# Patient Record
Sex: Female | Born: 1980 | Race: White | Hispanic: No | Marital: Single | Smoking: Never smoker
Health system: Southern US, Community
[De-identification: ages and names within clinical notes are randomized; demographics above are authoritative.]

## PROBLEM LIST (undated history)

## (undated) DIAGNOSIS — E039 Hypothyroidism, unspecified: Secondary | ICD-10-CM

## (undated) DIAGNOSIS — Z973 Presence of spectacles and contact lenses: Secondary | ICD-10-CM

## (undated) DIAGNOSIS — Z87410 Personal history of cervical dysplasia: Secondary | ICD-10-CM

## (undated) DIAGNOSIS — N939 Abnormal uterine and vaginal bleeding, unspecified: Secondary | ICD-10-CM

## (undated) DIAGNOSIS — T7840XA Allergy, unspecified, initial encounter: Secondary | ICD-10-CM

## (undated) DIAGNOSIS — J309 Allergic rhinitis, unspecified: Secondary | ICD-10-CM

## (undated) DIAGNOSIS — Z8619 Personal history of other infectious and parasitic diseases: Secondary | ICD-10-CM

## (undated) DIAGNOSIS — R87619 Unspecified abnormal cytological findings in specimens from cervix uteri: Secondary | ICD-10-CM

## (undated) HISTORY — PX: LYMPH NODE DISSECTION: SHX5087

## (undated) HISTORY — DX: Unspecified abnormal cytological findings in specimens from cervix uteri: R87.619

## (undated) HISTORY — PX: TONSILLECTOMY: SUR1361

## (undated) HISTORY — PX: COLPOSCOPY: SHX161

## (undated) HISTORY — PX: BREAST REDUCTION SURGERY: SHX8

## (undated) HISTORY — DX: Hypothyroidism, unspecified: E03.9

## (undated) HISTORY — PX: ADENOIDECTOMY: SUR15

## (undated) HISTORY — DX: Personal history of other infectious and parasitic diseases: Z86.19

## (undated) HISTORY — DX: Allergy, unspecified, initial encounter: T78.40XA

---
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
## 1999-12-06 HISTORY — PX: LYMPH NODE DISSECTION: SHX5087

## 2002-12-05 HISTORY — PX: CERVICAL BIOPSY  W/ LOOP ELECTRODE EXCISION: SUR135

## 2011-05-07 ENCOUNTER — Ambulatory Visit
Admission: RE | Admit: 2011-05-07 | Discharge: 2011-05-07 | Disposition: A | Discharge status: Home / Self Care | Payer: Commercial Managed Care - PPO | Source: Ambulatory Visit | Attending: Emergency Medicine | Admitting: Emergency Medicine

## 2011-05-07 ENCOUNTER — Other Ambulatory Visit: Payer: Self-pay | Admitting: Emergency Medicine

## 2011-05-07 ENCOUNTER — Inpatient Hospital Stay (INDEPENDENT_AMBULATORY_CARE_PROVIDER_SITE_OTHER)
Admission: RE | Admit: 2011-05-07 | Discharge: 2011-05-07 | Disposition: A | Discharge status: Home / Self Care | Payer: Commercial Managed Care - PPO | Source: Ambulatory Visit | Attending: Emergency Medicine | Admitting: Emergency Medicine

## 2011-05-07 ENCOUNTER — Encounter: Payer: Self-pay | Admitting: Emergency Medicine

## 2011-05-07 DIAGNOSIS — M79609 Pain in unspecified limb: Secondary | ICD-10-CM

## 2011-05-07 DIAGNOSIS — J45909 Unspecified asthma, uncomplicated: Secondary | ICD-10-CM | POA: Insufficient documentation

## 2011-11-07 NOTE — Progress Notes (Signed)
Summary: FOOT PAIN(rm2)   Vital Signs:  Patient Profile:   30 Years Old Female CC:      foot pain O2 treatment:    Room Air (left arm) Cuff size:   regular  Vitals Entered By: Linton Flemings RN (May 07, 2011 3:36 PM)                  Updated Prior Medication List: VENLAFAXINE HCL 37.5 MG TABS (VENLAFAXINE HCL) daily SPRINTEC 28 0.25-35 MG-MCG TABS (NORGESTIMATE-ETH ESTRADIOL) daily ALBUTEROL SULFATE (2.5 MG/3ML) 0.083% NEBU (ALBUTEROL SULFATE) as needed  Current Allergies: ! PCNsHistory of Present Illness History from: patient Chief Complaint: foot pain History of Present Illness: Was walking about 4-5 days on an uneven surface and twisted her foot.  Still has pain and was wanting an Xray.  Pain located on top and outside of her foot.  Perhaps small amount of swelling.  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 Using ibuprofen which is helping.  REVIEW OF SYSTEMS Constitutional Symptoms      Denies fever, chills, night sweats, weight loss, weight gain, and fatigue.  Eyes       Denies change in vision, eye pain, eye discharge, glasses, contact lenses, and eye surgery. Ear/Nose/Throat/Mouth       Denies hearing loss/aids, change in hearing, ear pain, ear discharge, dizziness, frequent runny nose, frequent nose bleeds, sinus problems, sore throat, hoarseness, and tooth pain or bleeding.  Respiratory       Denies dry cough, productive cough, wheezing, shortness of breath, asthma, bronchitis, and emphysema/COPD.  Cardiovascular       Denies murmurs, chest pain, and tires easily with exhertion.    Gastrointestinal       Denies stomach pain, nausea/vomiting, diarrhea, constipation, blood in bowel movements, and indigestion. Genitourniary       Denies painful urination, kidney stones, and loss of urinary control. Neurological       Denies paralysis, seizures, and fainting/blackouts. Musculoskeletal       Complains of muscle pain and joint pain.      Denies  joint stiffness, decreased range of motion, redness, swelling, muscle weakness, and gout.      Comments: foot pain Skin       Denies bruising, unusual mles/lumps or sores, and hair/skin or nail changes.  Psych       Denies mood changes, temper/anger issues, anxiety/stress, speech problems, depression, and sleep problems. Other Comments: twisted foot on Tuesday   Past History:  Past Medical History: Asthma  Past Surgical History: T&A wisdom teeth breast reduction lymphnoid removed  Family History: Family History Hypertension  Social History: smoke-no alcohol-yes rec.drugs-no Physical Exam General appearance: well developed, well nourished, no acute distress MSE: oriented to time, place, and person L foot/ ankle: FROM, full strength, resisted motions not painful.  No TTP medial/lateral malleolus, navicular, calcaneus, Achilles, or proximal fibula.  +TTP base of 5th and prox 3rd and 4th MT.  No swelling.  No ecchymoses.   Distal NV status intact. Assessment New Problems: ASTHMA (ICD-493.90) FOOT PAIN (ICD-729.5)   Plan New Orders: New Patient Level III [99203] T-DG Foot Complete*L* [73630] Planning Comments:   Xray obtained and read by radiology as normal.  Encourage rest, elevation, Ibuprofen as needed, ice.  Should use firm-soled shoe or boot (she already has one).  Should gradually be getting better over the next 1-2 weeks.  If not, then should schedule appt with sports medicine or ortho.   The patient and/or  caregiver has been counseled thoroughly with regard to medications prescribed including dosage, schedule, interactions, rationale for use, and possible side effects and they verbalize understanding.  Diagnoses and expected course of recovery discussed and will return if not improved as expected or if the condition worsens. Patient and/or caregiver verbalized understanding.   Orders Added: 1)  New Patient Level III [99203] 2)  T-DG Foot Complete*L* [40981]

## 2011-11-25 ENCOUNTER — Encounter: Payer: Self-pay | Admitting: *Deleted

## 2011-11-25 ENCOUNTER — Emergency Department
Admission: EM | Admit: 2011-11-25 | Discharge: 2011-11-25 | Disposition: A | Discharge status: Home / Self Care | Payer: Managed Care, Other (non HMO) | Source: Home / Self Care | Attending: Emergency Medicine | Admitting: Emergency Medicine

## 2011-11-25 DIAGNOSIS — J069 Acute upper respiratory infection, unspecified: Secondary | ICD-10-CM

## 2011-11-25 DIAGNOSIS — J029 Acute pharyngitis, unspecified: Secondary | ICD-10-CM

## 2011-11-25 LAB — POCT RAPID STREP A (OFFICE): Rapid Strep A Screen: NEGATIVE

## 2011-11-25 MED ORDER — AZITHROMYCIN 250 MG PO TABS: ORAL_TABLET | ORAL | Status: AC

## 2011-11-25 NOTE — ED Notes (Signed)
Patient c/o body aches, dry cough, HA, chills, and ear pain x 3 days. She had a flu shot 3 weeks ago.

## 2011-11-25 NOTE — ED Provider Notes (Signed)
History     CSN: [id number]  Arrival date & time 11/25/11  9147   First MD Initiated Contact with Patient 11/25/11 1838      No chief complaint on file.   (Consider location/radiation/quality/duration/timing/severity/associated sxs/prior treatment) HPI Tara Rosales is a 30 y.o. female who complains of onset of cold symptoms for 3 days. She has had the flu shot.  +sore throat +cough No pleuritic pain No wheezing + nasal congestion + post-nasal drainage + sinus pain/pressure No chest congestion No itchy/red eyes No earache No hemoptysis No SOB No chills/sweats No fever No nausea No vomiting No abdominal pain No diarrhea No skin rashes + fatigue No myalgias + headache    No past medical history on file.  No past surgical history on file.  No family history on file.  History  Substance Use Topics  . Smoking status: Not on file  . Smokeless tobacco: Not on file  . Alcohol Use: Not on file    OB History    No data available      Review of Systems  Allergies  Penicillins  Home Medications  No current outpatient prescriptions on file.  There were no vitals taken for this visit.  Physical Exam  Nursing note and vitals reviewed. Constitutional: She is oriented to person, place, and time. She appears well-developed and well-nourished.  HENT:  Head: Normocephalic and atraumatic.  Right Ear: Tympanic membrane, external ear and ear canal normal.  Left Ear: Tympanic membrane, external ear and ear canal normal.  Nose: Mucosal edema and rhinorrhea present.  Mouth/Throat: Posterior oropharyngeal erythema present. No oropharyngeal exudate or posterior oropharyngeal edema.  Eyes: No scleral icterus.  Neck: Neck supple.  Cardiovascular: Regular rhythm and normal heart sounds.   Pulmonary/Chest: Effort normal and breath sounds normal. No respiratory distress.  Neurological: She is alert and oriented to person, place, and time.  Skin: Skin is warm and dry.    Psychiatric: She has a normal mood and affect. Her speech is normal.    ED Course  Procedures (including critical care time)  Labs Reviewed - No data to display No results found.   No diagnosis found.    MDM  1)  I do not feel this is flu like. However this is most likely viral. The rapid strep test is negative. I did give her a prescription for Z-Pak but I told her to hold her for a couple days since is she will likely get better anyway just symptomatic treatment. 2)  Use nasal saline solution (over the counter) at least 3 times a day. 3)  Use over the counter decongestants like Zyrtec-D every 12 hours as needed to help with congestion.  If you have hypertension, do not take medicines with sudafed.  4)  Can take tylenol every 6 hours or motrin every 8 hours for pain or fever. 5)  Follow up with your primary doctor if no improvement in 5-7 days, sooner if increasing pain, fever, or new symptoms.        Lily Kocher, MD 11/25/11 [phone]

## 2012-06-15 ENCOUNTER — Emergency Department
Admission: EM | Admit: 2012-06-15 | Discharge: 2012-06-15 | Disposition: A | Discharge status: Home / Self Care | Payer: Managed Care, Other (non HMO) | Source: Home / Self Care

## 2012-06-15 ENCOUNTER — Emergency Department (INDEPENDENT_AMBULATORY_CARE_PROVIDER_SITE_OTHER): Payer: Managed Care, Other (non HMO)

## 2012-06-15 ENCOUNTER — Encounter: Payer: Self-pay | Admitting: Emergency Medicine

## 2012-06-15 ENCOUNTER — Emergency Department
Admission: EM | Admit: 2012-06-15 | Discharge: 2012-06-15 | Discharge status: Absent without leave | Payer: Self-pay | Source: Home / Self Care

## 2012-06-15 DIAGNOSIS — M25579 Pain in unspecified ankle and joints of unspecified foot: Secondary | ICD-10-CM

## 2012-06-15 DIAGNOSIS — S93409A Sprain of unspecified ligament of unspecified ankle, initial encounter: Secondary | ICD-10-CM

## 2012-06-15 DIAGNOSIS — M79609 Pain in unspecified limb: Secondary | ICD-10-CM

## 2012-06-15 DIAGNOSIS — M79673 Pain in unspecified foot: Secondary | ICD-10-CM

## 2012-06-15 NOTE — ED Provider Notes (Signed)
History     CSN: [id number]  Arrival date & time 06/15/12  1552   First MD Initiated Contact with Patient 06/15/12 1614      Chief Complaint  Patient presents with  . Foot Pain   Patient is a 31 y.o. female presenting with lower extremity pain. The history is provided by the patient.  Foot Pain This is a new problem. Episode onset: 2 days ago: Pt was jogging along uneven path.. Pt had repetitve episodes of rolling ankle along uneven pavement.  Had significant ankle and foot pai afterwards. Has had lateral ankle/foot pain since this point with swelling. Has been able to bear weight.  The problem has not changed since onset.The symptoms are aggravated by standing. The symptoms are relieved by ice and NSAIDs. The treatment provided mild relief.    History reviewed. No pertinent past medical history.  Past Surgical History  Procedure Date  . Tonsillectomy   . Breast reduction surgery     No family history on file.  History  Substance Use Topics  . Smoking status: Never Smoker   . Smokeless tobacco: Not on file  . Alcohol Use: Yes    OB History    Grav Para Term Preterm Abortions TAB SAB Ect Mult Living                  Review of Systems  All other systems reviewed and are negative.    Allergies  Penicillins and Sulfur  Home Medications   Current Outpatient Rx  Name Route Sig Dispense Refill  . NORETHINDRON-ETHINYL ESTRAD-FE 1-20/1-30/1-35 MG-MCG PO TABS Oral Take 1 tablet by mouth daily.        BP 110/74  Pulse 78  Temp 98.1 F (36.7 C) (Oral)  Resp 16  Ht 5\' 8"  (1.727 m)  Wt 180 lb (81.647 kg)  BMI 27.37 kg/m2  SpO2 100%  LMP 05/28/2012  Physical Exam  Constitutional: She appears well-developed and well-nourished.  HENT:  Head: Normocephalic and atraumatic.  Eyes: Conjunctivae are normal. Pupils are equal, round, and reactive to light.  Neck: Normal range of motion. Neck supple.  Cardiovascular: Normal rate.   Musculoskeletal:       Ankle: No  visible erythema or swelling. Decreased ROM with resisted ankle eversion Strength is 5/5 in all directions. Stable lateral and medial ligaments; squeeze test and kleiger test unremarkable;  Talar dome mildly tender.  No pain at base of 5th MT; No tenderness over cuboid; No tenderness over N spot or navicular prominence + tenderness of lateral malleolus + mild tenderness over aspect of 4th-5th metatarsal No sign of peroneal tendon subluxations; Negative tarsal tunnel tinel's Able to walk 4 steps.     ED Course  Procedures (including critical care time)  Labs Reviewed - No data to display Dg Ankle Complete Left  06/15/2012  *RADIOLOGY REPORT*  Clinical Data: Pain in the left ankle for 2 days  LEFT ANKLE COMPLETE - 3+ VIEW  Comparison: None.  Findings: The ankle joint appears normal.  Alignment is normal.  No fracture is seen.  IMPRESSION: Negative.  Original Report Authenticated By: Juline Patch, M.D.   Dg Foot Complete Left  06/15/2012  *RADIOLOGY REPORT*  Clinical Data: Lateral left foot pain.  LEFT FOOT - COMPLETE 3+ VIEW  Comparison: 05/07/2011.  Findings: No acute osseous or joint abnormality.  IMPRESSION: No acute osseous or joint abnormality.  Original Report Authenticated By: Reyes Ivan, M.D.     No diagnosis found.  MDM  L mid ankle sprain. L foot pain.   Xrays negative for fracture.  RICE, NSAIDs.  Ankle brace and post op shoe.  Discussed general red flags and avoid prolonged standing (pt works as Producer, television/film/video).  Handout given.  Follow up as needed.     The patient and/or caregiver has been counseled thoroughly with regard to treatment plan and/or medications prescribed including dosage, schedule, interactions, rationale for use, and possible side effects and they verbalize understanding. Diagnoses and expected course of recovery discussed and will return if not improved as expected or if the condition worsens. Patient and/or caregiver verbalized  understanding.             Floydene Flock, MD 06/15/12 1713  Floydene Flock, MD 06/15/12 1714

## 2012-06-15 NOTE — ED Notes (Signed)
Twisted left foot while running 3 days ago and has experienced increase in pain upon weight bearing and some edema.

## 2012-06-15 NOTE — ED Provider Notes (Signed)
Agree with exam, assessment, and plan.   Lattie Haw, MD 06/15/12 2234

## 2012-07-18 ENCOUNTER — Encounter: Payer: Self-pay | Admitting: Emergency Medicine

## 2012-07-18 ENCOUNTER — Emergency Department
Admission: EM | Admit: 2012-07-18 | Discharge: 2012-07-18 | Disposition: A | Discharge status: Home / Self Care | Payer: Managed Care, Other (non HMO) | Source: Home / Self Care | Attending: Family Medicine | Admitting: Family Medicine

## 2012-07-18 ENCOUNTER — Emergency Department (INDEPENDENT_AMBULATORY_CARE_PROVIDER_SITE_OTHER): Payer: Managed Care, Other (non HMO)

## 2012-07-18 DIAGNOSIS — M79609 Pain in unspecified limb: Secondary | ICD-10-CM

## 2012-07-18 DIAGNOSIS — M25569 Pain in unspecified knee: Secondary | ICD-10-CM

## 2012-07-18 DIAGNOSIS — S8392XA Sprain of unspecified site of left knee, initial encounter: Secondary | ICD-10-CM

## 2012-07-18 DIAGNOSIS — IMO0002 Reserved for concepts with insufficient information to code with codable children: Secondary | ICD-10-CM

## 2012-07-18 DIAGNOSIS — S8990XA Unspecified injury of unspecified lower leg, initial encounter: Secondary | ICD-10-CM

## 2012-07-18 DIAGNOSIS — W19XXXA Unspecified fall, initial encounter: Secondary | ICD-10-CM

## 2012-07-18 DIAGNOSIS — M79652 Pain in left thigh: Secondary | ICD-10-CM

## 2012-07-18 MED ORDER — HYDROCODONE-ACETAMINOPHEN 5-300 MG PO TABS: ORAL_TABLET | ORAL | Status: DC

## 2012-07-18 NOTE — ED Notes (Signed)
Left leg injury x 4 days ago slipped on ladder getting into pool

## 2012-07-18 NOTE — ED Provider Notes (Signed)
History     CSN: [id number]  Arrival date & time 07/18/12  1116   First MD Initiated Contact with Patient 07/18/12 1133      Chief Complaint  Patient presents with  . Leg Injury     HPI Comments: Left leg injury x 4 days ago slipped on ladder getting into pool.  Patient reports persistent pain and swelling in her left lateral thigh and knee.  No lower leg pain below the knee.  She notes pain with standing and ambulation.  Patient is a 31 y.o. female presenting with leg pain. The history is provided by the patient.  Leg Pain  Incident onset: 4 days ago. The incident occurred at the pool. The injury mechanism was a fall. The pain is present in the left knee and left leg. The quality of the pain is described as throbbing. The pain is at a severity of 7/10. The pain is moderate. The pain has been constant since onset. Pertinent negatives include no numbness, no inability to bear weight, no loss of motion, no muscle weakness, no loss of sensation and no tingling. The symptoms are aggravated by bearing weight. She has tried NSAIDs for the symptoms. The treatment provided mild relief.    History reviewed. No pertinent past medical history.  Past Surgical History  Procedure Date  . Tonsillectomy   . Breast reduction surgery     History reviewed. No pertinent family history.  History  Substance Use Topics  . Smoking status: Never Smoker   . Smokeless tobacco: Not on file  . Alcohol Use: Yes    OB History    Grav Para Term Preterm Abortions TAB SAB Ect Mult Living                  Review of Systems  Neurological: Negative for tingling and numbness.  All other systems reviewed and are negative.    Allergies  Penicillins and Sulfur  Home Medications   Current Outpatient Rx  Name Route Sig Dispense Refill  . HYDROCODONE-ACETAMINOPHEN 5-300 MG PO TABS  Take one tab by mouth at bedtime PRN pain 10 each 0  . NORETHINDRON-ETHINYL ESTRAD-FE 1-20/1-30/1-35 MG-MCG PO TABS Oral  Take 1 tablet by mouth daily.        BP 125/84  Pulse 85  Temp 98.4 F (36.9 C) (Oral)  Resp 14  Ht 5\' 8"  (1.727 m)  Wt 198 lb (89.812 kg)  BMI 30.11 kg/m2  SpO2 98%  LMP 07/03/2012  Physical Exam  Nursing note and vitals reviewed. Constitutional: She is oriented to person, place, and time. She appears well-developed and well-nourished. No distress.  Eyes: Pupils are equal, round, and reactive to light.  Cardiovascular: Normal heart sounds.   Pulmonary/Chest: Breath sounds normal.  Musculoskeletal: She exhibits tenderness. She exhibits no edema.       Left knee: She exhibits decreased range of motion, ecchymosis and bony tenderness. She exhibits no swelling, no effusion, no deformity, no laceration, no erythema, normal alignment, no LCL laxity, normal patellar mobility, normal meniscus and no MCL laxity. tenderness found. Lateral joint line and LCL tenderness noted. No medial joint line, no MCL and no patellar tendon tenderness noted.       Legs:      As noted on diagram, there is superficial tenderness and superficial ecchymosis over the left lateral distal quadriceps area.  Tenderness over left patella and left lateral joint line.  No calf tenderness or swelling.  Negative McMurray test and drawer test left  knee.  Distal Neurovascular function is intact.   Neurological: She is alert and oriented to person, place, and time.  Skin: Skin is warm and dry. No rash noted. No erythema.    ED Course  Procedures  none  Labs Reviewed - No data to display Dg Knee Complete 4 Views Left  07/18/2012  *RADIOLOGY REPORT*  Clinical Data: Injury 3 days ago.  Pain.  LEFT KNEE - COMPLETE 4+ VIEW  Comparison: None.  Findings: No fracture or dislocation.  IMPRESSION: No fracture.  Original Report Authenticated By: Fuller Canada, M.D.     1. Left thigh pain/contusion  2. Left knee sprain, non-specific       MDM   Ace wrap applied.  Vicodin for pain at night. Use crutches for about 5 days.   Wear ace wrap until swelling decreases.  Apply ice pack several times daily.  Elevate leg.  Take Ibuprofen 200mg , 4 tabs every 8 hours with food.  Begin exercises as per instruction sheet ([facility] information and instruction handout given) Red flags discussed (i.e. Increasing pain, swelling, heat:  Possible DVT)  Followup with [facility] Medicine Clinic if not improving about two weeks.         Lattie Haw, MD 07/18/12 [phone]

## 2012-07-21 ENCOUNTER — Telehealth: Payer: Self-pay

## 2012-07-21 NOTE — ED Notes (Signed)
Left a message on voice mail asking how patient is feeling and advising to call back with any questions or concerns.  

## 2012-12-03 ENCOUNTER — Emergency Department
Admission: EM | Admit: 2012-12-03 | Discharge: 2012-12-03 | Disposition: A | Discharge status: Home / Self Care | Payer: Managed Care, Other (non HMO) | Source: Home / Self Care | Attending: Family Medicine | Admitting: Family Medicine

## 2012-12-03 ENCOUNTER — Encounter: Payer: Self-pay | Admitting: Emergency Medicine

## 2012-12-03 DIAGNOSIS — J029 Acute pharyngitis, unspecified: Secondary | ICD-10-CM

## 2012-12-03 DIAGNOSIS — J069 Acute upper respiratory infection, unspecified: Secondary | ICD-10-CM

## 2012-12-03 LAB — POCT RAPID STREP A (OFFICE): Rapid Strep A Screen: NEGATIVE

## 2012-12-03 MED ORDER — AZITHROMYCIN 250 MG PO TABS: ORAL_TABLET | ORAL | Status: DC

## 2012-12-03 MED ORDER — PREDNISONE 20 MG PO TABS: 20.0000 mg | ORAL_TABLET | Freq: Two times a day (BID) | ORAL | Status: DC

## 2012-12-03 MED ORDER — BENZONATATE 200 MG PO CAPS: 200.0000 mg | ORAL_CAPSULE | Freq: Every day | ORAL | Status: DC

## 2012-12-03 NOTE — ED Notes (Signed)
Sore throat, ears hurt x 1 day

## 2012-12-03 NOTE — ED Provider Notes (Signed)
History     CSN: [id number]  Arrival date & time 12/03/12  1029   First MD Initiated Contact with Patient 12/03/12 1119      Chief Complaint  Patient presents with  . Sore Throat     HPI Comments: Patient states that she has been fatigued for three days, and last night developed a sore throat, worse this morning.  She has had mild myalgias.  She normally has sinus congestion year round, now somewhat increased with post-nasal drainage.  She has developed a mild left earache and noted small amount of drainage from her left ear.  No cough. She states that she has had multiple ear infections in the past.  The history is provided by the patient.    History reviewed. No pertinent past medical history.  Past Surgical History  Procedure Date  . Tonsillectomy   . Breast reduction surgery     No family history on file.  History  Substance Use Topics  . Smoking status: Never Smoker   . Smokeless tobacco: Not on file  . Alcohol Use: Yes    OB History    Grav Para Term Preterm Abortions TAB SAB Ect Mult Living                  Review of Systems + sore throat + cough No pleuritic pain No wheezing + nasal congestion + post-nasal drainage No sinus pain/pressure No itchy/red eyes + left earache with drainage from left ear No hemoptysis No SOB No fever, + chills No nausea No vomiting No abdominal pain No diarrhea No urinary symptoms No skin rashes + fatigue No myalgias + headache Used OTC meds without relief  Allergies  Penicillins and Sulfur  Home Medications   Current Outpatient Rx  Name  Route  Sig  Dispense  Refill  . AZITHROMYCIN 250 MG PO TABS      Take 2 tabs today; then begin one tab once daily for 4 more days. (Rx void after 12/11/12)   6 each   0   . BENZONATATE 200 MG PO CAPS   Oral   Take 1 capsule (200 mg total) by mouth at bedtime. Take as needed for cough   12 capsule   0   . HYDROCODONE-ACETAMINOPHEN 5-300 MG PO TABS      Take one tab  by mouth at bedtime PRN pain   10 each   0   . NORETHINDRON-ETHINYL ESTRAD-FE 1-20/1-30/1-35 MG-MCG PO TABS   Oral   Take 1 tablet by mouth daily.           Marland Kitchen PREDNISONE 20 MG PO TABS   Oral   Take 1 tablet (20 mg total) by mouth 2 (two) times daily.   10 tablet   0     BP 130/81  Pulse 78  Temp 98.4 F (36.9 C) (Oral)  Resp 14  Ht 5\' 8"  (1.727 m)  Wt 215 lb (97.523 kg)  BMI 32.69 kg/m2  SpO2 97%  LMP 11/30/2012  Physical Exam Nursing notes and Vital Signs reviewed. Appearance:  Patient appears stated age, and in no acute distress.  Patient is obese (BMI 32.7) Eyes:  Pupils are equal, round, and reactive to light and accomodation.  Extraocular movement is intact.  Conjunctivae are not inflamed  Ears:  Canals normal.  Tympanic membranes normal.  Nose:  Mildly congested turbinates.  No sinus tenderness.   Pharynx:   Minimal erythema Neck:  Supple.  Slightly tender shotty posterior nodes are  palpated bilaterally  Lungs:  Clear to auscultation.  Breath sounds are equal. Chest:  Distinct tenderness to palpation over the mid-sternum.   Heart:  Regular rate and rhythm without murmurs, rubs, or gallops.  Abdomen:  Nontender without masses or hepatosplenomegaly.  Bowel sounds are present.  No CVA or flank tenderness.  Extremities:  No edema.  No calf tenderness Skin:  No rash present.   ED Course  Procedures none   Labs Reviewed  POCT RAPID STREP A (OFFICE) negative      1. Acute pharyngitis   2. Acute upper respiratory infections of unspecified site; suspect viral URI       MDM  There is no evidence of bacterial infection today.  Treat symptomatically for now  Begin prednisone burst.  Prescription written for Benzonatate (Tessalon) to take at bedtime for night-time cough.  Take Mucinex D (guaifenesin with decongestant) twice daily for congestion.  Increase fluid intake, rest. May use Afrin nasal spray (or generic oxymetazoline) twice daily for about 5 days.   Also recommend using saline nasal spray several times daily and saline nasal irrigation (AYR is a common brand) Stop all antihistamines for now, and other non-prescription cough/cold preparations. May take Ibuprofen 200mg , 4 tabs every 8 hours with food for chest/sternum discomfort. Begin Azithromycin if not improving about 5 days or if persistent fever develops (Given a prescription to hold, with an expiration date)  Follow-up with family doctor if not improving 7 to 10 days.        Lattie Haw, MD 12/04/12 [phone]

## 2013-01-25 LAB — BASIC METABOLIC PANEL
CREATININE: 0.8 mg/dL (ref 0.5–1.1)
POTASSIUM: 3.8 mmol/L (ref 3.4–5.3)

## 2013-02-10 ENCOUNTER — Emergency Department
Admission: EM | Admit: 2013-02-10 | Discharge: 2013-02-10 | Disposition: A | Discharge status: Home / Self Care | Payer: Managed Care, Other (non HMO) | Source: Home / Self Care | Attending: Family Medicine | Admitting: Family Medicine

## 2013-02-10 DIAGNOSIS — M25562 Pain in left knee: Secondary | ICD-10-CM

## 2013-02-10 MED ORDER — MELOXICAM 15 MG PO TABS: 15.0000 mg | ORAL_TABLET | Freq: Every day | ORAL | Status: DC

## 2013-02-10 NOTE — ED Notes (Signed)
Pain to left knee on and off x one week, running today and heard a loud pop now having shooting pain

## 2013-02-10 NOTE — ED Provider Notes (Signed)
History     CSN: [id number]  Arrival date & time 02/10/13  1526   First MD Initiated Contact with Patient 02/10/13 1544      Chief Complaint  Patient presents with  . Knee Pain   Patient is a 32 y.o. female presenting with knee pain. The history is provided by the patient.  Knee Pain Location:  Knee Pain details:    Quality:  Aching   Radiates to:  Does not radiate   Severity:  Moderate   Onset quality: has had dull anterior knee pain over last week. Was running today in preparation for 5k and felt pop in anterior knee. Has had anterior knee pain since this point. No swelling.    Timing:  Constant Chronicity:  Recurrent (Hash had recurrent knee issues in the past including knee drainage and injections in high school. ) Dislocation: no   Foreign body present:  No foreign bodies Relieved by:  Rest    No past medical history on file.  Past Surgical History  Procedure Laterality Date  . Tonsillectomy    . Breast reduction surgery      No family history on file.  History  Substance Use Topics  . Smoking status: Never Smoker   . Smokeless tobacco: Not on file  . Alcohol Use: Yes    OB History   Grav Para Term Preterm Abortions TAB SAB Ect Mult Living                  Review of Systems  All other systems reviewed and are negative.    Allergies  Penicillins and Sulfur  Home Medications   Current Outpatient Rx  Name  Route  Sig  Dispense  Refill  . azithromycin (ZITHROMAX Z-PAK) 250 MG tablet      Take 2 tabs today; then begin one tab once daily for 4 more days. (Rx void after 12/11/12)   6 each   0   . benzonatate (TESSALON) 200 MG capsule   Oral   Take 1 capsule (200 mg total) by mouth at bedtime. Take as needed for cough   12 capsule   0   . Hydrocodone-Acetaminophen 5-300 MG TABS      Take one tab by mouth at bedtime PRN pain   10 each   0   . norethindrone-ethinyl estradiol-iron (ESTROSTEP FE,TILIA FE,TRI-LEGEST FE) 1-20/1-30/1-35 MG-MCG  tablet   Oral   Take 1 tablet by mouth daily.           . predniSONE (DELTASONE) 20 MG tablet   Oral   Take 1 tablet (20 mg total) by mouth 2 (two) times daily.   10 tablet   0     BP 99/69  Pulse 102  Temp(Src) 97.8 F (36.6 C) (Oral)  Ht 5\' 8"  (1.727 m)  Wt 210 lb (95.255 kg)  BMI 31.94 kg/m2  SpO2 100%  LMP 02/03/2013  Physical Exam  Constitutional: She appears well-developed and well-nourished.  HENT:  Head: Normocephalic and atraumatic.  Eyes: Conjunctivae are normal. Pupils are equal, round, and reactive to light.  Neck: Normal range of motion.  Cardiovascular: Normal rate and intact distal pulses.   Pulmonary/Chest: Effort normal.  Abdominal: Soft.  Musculoskeletal:       Legs: Neurological: She is alert.  Skin: Skin is warm.    ED Course  Procedures (including critical care time)  Labs Reviewed - No data to display No results found.   1. Knee pain, left  MDM  Exam most consistent with patellar tendinitis versus pes anserine bursitis. Will place patient in knee brace. Rice and NSAIDs. Overall no clinical indications for imaging. Negative anterior drawer. No knee swelling. Plan followup with sports medicine the next 1-2 weeks for general reevaluation symptoms. Discussed musculoskeletal and general red flags. Followup as needed.     The patient and/or caregiver has been counseled thoroughly with regard to treatment plan and/or medications prescribed including dosage, schedule, interactions, rationale for use, and possible side effects and they verbalize understanding. Diagnoses and expected course of recovery discussed and will return if not improved as expected or if the condition worsens. Patient and/or caregiver verbalized understanding.             Doree Albee, MD 02/10/13 [phone]

## 2013-11-29 ENCOUNTER — Encounter: Payer: Self-pay | Admitting: Emergency Medicine

## 2013-11-29 ENCOUNTER — Emergency Department
Admission: EM | Admit: 2013-11-29 | Discharge: 2013-11-29 | Disposition: A | Discharge status: Home / Self Care | Payer: Managed Care, Other (non HMO) | Source: Home / Self Care | Attending: Family Medicine | Admitting: Family Medicine

## 2013-11-29 DIAGNOSIS — R51 Headache: Secondary | ICD-10-CM

## 2013-11-29 DIAGNOSIS — S0003XA Contusion of scalp, initial encounter: Secondary | ICD-10-CM

## 2013-11-29 DIAGNOSIS — S0093XA Contusion of unspecified part of head, initial encounter: Secondary | ICD-10-CM
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
## 2014-07-24 ENCOUNTER — Ambulatory Visit (INDEPENDENT_AMBULATORY_CARE_PROVIDER_SITE_OTHER): Payer: Managed Care, Other (non HMO) | Admitting: Certified Nurse Midwife

## 2014-07-24 ENCOUNTER — Encounter: Payer: Self-pay | Admitting: Certified Nurse Midwife

## 2014-07-24 VITALS — BP 104/64 | HR 60 | Resp 16 | Ht 67.75 in | Wt 159.0 lb

## 2014-07-24 DIAGNOSIS — Z0000 Encounter for general adult medical examination without abnormal findings: Secondary | ICD-10-CM

## 2014-07-24 DIAGNOSIS — Z01419 Encounter for gynecological examination (general) (routine) without abnormal findings: Secondary | ICD-10-CM

## 2014-07-24 DIAGNOSIS — Z124 Encounter for screening for malignant neoplasm of cervix: Secondary | ICD-10-CM

## 2014-07-24 DIAGNOSIS — R87612 Low grade squamous intraepithelial lesion on cytologic smear of cervix (LGSIL): Secondary | ICD-10-CM

## 2014-07-24 DIAGNOSIS — R87619 Unspecified abnormal cytological findings in specimens from cervix uteri: Secondary | ICD-10-CM | POA: Insufficient documentation

## 2014-07-24 LAB — POCT URINALYSIS DIPSTICK
BILIRUBIN UA: NEGATIVE
Glucose, UA: NEGATIVE
KETONES UA: NEGATIVE
Leukocytes, UA: NEGATIVE
Nitrite, UA: NEGATIVE
PH UA: 8
PROTEIN UA: NEGATIVE
RBC UA: NEGATIVE
Urobilinogen, UA: NEGATIVE

## 2014-07-24 LAB — HEMOGLOBIN, FINGERSTICK: Hemoglobin, fingerstick: 14.1 g/dL (ref 12.0–16.0)

## 2014-07-24 NOTE — Progress Notes (Addendum)
33 y.o. G0P0000 Single Caucasian Fe here to establish gyn care and  for annual exam. Periods normal except missed one in past year. Duration 3-4 days, occasional cramping, light to moderate.Condoms consistent when sexually active. Had STD screening in fall 2014. Dr. Lendon Colonel for PCP, prn..  No health issues today.  Patient's last menstrual period was 07/05/2014.          Sexually active: No.  The current method of family planning is abstinence.    Exercising: Yes.    run,weights,jumping rope,yoga Smoker:  no  Health Maintenance: Pap: 01-23-13 neg   History of abnormal pap with LEEP in 2004, Pap per records 2009 negative, 2010 LSIL with colpo ECC benign,2011 LSIL, 2/12 LSIL colpo no negative per patient,8/12 neg.,2/13 ASCUS HPVHR negative,  MMG:  none Colonoscopy:  none BMD:   none TDaP:  2007 Labs: Poct urine-ph 8.0, Hgb-14.1 Self breast exam: done monthly   reports that she has never smoked. She does not have any smokeless tobacco history on file. She reports that she drinks about 1 - 1.5 ounces of alcohol per week. She reports that she does not use illicit drugs.  Past Medical History  Diagnosis Date  . Abnormal Pap smear of cervix     Past Surgical History  Procedure Laterality Date  . Tonsillectomy    . Breast reduction surgery    . Lymph node dissection    . Colposcopy    . Cervical biopsy  w/ loop electrode excision  2004  . Wisdom tooth extraction  1995  . Adenoidectomy      Current Outpatient Prescriptions  Medication Sig Dispense Refill  . BIOTIN PO Take by mouth daily.      . Calcium Carbonate-Vitamin D (CALCIUM + D PO) Take by mouth daily.      . Cyanocobalamin (B-12 PO) Take by mouth daily. liquid      . Multiple Vitamins-Minerals (MULTIVITAMIN PO) Take by mouth daily.       No current facility-administered medications for this visit.    Family History  Problem Relation Age of Onset  . Hypertension Mother   . Hypertension Father   . Cancer Father      prostate  . Cancer Maternal Grandmother     rectal  . Diabetes Maternal Grandfather   . Breast cancer Paternal Grandmother   . Heart attack Paternal Grandfather     ROS:  Pertinent items are noted in HPI.  Otherwise, a comprehensive ROS was negative.  Exam:   BP 104/64  Pulse 60  Resp 16  Ht 5' 7.75" (1.721 m)  Wt 159 lb (72.122 kg)  BMI 24.35 kg/m2  LMP 07/05/2014 Height: 5' 7.75" (172.1 cm)  Ht Readings from Last 3 Encounters:  07/24/14 5' 7.75" (1.721 m)  11/29/13 5\' 8"  (1.727 m)  02/10/13 5\' 8"  (1.727 m)    General appearance: alert, cooperative and appears stated age Head: Normocephalic, without obvious abnormality, atraumatic Neck: no adenopathy, supple, symmetrical, trachea midline and thyroid normal to inspection and palpation Lungs: clear to auscultation bilaterally Breasts: normal appearance, no masses or tenderness, No nipple retraction or dimpling, No nipple discharge or bleeding, No axillary or supraclavicular adenopathy Heart: regular rate and rhythm Abdomen: soft, non-tender; no masses,  no organomegaly Extremities: extremities normal, atraumatic, no cyanosis or edema Skin: Skin color, texture, turgor normal. No rashes or lesions Lymph nodes: Cervical, supraclavicular, and axillary nodes normal. No abnormal inguinal nodes palpated Neurologic: Grossly normal   Pelvic: External genitalia:  no lesions  Urethra:  normal appearing urethra with no masses, tenderness or lesions              Bartholin's and Skene's: normal                 Vagina: normal appearing vagina with normal color and discharge, no lesions              Cervix: LEEP appearance, no lesions or tenderness              Pap taken: Yes.   Bimanual Exam:  Uterus:  normal size, contour, position, consistency, mobility, non-tender and anteverted              Adnexa: normal adnexa and no mass, fullness, tenderness               Rectovaginal: Confirms               Anus:  normal sphincter  tone, no lesions  A:  Well Woman with normal exam  Contraception condoms  History of abnormal pap with LEEP 2004, with last pap smear per record 2013 ASCUS -HPVHR, addendum with 2014 lab received Pap smear negative but HPVHR positive.  Screening labs today  P:   Reviewed health and wellness pertinent to exam  Stressed importance of yearly pap smears for 20 years past LEEP date. Patient feels pap was done with last aex, will request results.  Pap smear taken today with HPVHR  Labs: Lipid panel, TSH,CBC  counseled on breast self exam, STD prevention, HIV risk factors and prevention, adequate intake of calcium and vitamin D, diet and exercise  return annually or prn  An After Visit Summary was printed and given to the patient.

## 2014-07-24 NOTE — Progress Notes (Signed)
Reviewed personally.  M. Suzanne Esker Dever, MD.  

## 2014-07-24 NOTE — Patient Instructions (Signed)
General topics  Next pap or exam is  due in 1 year Take a Women's multivitamin Take 1200 mg. of calcium daily - prefer dietary If any concerns in interim to call back  Breast Self-Awareness Practicing breast self-awareness may pick up problems early, prevent significant medical complications, and possibly save your life. By practicing breast self-awareness, you can become familiar with how your breasts look and feel and if your breasts are changing. This allows you to notice changes early. It can also offer you some reassurance that your breast health is good. One way to learn what is normal for your breasts and whether your breasts are changing is to do a breast self-exam. If you find a lump or something that was not present in the past, it is best to contact your caregiver right away. Other findings that should be evaluated by your caregiver include nipple discharge, especially if it is bloody; skin changes or reddening; areas where the skin seems to be pulled in (retracted); or new lumps and bumps. Breast pain is seldom associated with cancer (malignancy), but should also be evaluated by a caregiver. BREAST SELF-EXAM The best time to examine your breasts is 5 7 days after your menstrual period is over.  ExitCare Patient Information 2013 [employer].   Exercise to Stay Healthy Exercise helps you become and stay healthy. EXERCISE IDEAS AND TIPS Choose exercises that:  You enjoy.  Fit into your day. You do not need to exercise really hard to be healthy. You can do exercises at a slow or medium level and stay healthy. You can:  Stretch before and after working out.  Try yoga, Pilates, or tai chi.  Lift weights.  Walk fast, swim, jog, run, climb stairs, bicycle, dance, or rollerskate.  Take aerobic classes. Exercises that burn about 150 calories:  Running 1  miles in 15 minutes.  Playing volleyball for 45 to 60 minutes.  Washing and waxing a car for 45 to 60  minutes.  Playing touch football for 45 minutes.  Walking 1  miles in 35 minutes.  Pushing a stroller 1  miles in 30 minutes.  Playing basketball for 30 minutes.  Raking leaves for 30 minutes.  Bicycling 5 miles in 30 minutes.  Walking 2 miles in 30 minutes.  Dancing for 30 minutes.  Shoveling snow for 15 minutes.  Swimming laps for 20 minutes.  Walking up stairs for 15 minutes.  Bicycling 4 miles in 15 minutes.  Gardening for 30 to 45 minutes.  Jumping rope for 15 minutes.  Washing windows or floors for 45 to 60 minutes. Document Released: 12/24/2010 Document Revised: 02/13/2012 Document Reviewed: 12/24/2010 ExitCare Patient Information 2013 [employer].   Other topics ( that may be useful information):    Sexually Transmitted Disease Sexually transmitted disease (STD) refers to any infection that is passed from person to person during sexual activity. This may happen by way of saliva, semen, blood, vaginal mucus, or urine. Common STDs include:  Gonorrhea.  Chlamydia.  Syphilis.  HIV/AIDS.  Genital herpes.  Hepatitis B and C.  Trichomonas.  Human papillomavirus (HPV).  Pubic lice. CAUSES  An STD may be spread by bacteria, virus, or parasite. A person can get an STD by:  Sexual intercourse with an infected person.  Sharing sex toys with an infected person.  Sharing needles with an infected person.  Having intimate contact with the genitals, mouth, or rectal areas of an infected person. SYMPTOMS  Some people may not have any symptoms, but   they can still pass the infection to others. Different STDs have different symptoms. Symptoms include:  Painful or bloody urination.  Pain in the pelvis, abdomen, vagina, anus, throat, or eyes.  Skin rash, itching, irritation, growths, or sores (lesions). These usually occur in the genital or anal area.  Abnormal vaginal discharge.  Penile discharge in men.  Soft, flesh-colored skin growths in the  genital or anal area.  Fever.  Pain or bleeding during sexual intercourse.  Swollen glands in the groin area.  Yellow skin and eyes (jaundice). This is seen with hepatitis. DIAGNOSIS  To make a diagnosis, your caregiver may:  Take a medical history.  Perform a physical exam.  Take a specimen (culture) to be examined.  Examine a sample of discharge under a microscope.  Perform blood test TREATMENT   Chlamydia, gonorrhea, trichomonas, and syphilis can be cured with antibiotic medicine.  Genital herpes, hepatitis, and HIV can be treated, but not cured, with prescribed medicines. The medicines will lessen the symptoms.  Genital warts from HPV can be treated with medicine or by freezing, burning (electrocautery), or surgery. Warts may come back.  HPV is a virus and cannot be cured with medicine or surgery.However, abnormal areas may be followed very closely by your caregiver and may be removed from the cervix, vagina, or vulva through office procedures or surgery. If your diagnosis is confirmed, your recent sexual partners need treatment. This is true even if they are symptom-free or have a negative culture or evaluation. They should not have sex until their caregiver says it is okay. HOME CARE INSTRUCTIONS  All sexual partners should be informed, tested, and treated for all STDs.  Take your antibiotics as directed. Finish them even if you start to feel better.  Only take over-the-counter or prescription medicines for pain, discomfort, or fever as directed by your caregiver.  Rest.  Eat a balanced diet and drink enough fluids to keep your urine clear or pale yellow.  Do not have sex until treatment is completed and you have followed up with your caregiver. STDs should be checked after treatment.  Keep all follow-up appointments, Pap tests, and blood tests as directed by your caregiver.  Only use latex condoms and water-soluble lubricants during sexual activity. Do not use  petroleum jelly or oils.  Avoid alcohol and illegal drugs.  Get vaccinated for HPV and hepatitis. If you have not received these vaccines in the past, talk to your caregiver about whether one or both might be right for you.  Avoid risky sex practices that can break the skin. The only way to avoid getting an STD is to avoid all sexual activity.Latex condoms and dental dams (for oral sex) will help lessen the risk of getting an STD, but will not completely eliminate the risk. SEEK MEDICAL CARE IF:   You have a fever.  You have any new or worsening symptoms. Document Released: 02/11/2003 Document Revised: 02/13/2012 Document Reviewed: 02/18/2011 [facility] Patient Information 2013 [employer].    Domestic Abuse You are being battered or abused if someone close to you hits, pushes, or physically hurts you in any way. You also are being abused if you are forced into activities. You are being sexually abused if you are forced to have sexual contact of any kind. You are being emotionally abused if you are made to feel worthless or if you are constantly threatened. It is important to remember that help is available. No one has the right to abuse you. PREVENTION OF FURTHER  ABUSE  Learn the warning signs of danger. This varies with situations but may include: the use of alcohol, threats, isolation from friends and family, or forced sexual contact. Leave if you feel that violence is going to occur.  If you are attacked or beaten, report it to the police so the abuse is documented. You do not have to press charges. The police can protect you while you or the attackers are leaving. Get the officer's name and badge number and a copy of the report.  Find someone you can trust and tell them what is happening to you: your caregiver, a nurse, clergy member, close friend or family member. Feeling ashamed is natural, but remember that you have done nothing wrong. No one deserves abuse. Document Released:  11/18/2000 Document Revised: 02/13/2012 Document Reviewed: 01/27/2011 Exit[employer] Patient Information 2013 [employer].    How Much is Too Much Alcohol? Drinking too much alcohol can cause injury, accidents, and health problems. These types of problems can include:   Car crashes.  Falls.  Family fighting (domestic violence).  Drowning.  Fights.  Injuries.  Burns.  Damage to certain organs.  Having a baby with birth defects. ONE DRINK CAN BE TOO MUCH WHEN YOU ARE:  Working.  Pregnant or breastfeeding.  Taking medicines. Ask your doctor.  Driving or planning to drive. If you or someone you know has a drinking problem, get help from a doctor.  Document Released: 09/17/2009 Document Revised: 02/13/2012 Document Reviewed: 09/17/2009 ExitCare Patient Information 2013 [employer].   Smoking Hazards Smoking cigarettes is extremely bad for your health. Tobacco smoke has over 200 known poisons in it. There are over 60 chemicals in tobacco smoke that cause cancer. Some of the chemicals found in cigarette smoke include:   Cyanide.  Benzene.  Formaldehyde.  Methanol (wood alcohol).  Acetylene (fuel used in welding torches).  Ammonia. Cigarette smoke also contains the poisonous gases nitrogen oxide and carbon monoxide.  Cigarette smokers have an increased risk of many serious medical problems and Smoking causes approximately:  90% of all lung cancer deaths in men.  80% of all lung cancer deaths in women.  90% of deaths from chronic obstructive lung disease. Compared with nonsmokers, smoking increases the risk of:  Coronary heart disease by 2 to 4 times.  Stroke by 2 to 4 times.  Men developing lung cancer by 23 times.  Women developing lung cancer by 13 times.  Dying from chronic obstructive lung diseases by 12 times.  . Smoking is the most preventable cause of death and disease in our society.  WHY IS SMOKING ADDICTIVE?  Nicotine is the chemical  agent in tobacco that is capable of causing addiction or dependence.  When you smoke and inhale, nicotine is absorbed rapidly into the bloodstream through your lungs. Nicotine absorbed through the lungs is capable of creating a powerful addiction. Both inhaled and non-inhaled nicotine may be addictive.  Addiction studies of cigarettes and spit tobacco show that addiction to nicotine occurs mainly during the teen years, when young people begin using tobacco products. WHAT ARE THE BENEFITS OF QUITTING?  There are many health benefits to quitting smoking.   Likelihood of developing cancer and heart disease decreases. Health improvements are seen almost immediately.  Blood pressure, pulse rate, and breathing patterns start returning to normal soon after quitting. QUITTING SMOKING   American Lung Association - 1-800-LUNGUSA  American Cancer Society - 1-800-ACS-2345 Document Released: 12/29/2004 Document Revised: 02/13/2012 Document Reviewed: 09/02/2009 ExitCare Patient Information 2013 [employer].   Stress Management Stress is a state of physical or mental tension that often results from changes in your life or normal routine. Some common causes of stress are:  Death of a loved one.  Injuries or severe illnesses.  Getting fired or changing jobs.  Moving into a new home. Other causes may be:  Sexual problems.  Business or financial losses.  Taking on a large debt.  Regular conflict with someone at home or at work.  Constant tiredness from lack of sleep. It is not just bad things that are stressful. It may be stressful to:  Win the lottery.  Get married.  Buy a new car. The amount of stress that can be easily tolerated varies from person to person. Changes generally cause stress, regardless of the types of change. Too much stress can affect your health. It may lead to physical or emotional problems. Too little stress (boredom) may also become stressful. SUGGESTIONS TO  REDUCE STRESS:  Talk things over with your family and friends. It often is helpful to share your concerns and worries. If you feel your problem is serious, you may want to get help from a professional counselor.  Consider your problems one at a time instead of lumping them all together. Trying to take care of everything at once may seem impossible. List all the things you need to do and then start with the most important one. Set a goal to accomplish 2 or 3 things each day. If you expect to do too many in a single day you will naturally fail, causing you to feel even more stressed.  Do not use alcohol or drugs to relieve stress. Although you may feel better for a short time, they do not remove the problems that caused the stress. They can also be habit forming.  Exercise regularly - at least 3 times per week. Physical exercise can help to relieve that "uptight" feeling and will relax you.  The shortest distance between despair and hope is often a good night's sleep.  Go to bed and get up on time allowing yourself time for appointments without being rushed.  Take a short "time-out" period from any stressful situation that occurs during the day. Close your eyes and take some deep breaths. Starting with the muscles in your face, tense them, hold it for a few seconds, then relax. Repeat this with the muscles in your neck, shoulders, hand, stomach, back and legs.  Take good care of yourself. Eat a balanced diet and get plenty of rest.  Schedule time for having fun. Take a break from your daily routine to relax. HOME CARE INSTRUCTIONS   Call if you feel overwhelmed by your problems and feel you can no longer manage them on your own.  Return immediately if you feel like hurting yourself or someone else. Document Released: 05/17/2001 Document Revised: 02/13/2012 Document Reviewed: 01/07/2008 ExitCare Patient Information 2013 [employer].   

## 2014-07-25 LAB — CBC
HCT: 43 % (ref 36.0–46.0)
Hemoglobin: 14.4 g/dL (ref 12.0–15.0)
MCH: 32.5 pg (ref 26.0–34.0)
MCHC: 33.5 g/dL (ref 30.0–36.0)
MCV: 97.1 fL (ref 78.0–100.0)
PLATELETS: 182 10*3/uL (ref 150–400)
RBC: 4.43 MIL/uL (ref 3.87–5.11)
RDW: 13.4 % (ref 11.5–15.5)
WBC: 6.8 10*3/uL (ref 4.0–10.5)

## 2014-07-25 LAB — LIPID PANEL
CHOL/HDL RATIO: 2.5 ratio
CHOLESTEROL: 156 mg/dL (ref 0–200)
HDL: 62 mg/dL (ref >39)
LDL Cholesterol: 77 mg/dL (ref 0–99)
TRIGLYCERIDES: 85 mg/dL (ref <150)
VLDL: 17 mg/dL (ref 0–40)

## 2014-07-25 LAB — TSH: TSH: 3.043 u[IU]/mL (ref 0.350–4.500)

## 2014-07-28 LAB — IPS PAP TEST WITH HPV

## 2015-03-06 ENCOUNTER — Encounter: Payer: Self-pay | Admitting: Emergency Medicine

## 2015-03-06 ENCOUNTER — Emergency Department (INDEPENDENT_AMBULATORY_CARE_PROVIDER_SITE_OTHER)
Admission: EM | Admit: 2015-03-06 | Discharge: 2015-03-06 | Disposition: A | Discharge status: Home / Self Care | Payer: 59 | Source: Home / Self Care | Attending: Family Medicine | Admitting: Family Medicine

## 2015-03-06 DIAGNOSIS — R69 Illness, unspecified: Principal | ICD-10-CM

## 2015-03-06 DIAGNOSIS — J111 Influenza due to unidentified influenza virus with other respiratory manifestations: Secondary | ICD-10-CM

## 2015-03-06 MED ORDER — AZITHROMYCIN 250 MG PO TABS: ORAL_TABLET | ORAL | Status: DC

## 2015-03-06 MED ORDER — GUAIFENESIN-CODEINE 100-10 MG/5ML PO SOLN: ORAL | Status: DC

## 2015-03-06 NOTE — ED Provider Notes (Signed)
CSN: [id number]     Arrival date & time 03/06/15  1157 History   First MD Initiated Contact with Patient 03/06/15 1311     Chief Complaint  Patient presents with  . Influenza      HPI Comments: Complains of one week history flu-like illness including myalgias, headache, fever/chills, fatigue, and cough.  Also has nasal congestion and sore throat.  Cough is non-productive and somewhat worse at night.  No pleuritic pain but complains of shortness of breath with activity.  She has had mild nausea without vomiting, and loose stools.  The history is provided by the patient and a relative.    Past Medical History  Diagnosis Date  . Abnormal Pap smear of cervix    Past Surgical History  Procedure Laterality Date  . Tonsillectomy    . Breast reduction surgery    . Lymph node dissection    . Colposcopy    . Cervical biopsy  w/ loop electrode excision  2004  . Wisdom tooth extraction  1995  . Adenoidectomy     Family History  Problem Relation Age of Onset  . Hypertension Mother   . Hypertension Father   . Cancer Father     prostate  . Cancer Maternal Grandmother     rectal  . Diabetes Maternal Grandfather   . Breast cancer Paternal Grandmother 78  . Heart attack Paternal Grandfather    History  Substance Use Topics  . Smoking status: Never Smoker   . Smokeless tobacco: Never Used  . Alcohol Use: 1.0 - 1.5 oz/week    2-3 drink(s) per week   OB History    Gravida Para Term Preterm AB TAB SAB Ectopic Multiple Living       Review of Systems + sore throat + hoarse + cough No pleuritic pain No wheezing + nasal congestion + post-nasal drainage No sinus pain/pressure No itchy/red eyes No earache No hemoptysis + SOB with activity + fever, + chills/sweats + nausea No vomiting No abdominal pain + diarrhea No urinary symptoms No skin rash + fatigue + myalgias + headache Used OTC meds without relief   Allergies  Penicillins and Sulfur  Home  Medications   Prior to Admission medications   Medication Sig Start Date End Date Taking? Authorizing Provider  azithromycin (ZITHROMAX Z-PAK) 250 MG tablet Take 2 tabs today; then begin one tab once daily for 4 more days. (Rx void after 03/13/15) 03/06/15   Lattie Haw, MD  BIOTIN PO Take by mouth daily.    Historical Provider, MD  Calcium Carbonate-Vitamin D (CALCIUM + D PO) Take by mouth daily.    Historical Provider, MD  Cyanocobalamin (B-12 PO) Take by mouth daily. liquid    Historical Provider, MD  guaiFENesin-codeine 100-10 MG/5ML syrup Take 10mL by mouth at bedtime as needed for cough 03/06/15   Lattie Haw, MD  Multiple Vitamins-Minerals (MULTIVITAMIN PO) Take by mouth daily.    Historical Provider, MD   BP 113/76 mmHg  Pulse 72  Temp(Src) 98.8 F (37.1 C) (Oral)  Ht  (1.727 m)  Wt 169 lb (76.658 kg)  BMI 25.70 kg/m2  SpO2 100%  LMP 02/12/2015 Physical Exam Nursing notes and Vital Signs reviewed. Appearance:  Patient appears stated age, and in no acute distress Eyes:  Pupils are equal, round, and reactive to light and accomodation.  Extraocular movement is intact.  Conjunctivae are not inflamed  Ears:  Canals normal.  Tympanic membranes normal.  Nose:  Mildly congested turbinates.  No sinus tenderness.   Pharynx:  Normal Neck:  Supple.  Tender enlarged posterior nodes are palpated bilaterally  Lungs:  Clear to auscultation.  Breath sounds are equal.  Heart:  Regular rate and rhythm without murmurs, rubs, or gallops.  Abdomen:  Nontender without masses or hepatosplenomegaly.  Bowel sounds are present.  No CVA or flank tenderness.  Extremities:  No edema.  No calf tenderness Skin:  No rash present.    ED Course  Procedures  none  MDM   1. Influenza-like illness    There is no evidence of bacterial infection today.  Rx for Robitussin AC for night time cough.  Take plain guaifenesin (1200mg  extended release tabs such as Mucinex) twice daily, with plenty of water,  for cough and congestion.  May add Pseudoephedrine (30mg , one or two every 4 to 6 hours) for sinus congestion.  Get adequate rest.   May use Afrin nasal spray (or generic oxymetazoline) twice daily for about 5 days.  Also recommend using saline nasal spray several times daily and saline nasal irrigation (AYR is a common brand).   Try warm salt water gargles for sore throat.  Stop all antihistamines for now, and other non-prescription cough/cold preparations. May take Ibuprofen 200mg , 4 tabs every 8 hours with food for headache, fever, etc. Begin Azithromycin if not improving about five days or if persistent fever develops (Given a prescription to hold, with an expiration date)  Follow-up with family doctor if not improving about one week.    Lattie HawStephen A Kohen Reither, MD 03/06/15 [id number]

## 2015-03-06 NOTE — Discharge Instructions (Signed)
Take plain guaifenesin (1200mg  extended release tabs such as Mucinex) twice daily, with plenty of water, for cough and congestion.  May add Pseudoephedrine (30mg , one or two every 4 to 6 hours) for sinus congestion.  Get adequate rest.   May use Afrin nasal spray (or generic oxymetazoline) twice daily for about 5 days.  Also recommend using saline nasal spray several times daily and saline nasal irrigation (AYR is a common brand).   Try warm salt water gargles for sore throat.  Stop all antihistamines for now, and other non-prescription cough/cold preparations. May take Ibuprofen 200mg , 4 tabs every 8 hours with food for headache, fever, etc. Begin Azithromycin if not improving about five days or if persistent fever develops   Follow-up with family doctor if not improving about one week.

## 2015-03-06 NOTE — ED Notes (Signed)
Fever, cough, body aches, headache, fatigue x 6 days. Diarrhea earlier in the week but now resolved.

## 2015-03-23 ENCOUNTER — Ambulatory Visit (INDEPENDENT_AMBULATORY_CARE_PROVIDER_SITE_OTHER): Payer: 59 | Admitting: Family Medicine

## 2015-03-23 ENCOUNTER — Encounter: Payer: Self-pay | Admitting: Family Medicine

## 2015-03-23 VITALS — BP 121/84 | HR 77 | Ht 68.0 in | Wt 167.0 lb

## 2015-03-23 DIAGNOSIS — E039 Hypothyroidism, unspecified: Secondary | ICD-10-CM

## 2015-03-23 DIAGNOSIS — Z0000 Encounter for general adult medical examination without abnormal findings: Secondary | ICD-10-CM | POA: Diagnosis not present

## 2015-03-23 NOTE — Progress Notes (Signed)
CC: Tara Rosales is a 34 y.o. female is here for Establish Care   Subjective: HPI:  Colonoscopy: No current indication Papsmear: Had a normal last year but gets annual Pap smears for history of LEEP, she preferred to this with her GYN Mammogram: (No current indication   Influenza Vaccine: Out of season Pneumovax: No current indication Td/Tdap: UTD 2007 Zoster: (Start 36 yo)  Pleasant 34 year old here to establish care requesting complete physical exam. Her only complaint is fatigue and lack of energy since she had what she thinks was the flu back around Easter. Symptoms have been mild to moderate in severity and although cough, with adenopathy in the back of the neck, sore throat, fever has all resolved she is left with a degree of fatigue. Nothing seems to make the 15th at worst. She describes herself as a very active individual when she is not feeling like she does now.  Review of Systems - General ROS: negative for - chills, fever, night sweats, weight gain or weight loss Ophthalmic ROS: negative for - decreased vision Psychological ROS: negative for - anxiety or depression ENT ROS: negative for - hearing change, nasal congestion, tinnitus or allergies Hematological and Lymphatic ROS: negative for - bleeding problems, bruising or swollen lymph nodes Breast ROS: negative Respiratory ROS: no cough, shortness of breath, or wheezing Cardiovascular ROS: no chest pain or dyspnea on exertion Gastrointestinal ROS: no abdominal pain, change in bowel habits, or black or bloody stools Genito-Urinary ROS: negative for - genital discharge, genital ulcers, incontinence or abnormal bleeding from genitals Musculoskeletal ROS: negative for - joint pain or muscle pain Neurological ROS: negative for - headaches or memory loss Dermatological ROS: negative for lumps, mole changes, rash and skin lesion changes  Past Medical History  Diagnosis Date  . Abnormal Pap smear of cervix   .  Hypothyroidism     Past Surgical History  Procedure Laterality Date  . Tonsillectomy    . Breast reduction surgery    . Lymph node dissection    . Colposcopy    . Cervical biopsy  w/ loop electrode excision  2004  . Wisdom tooth extraction  1995  . Adenoidectomy     Family History  Problem Relation Age of Onset  . Hypertension Mother   . Hypertension Father   . Cancer Father     prostate  . Cancer Maternal Grandmother     rectal  . Diabetes Maternal Grandfather   . Breast cancer Paternal Grandmother [phone]  . Heart attack Paternal Grandfather     History   Social History  . Marital Status: Single    Spouse Name: N/A  . Number of Children: N/A  . Years of Education: N/A   Occupational History  . Not on file.   Social History Main Topics  . Smoking status: Never Smoker   . Smokeless tobacco: Never Used  . Alcohol Use: 1.2 - 1.8 oz/week    2-3 Standard drinks or equivalent per week  . Drug Use: No  . Sexual Activity:    Partners: Male    Birth Control/ Protection: Abstinence   Other Topics Concern  . Not on file   Social History Narrative     Objective: BP 121/84 mmHg  Pulse 77  Ht 5\' 8"  (1.727 m)  Wt 167 lb (75.751 kg)  BMI 25.40 kg/m2  LMP 03/11/2015  General: No Acute Distress HEENT: Atraumatic, normocephalic, conjunctivae normal without scleral icterus.  No nasal discharge, hearing grossly intact, TMs with good landmarks  bilaterally with no middle ear abnormalities, posterior pharynx clear without oral lesions. Neck: Supple, trachea midline, no cervical nor supraclavicular adenopathy. Pulmonary: Clear to auscultation bilaterally without wheezing, rhonchi, nor rales. Cardiac: Regular rate and rhythm.  No murmurs, rubs, nor gallops. No peripheral edema.  2+ peripheral pulses bilaterally. Abdomen: Bowel sounds normal.  No masses.  Non-tender without rebound.  Negative Murphy's sign. GU: Declined MSK: Grossly intact, no signs of weakness.  Full strength  throughout upper and lower extremities.  Full ROM in upper and lower extremities.  No midline spinal tenderness. Neuro: Gait unremarkable, CN II-XII grossly intact.  C5-C6 Reflex 2/4 Bilaterally, L4 Reflex 2/4 Bilaterally.  Cerebellar function intact. Skin: No rashes. Psych: Alert and oriented to person/place/time.  Thought process normal. No anxiety/depression.   Assessment & Plan: Tara Rosales was seen today for establish care.  Diagnoses and all orders for this visit:  Annual physical exam Orders: -     TSH -     T3, free -     T4, free -     CBC -     COMPLETE METABOLIC PANEL WITH GFR  Hypothyroidism, unspecified hypothyroidism type Orders: -     TSH -     T3, free -     T4, free -     CBC -     COMPLETE METABOLIC PANEL WITH GFR   Healthy lifestyle interventions including but not limited to regular exercise, a healthy low fat diet, moderation of salt intake, the dangers of tobacco/alcohol/recreational drug use, nutrition supplementation, and accident avoidance were discussed with the patient and a handout was provided for future reference.  I wonder if she had mono instead of the flu which would make sense what she still having a little bit of fatigue. She tells that she also had left upper quadrant pain when she was experiencing symptoms she originally attributed to the flu.  History of hypothyroidism not currently taking any medication for this. Rechecking thyroid panel.  Ultimate follow-up will be based on the above results     Return if symptoms worsen or fail to improve.

## 2015-03-24 ENCOUNTER — Telehealth: Payer: Self-pay | Admitting: Family Medicine

## 2015-03-24 ENCOUNTER — Other Ambulatory Visit: Payer: Self-pay | Admitting: *Deleted

## 2015-03-24 DIAGNOSIS — E039 Hypothyroidism, unspecified: Secondary | ICD-10-CM

## 2015-03-24 DIAGNOSIS — E038 Other specified hypothyroidism: Secondary | ICD-10-CM | POA: Insufficient documentation

## 2015-03-24 LAB — CBC
HCT: 43.8 % (ref 36.0–46.0)
HEMOGLOBIN: 14.7 g/dL (ref 12.0–15.0)
MCH: 32.4 pg (ref 26.0–34.0)
MCHC: 33.6 g/dL (ref 30.0–36.0)
MCV: 96.5 fL (ref 78.0–100.0)
MPV: 11.1 fL (ref 8.6–12.4)
Platelets: 219 10*3/uL (ref 150–400)
RBC: 4.54 MIL/uL (ref 3.87–5.11)
RDW: 12.7 % (ref 11.5–15.5)
WBC: 8.8 10*3/uL (ref 4.0–10.5)

## 2015-03-24 LAB — COMPLETE METABOLIC PANEL WITH GFR
ALBUMIN: 4.3 g/dL (ref 3.5–5.2)
ALK PHOS: 50 U/L (ref 39–117)
ALT: 9 U/L (ref 0–35)
AST: 16 U/L (ref 0–37)
BUN: 15 mg/dL (ref 6–23)
CALCIUM: 9.8 mg/dL (ref 8.4–10.5)
CHLORIDE: 101 meq/L (ref 96–112)
CO2: 30 meq/L (ref 19–32)
Creat: 0.72 mg/dL (ref 0.50–1.10)
GFR, Est African American: >89 mL/min
GLUCOSE: 82 mg/dL (ref 70–99)
POTASSIUM: 4.5 meq/L (ref 3.5–5.3)
SODIUM: 137 meq/L (ref 135–145)
TOTAL PROTEIN: 6.9 g/dL (ref 6.0–8.3)
Total Bilirubin: 0.5 mg/dL (ref 0.2–1.2)

## 2015-03-24 LAB — T3, FREE: T3 FREE: 2.2 pg/mL — AB (ref 2.3–4.2)

## 2015-03-24 LAB — TSH: TSH: 2.828 u[IU]/mL (ref 0.350–4.500)

## 2015-03-24 LAB — T4, FREE: FREE T4: 1.03 ng/dL (ref 0.80–1.80)

## 2015-03-24 MED ORDER — LEVOTHYROXINE SODIUM 25 MCG PO TABS: 25.0000 ug | ORAL_TABLET | Freq: Every day | ORAL | Status: DC

## 2015-03-24 NOTE — Telephone Encounter (Signed)
Patient advised of results and recommendations.  

## 2015-03-24 NOTE — Telephone Encounter (Signed)
Sue Lushndrea, Will you please let patient know that her thyroid test confirms a mild case of hypothyroidism and I'd recommend she start taking a low dose of levothyroxine that I've sent to her [facility] in [place].  I'd recommend she return in 3 months to recheck her levels.  Blood cell counts, kidney function, liver function, and blood sugar were all normal.

## 2015-03-26 ENCOUNTER — Encounter: Payer: Self-pay | Admitting: Family Medicine

## 2015-03-26 DIAGNOSIS — E559 Vitamin D deficiency, unspecified: Secondary | ICD-10-CM | POA: Insufficient documentation

## 2015-04-07 ENCOUNTER — Encounter: Payer: Self-pay | Admitting: Family Medicine

## 2015-06-03 ENCOUNTER — Encounter: Payer: Self-pay | Admitting: Emergency Medicine

## 2015-06-03 ENCOUNTER — Emergency Department (INDEPENDENT_AMBULATORY_CARE_PROVIDER_SITE_OTHER)
Admission: EM | Admit: 2015-06-03 | Discharge: 2015-06-03 | Disposition: A | Discharge status: Home / Self Care | Payer: 59 | Source: Home / Self Care | Attending: Family Medicine | Admitting: Family Medicine

## 2015-06-03 DIAGNOSIS — J069 Acute upper respiratory infection, unspecified: Secondary | ICD-10-CM | POA: Diagnosis not present

## 2015-06-03 DIAGNOSIS — J029 Acute pharyngitis, unspecified: Secondary | ICD-10-CM

## 2015-06-03 DIAGNOSIS — B9789 Other viral agents as the cause of diseases classified elsewhere: Secondary | ICD-10-CM

## 2015-06-03 DIAGNOSIS — J302 Other seasonal allergic rhinitis: Secondary | ICD-10-CM | POA: Diagnosis not present

## 2015-06-03 LAB — POCT RAPID STREP A (OFFICE): RAPID STREP A SCREEN: NEGATIVE

## 2015-06-03 MED ORDER — AZITHROMYCIN 250 MG PO TABS: ORAL_TABLET | ORAL | Status: DC

## 2015-06-03 MED ORDER — PREDNISONE 20 MG PO TABS: 20.0000 mg | ORAL_TABLET | Freq: Two times a day (BID) | ORAL | Status: DC

## 2015-06-03 MED ORDER — BENZONATATE 200 MG PO CAPS: 200.0000 mg | ORAL_CAPSULE | Freq: Every day | ORAL | Status: DC

## 2015-06-03 NOTE — ED Provider Notes (Signed)
CSN: [id number]     Arrival date & time 06/03/15  [id number] History   First MD Initiated Contact with Patient 06/03/15 1050     Chief Complaint  Patient presents with  . Nasal Congestion      HPI Comments: Patient complains of four day history of typical cold-like symptoms including mild sore throat, sinus congestion, headache, fatigue, and cough.  Yesterday she had a right earache, decreased hearing, and low grade fever 99.1. She has a history of chronic seasonal rhinitis.  The history is provided by the patient.    Past Medical History  Diagnosis Date  . Abnormal Pap smear of cervix   . Hypothyroidism    Past Surgical History  Procedure Laterality Date  . Tonsillectomy    . Breast reduction surgery    . Lymph node dissection    . Colposcopy    . Cervical biopsy  w/ loop electrode excision  2004  . Wisdom tooth extraction  1995  . Adenoidectomy     Family History  Problem Relation Age of Onset  . Hypertension Mother   . Hypertension Father   . Cancer Father     prostate  . Cancer Maternal Grandmother     rectal  . Diabetes Maternal Grandfather   . Breast cancer Paternal Grandmother 465  . Heart attack Paternal Grandfather    History  Substance Use Topics  . Smoking status: Never Smoker   . Smokeless tobacco: Never Used  . Alcohol Use: 1.2 - 1.8 oz/week    2-3 Standard drinks or equivalent per week   OB History    Gravida Para Term Preterm AB TAB SAB Ectopic Multiple Living   0 0 0 0 0 0 0 0 0 0      Review of Systems + sore throat + hoarse + cough + sneezing No pleuritic pain No wheezing + nasal congestion + post-nasal drainage No sinus pain/pressure No itchy/red eyes ? earache No hemoptysis No SOB + fever, + chills + nausea No vomiting No abdominal pain + diarrhea, resolved No urinary symptoms No skin rash + fatigue No myalgias + headache Used OTC meds without relief  Allergies  Penicillins and Sulfur  Home Medications   Prior to Admission  medications   Medication Sig Start Date End Date Taking? Authorizing Provider  azithromycin (ZITHROMAX Z-PAK) 250 MG tablet Take 2 tabs today; then begin one tab once daily for 4 more days. 06/03/15   Lattie HawStephen A Rochele Lueck, MD  benzonatate (TESSALON) 200 MG capsule Take 1 capsule (200 mg total) by mouth at bedtime. Take as needed for cough 06/03/15   Lattie HawStephen A Tonio Seider, MD  BIOTIN PO Take by mouth daily.    Historical Provider, MD  Calcium Carbonate-Vitamin D (CALCIUM + D PO) Take by mouth daily.    Historical Provider, MD  Cyanocobalamin (B-12 PO) Take by mouth daily. liquid    Historical Provider, MD  levothyroxine (LEVOTHROID) 25 MCG tablet Take 1 tablet (25 mcg total) by mouth daily before breakfast. 03/24/15   Laren BoomSean Hommel, DO  Multiple Vitamins-Minerals (MULTIVITAMIN PO) Take by mouth daily.    Historical Provider, MD  predniSONE (DELTASONE) 20 MG tablet Take 1 tablet (20 mg total) by mouth 2 (two) times daily. Take with food. 06/03/15   Lattie HawStephen A Aricela Bertagnolli, MD   BP 113/76 mmHg  Pulse 61  Temp(Src) 98.5 F (36.9 C) (Oral)  Resp 16  Ht 5\' 8"  (1.727 m)  Wt 170 lb (77.111 kg)  BMI 25.85 kg/m2  SpO2 100%  Physical Exam Nursing notes and Vital Signs reviewed. Appearance:  Patient appears stated age, and in no acute distress Eyes:  Pupils are equal, round, and reactive to light and accomodation.  Extraocular movement is intact.  Conjunctivae are not inflamed  Ears:  Canals normal.  Tympanic membranes normal.  Nose: Congested turbinates.  No sinus tenderness.   Pharynx:  Minimal erythema Neck:  Supple.  Slightly tender shotty anterior nodes.  Enlarged tender posterior nodes are palpated bilaterally  Lungs:  Clear to auscultation.  Breath sounds are equal.  Chest:  Distinct tenderness to palpation over the mid-sternum.  Heart:  Regular rate and rhythm without murmurs, rubs, or gallops.  Abdomen:  Nontender without masses or hepatosplenomegaly.  Bowel sounds are present.  No CVA or flank tenderness.   Extremities:  No edema.  No calf tenderness Skin:  No rash present.   ED Course  Procedures  None    Labs Reviewed  STREP A DNA PROBE  POCT RAPID STREP A (OFFICE) negative      MDM   1. Acute pharyngitis, unspecified pharyngitis type   2. Viral URI with cough   3. Seasonal allergic rhinitis    Begin empiric Z-pack and prednisone burst.  Prescription written for Ben [id number], with plenty of water, for cough and congestion.  May add Pseudoephedrine ( , one or two every 4 to 6 hours) for sinus congestion.  Get adequate rest.   May use Afrin nasal spray (or generic oxymetazoline) twice daily for about 5 days.  Also recommend using saline nasal spray several times daily and saline nasal irrigation (AYR is a common brand).   Try warm salt water gargles for sore throat.  Stop all antihistamines for now, and other non-prescription cough/cold preparations.  Follow-up with family doctor if not improving about10 days.    Lattie Haw, MD 06/05/15 [phone]

## 2015-06-03 NOTE — Discharge Instructions (Signed)
Take plain guaifenesin (1200mg  extended release tabs such as Mucinex) twice daily, with plenty of water, for cough and congestion.  May add Pseudoephedrine (30mg , one or two every 4 to 6 hours) for sinus congestion.  Get adequate rest.   May use Afrin nasal spray (or generic oxymetazoline) twice daily for about 5 days.  Also recommend using saline nasal spray several times daily and saline nasal irrigation (AYR is a common brand).   Try warm salt water gargles for sore throat.  Stop all antihistamines for now, and other non-prescription cough/cold preparations.  Follow-up with family doctor if not improving about10 days.    Salt Water Gargle This solution will help make your mouth and throat feel better. HOME CARE INSTRUCTIONS   Mix 1 teaspoon of salt in 8 ounces of warm water.  Gargle with this solution as much or often as you need or as directed. Swish and gargle gently if you have any sores or wounds in your mouth.  Do not swallow this mixture. Document Released: 08/25/2004 Document Revised: 02/13/2012 Document Reviewed: 01/16/2009 [facility] Patient Information 2015 [place], [place]. This information is not intended to replace advice given to you by your health care provider. Make sure you discuss any questions you have with your health care provider.

## 2015-06-03 NOTE — ED Notes (Signed)
Reports 4 days of increasing congestion, cough, hoarseness and aches; some dizziness and nausea.

## 2015-06-04 LAB — STREP A DNA PROBE: GASP: NEGATIVE

## 2015-06-15 ENCOUNTER — Encounter: Payer: Self-pay | Admitting: Family Medicine

## 2015-06-15 ENCOUNTER — Ambulatory Visit (INDEPENDENT_AMBULATORY_CARE_PROVIDER_SITE_OTHER): Payer: 59 | Admitting: Family Medicine

## 2015-06-15 VITALS — BP 115/76 | HR 71 | Wt 179.0 lb

## 2015-06-15 DIAGNOSIS — E559 Vitamin D deficiency, unspecified: Secondary | ICD-10-CM | POA: Diagnosis not present

## 2015-06-15 DIAGNOSIS — R5383 Other fatigue: Secondary | ICD-10-CM

## 2015-06-15 DIAGNOSIS — E038 Other specified hypothyroidism: Secondary | ICD-10-CM

## 2015-06-15 DIAGNOSIS — G4719 Other hypersomnia: Secondary | ICD-10-CM | POA: Diagnosis not present

## 2015-06-15 DIAGNOSIS — E039 Hypothyroidism, unspecified: Secondary | ICD-10-CM

## 2015-06-15 NOTE — Progress Notes (Signed)
CC: Tara HoesJessica Rosales is a 34 y.o. female is here for f/u thyroid   Subjective: HPI:  Follow-up of hypothyroidism: Since starting on levothyroxine she has not noticed any benefit from fatigue standpoint. She also denies any new side effects.  She's had unintentional weight gain that's been going on for at least half a year now. She denies any weight loss, abdominal pain, constipation, nail changes or skin changes.  Follow vitamin D deficiency: On review of her outside records back in the spring she's had a remote history of vitamin D deficiency. She is currently not taking any supplement for vitamin D. She endorses fatigue to a moderate degree most days of the week.  She tells me her daytime sleepiness and nonrestorative sleep are persistent and have not improved or worsened since I saw her last. No interventions other than starting on levothyroxine but no real benefit. Symptoms are present on a daily basis to a moderate degree. Nothing seems to make them better or worse. She also endorses a headache most days upon waking.  Review Of Systems Outlined In HPI  Past Medical History  Diagnosis Date  . Abnormal Pap smear of cervix   . Hypothyroidism     Past Surgical History  Procedure Laterality Date  . Tonsillectomy    . Breast reduction surgery    . Lymph node dissection    . Colposcopy    . Cervical biopsy  w/ loop electrode excision  2004  . Wisdom tooth extraction  1995  . Adenoidectomy     Family History  Problem Relation Age of Onset  . Hypertension Mother   . Hypertension Father   . Cancer Father     prostate  . Cancer Maternal Grandmother     rectal  . Diabetes Maternal Grandfather   . Breast cancer Paternal Grandmother [phone]  . Heart attack Paternal Grandfather     History   Social History  . Marital Status: Single    Spouse Name: N/A  . Number of Children: N/A  . Years of Education: N/A   Occupational History  . Not on file.   Social History Main Topics  .  Smoking status: Never Smoker   . Smokeless tobacco: Never Used  . Alcohol Use: 1.2 - 1.8 oz/week    2-3 Standard drinks or equivalent per week  . Drug Use: No  . Sexual Activity:    Partners: Male    Birth Control/ Protection: Abstinence   Other Topics Concern  . Not on file   Social History Narrative     Objective: BP 115/76 mmHg  Pulse 71  Wt 179 lb (81.194 kg)  General: Alert and Oriented, No Acute Distress HEENT: Pupils equal, round, reactive to light. Conjunctivae clear.  External ears unremarkable, canals clear with intact TMs moist. His membranes pharynx unremarkable Lungs: Clear to auscultation bilaterally, no wheezing/ronchi/rales.  Comfortable work of breathing. Good air movement. Cardiac: Regular rate and rhythm. Normal S1/S2.  No murmurs, rubs, nor gallops.   Extremities: No peripheral edema.  Strong peripheral pulses.  Mental Status: No depression, anxiety, nor agitation. Skin: Warm and dry.  Assessment & Plan: Tara Rosales was seen today for f/u thyroid.  Diagnoses and all orders for this visit:  Subclinical hypothyroidism Orders: -     TSH -     T3, free  Vitamin D deficiency Orders: -     Vit D  25 hydroxy (rtn osteoporosis monitoring)  Excessive daytime sleepiness Orders: -     TSH -  T3, free -     Home sleep test  Other fatigue Orders: -     Home sleep test   Hypothyroidism: Rechecking TSH and T3, continue levothyroxine pending results Vitamin D deficiency: Rule out continued deficiency as a source of her fatigue Excessive daytime sleepiness: Given persistence of symptoms, family history of OSA, and headaches in the morning home sleep test has been ordered to rule out OSA  25 minutes spent face-to-face during visit today of which at least 50% was counseling or coordinating care regarding: 1. Subclinical hypothyroidism   2. Vitamin D deficiency   3. Excessive daytime sleepiness   4. Other fatigue      Return if symptoms worsen or fail  to improve.

## 2015-06-16 ENCOUNTER — Telehealth: Payer: Self-pay | Admitting: Family Medicine

## 2015-06-16 ENCOUNTER — Other Ambulatory Visit: Payer: Self-pay | Admitting: *Deleted

## 2015-06-16 LAB — T3, FREE: T3, Free: 2.5 pg/mL (ref 2.3–4.2)

## 2015-06-16 LAB — VITAMIN D 25 HYDROXY (VIT D DEFICIENCY, FRACTURES): Vit D, 25-Hydroxy: 25 ng/mL — ABNORMAL LOW (ref 30–100)

## 2015-06-16 LAB — TSH: TSH: 2.364 u[IU]/mL (ref 0.350–4.500)

## 2015-06-16 MED ORDER — LEVOTHYROXINE SODIUM 25 MCG PO TABS: 25.0000 ug | ORAL_TABLET | Freq: Every day | ORAL | Status: DC

## 2015-06-16 MED ORDER — VITAMIN D (ERGOCALCIFEROL) 1.25 MG (50000 UNIT) PO CAPS: 50000.0000 [IU] | ORAL_CAPSULE | ORAL | Status: DC

## 2015-06-16 NOTE — Telephone Encounter (Signed)
Pt notified of results & rx.  I also refilled her Levothyroxine.

## 2015-06-16 NOTE — Telephone Encounter (Signed)
Andrea/Coverage, Will you please let patient know that her Vitamin D level was in the deficient range and I'd recommend starting a weekly vitamin D supplement for the next three months that I've sent to her [facility].  I'd still recommend going through with the home sleep test so please let me know if not contacted by the end of this week.

## 2015-06-22 ENCOUNTER — Ambulatory Visit: Payer: 59 | Admitting: Family Medicine

## 2015-07-07 ENCOUNTER — Ambulatory Visit (HOSPITAL_BASED_OUTPATIENT_CLINIC_OR_DEPARTMENT_OTHER): Payer: 59 | Attending: Family Medicine | Admitting: Radiology

## 2015-07-07 VITALS — Ht 68.0 in | Wt 175.0 lb

## 2015-07-07 DIAGNOSIS — G4710 Hypersomnia, unspecified: Secondary | ICD-10-CM | POA: Diagnosis not present

## 2015-07-07 DIAGNOSIS — R5383 Other fatigue: Secondary | ICD-10-CM | POA: Insufficient documentation

## 2015-07-07 DIAGNOSIS — G4733 Obstructive sleep apnea (adult) (pediatric): Secondary | ICD-10-CM | POA: Insufficient documentation

## 2015-07-07 DIAGNOSIS — G4719 Other hypersomnia: Secondary | ICD-10-CM

## 2015-07-07 DIAGNOSIS — R0683 Snoring: Secondary | ICD-10-CM | POA: Diagnosis not present

## 2015-07-24 ENCOUNTER — Telehealth: Payer: Self-pay | Admitting: *Deleted

## 2015-07-27 ENCOUNTER — Ambulatory Visit: Payer: Managed Care, Other (non HMO) | Admitting: Certified Nurse Midwife

## 2015-07-28 NOTE — Telephone Encounter (Signed)
closed

## 2015-07-31 ENCOUNTER — Encounter: Payer: Self-pay | Admitting: Certified Nurse Midwife

## 2015-07-31 ENCOUNTER — Ambulatory Visit (INDEPENDENT_AMBULATORY_CARE_PROVIDER_SITE_OTHER): Payer: Managed Care, Other (non HMO) | Admitting: Certified Nurse Midwife

## 2015-07-31 VITALS — BP 102/60 | HR 80 | Resp 16 | Ht 67.0 in | Wt 180.0 lb

## 2015-07-31 DIAGNOSIS — Z01419 Encounter for gynecological examination (general) (routine) without abnormal findings: Secondary | ICD-10-CM | POA: Diagnosis not present

## 2015-07-31 DIAGNOSIS — Z124 Encounter for screening for malignant neoplasm of cervix: Secondary | ICD-10-CM | POA: Diagnosis not present

## 2015-07-31 NOTE — Patient Instructions (Signed)

## 2015-07-31 NOTE — Progress Notes (Signed)
34 y.o. G0P0000 Single  Caucasian Fe here for annual exam. Periods normal, no issues. Thyroid issues continue to be unstable, but working with PCP management. Has aex and labs with PCP also. Feeling better now. Recent sleep study to rule out apnea, no results yet. No other health concerns today.   Patient's last menstrual period was 07/20/2015.          Sexually active: No.  The current method of family planning is condoms Always.    Exercising: Yes.    Yoga, run, light weights  Smoker:  no  Health Maintenance: Pap:  07/24/14 Neg. HR HPV: Neg Hx of Leep 2004 LSIL  MMG:  Never TDaP:  2007 Labs: PCP   reports that she has never smoked. She has never used smokeless tobacco. She reports that she drinks about 1.2 - 1.8 oz of alcohol per week. She reports that she does not use illicit drugs.  Past Medical History  Diagnosis Date  . Abnormal Pap smear of cervix   . Hypothyroidism     Past Surgical History  Procedure Laterality Date  . Tonsillectomy    . Breast reduction surgery    . Lymph node dissection    . Colposcopy    . Cervical biopsy  w/ loop electrode excision  2004  . Wisdom tooth extraction  1995  . Adenoidectomy      Current Outpatient Prescriptions  Medication Sig Dispense Refill  . Calcium Carbonate-Vit D-Min (CALCIUM 1200 PO) Take by mouth daily.    . Cyanocobalamin (VITAMIN B 12 PO) Take by mouth daily.    Marland Kitchen levothyroxine (LEVOTHROID) 25 MCG tablet Take 1 tablet (25 mcg total) by mouth daily before breakfast. 90 tablet 1  . Multiple Vitamin (MULTIVITAMIN) tablet Take 1 tablet by mouth daily.    . Vitamin D, Ergocalciferol, (DRISDOL) 50000 UNITS CAPS capsule Take 1 capsule (50,000 Units total) by mouth every 7 (seven) days. Recheck Vitamin D in 3 Months 12 capsule 0   No current facility-administered medications for this visit.    Family History  Problem Relation Age of Onset  . Hypertension Mother   . Hypertension Father   . Cancer Father     prostate  .  Cancer Maternal Grandmother     rectal  . Diabetes Maternal Grandfather   . Breast cancer Paternal Grandmother 23  . Heart attack Paternal Grandfather     ROS:  Pertinent items are noted in HPI.  Otherwise, a comprehensive ROS was negative.  Exam:   BP 102/60 mmHg  Pulse 80  Resp 16  Ht 5\' 7"  (1.702 m)  Wt 180 lb (81.647 kg)  BMI 28.19 kg/m2  LMP 07/20/2015 Height: 5\' 7"  (170.2 cm) Ht Readings from Last 3 Encounters:  07/31/15 5\' 7"  (1.702 m)  07/07/15 5\' 8"  (1.727 m)  06/03/15 5\' 8"  (1.727 m)    General appearance: alert, cooperative and appears stated age Head: Normocephalic, without obvious abnormality, atraumatic Neck: no adenopathy, supple, symmetrical, trachea midline and thyroid normal to inspection and palpation Lungs: clear to auscultation bilaterally Breasts: normal appearance, no masses or tenderness, No nipple retraction or dimpling, No nipple discharge or bleeding, No axillary or supraclavicular adenopathy Heart: regular rate and rhythm Abdomen: soft, non-tender; no masses,  no organomegaly Extremities: extremities normal, atraumatic, no cyanosis or edema Skin: Skin color, texture, turgor normal. No rashes or lesions Lymph nodes: Cervical, supraclavicular, and axillary nodes normal. No abnormal inguinal nodes palpated Neurologic: Grossly normal   Pelvic: External genitalia:  no lesions  Urethra:  normal appearing urethra with no masses, tenderness or lesions              Bartholin's and Skene's: normal                 Vagina: normal appearing vagina with normal color and discharge, no lesions              Cervix: normal              Pap taken: Yes.   Bimanual Exam:  Uterus:  normal size, contour, position, consistency, mobility, non-tender              Adnexa: normal adnexa and no mass, fullness, tenderness               Rectovaginal: Confirms               Anus:  normal sphincter tone, no lesions  Chaperone present: yes   A:  Well Woman  with normal exam  Contraception none needed  Hypothyroid unstable at present with PCP management  P:   Reviewed health and wellness pertinent to exam  Will advise if changes  Continue with MD for follow up as indicated  Pap smear as above with HPVHR   counseled on breast self exam, mammography screening, adequate intake of calcium and vitamin D, diet and exercise  return annually or prn  An After Visit Summary was printed and given to the patient.

## 2015-07-31 NOTE — Progress Notes (Signed)
Reviewed personally.  M. Suzanne Sacoya Mcgourty, MD.  

## 2015-08-01 DIAGNOSIS — G4719 Other hypersomnia: Secondary | ICD-10-CM | POA: Diagnosis not present

## 2015-08-01 NOTE — Progress Notes (Signed)
   Patient Name: Tara Rosales, Tara Rosales Date: 07/07/2015 Gender: Female D.O.B: 1981-01-02 Age (years): 34 Referring Provider: Laren Boom Height (inches): 68 Interpreting Physician: Jetty Duhamel MD, ABSM Weight (lbs): 167 RPSGT: [place] BMI: 25 MRN: [id number] Neck Size: 14.00 CLINICAL INFORMATION Sleep Study Type: Unattended Home Sleep Test     Indication for sleep study: 780.54 Hypersomnia, Excessive Daytime Sleepiness, Fatigue, Hypersomnia, OSA, Snoring (786.09)     Epworth Sleepiness Score: 6  SLEEP STUDY TECHNIQUE A multi-channel overnight portable sleep study was performed. The channels recorded were: nasal airflow, thoracic respiratory movement, and oxygen saturation with a pulse oximetry. Snoring was also monitored.  MEDICATIONS Patient self administered medications include: N/A.  SLEEP ARCHITECTURE Patient was studied for 418.8 minutes. The sleep efficiency was 100.0 % and the patient was supine for 99.4%. The arousal index was 0.0 per hour.  RESPIRATORY PARAMETERS The overall AHI was 0.9 per hour, with a central apnea index of 0.0 per hour.  The oxygen nadir was 93% during sleep.  CARDIAC DATA Mean heart rate during sleep was 68.7 bpm.  IMPRESSIONS No significant obstructive sleep apnea occurred during this study (AHI = 0.9/h). No significant central sleep apnea occurred during this study (CAI = 0.0/h). The patient had minimal or no oxygen desaturation during the study (Min O2 = 93%) Patient snored 17.0% during the sleep.  DIAGNOSIS Normal study  RECOMMENDATIONS Avoid alcohol, sedatives and other CNS depressants that may worsen sleep apnea and disrupt normal sleep architecture. Sleep hygiene should be reviewed to assess factors that may improve sleep quality. Weight management and regular exercise should be initiated or continued.    Waymon Budge Diplomate, American Board of Sleep Medicine  ELECTRONICALLY SIGNED ON:  08/01/2015,  9:01 AM [facility] PH: [phone]   FX: [id number] ACCREDITED BY THE AMERICAN ACADEMY OF SLEEP MEDICINE

## 2015-08-03 ENCOUNTER — Telehealth: Payer: Self-pay | Admitting: Family Medicine

## 2015-08-03 NOTE — Telephone Encounter (Signed)
Sue Lush, Will you please let patient know that I just got the results of her sleep test and it does not show any signs of sleep apnea. This is reassuring.

## 2015-08-03 NOTE — Telephone Encounter (Signed)
Pt.notified

## 2015-08-11 LAB — IPS PAP TEST WITH HPV

## 2015-08-17 ENCOUNTER — Encounter: Payer: Self-pay | Admitting: Family Medicine

## 2015-08-17 ENCOUNTER — Ambulatory Visit (INDEPENDENT_AMBULATORY_CARE_PROVIDER_SITE_OTHER): Payer: 59 | Admitting: Family Medicine

## 2015-08-17 VITALS — BP 114/84 | HR 70 | Temp 98.7°F | Wt 182.0 lb

## 2015-08-17 DIAGNOSIS — A499 Bacterial infection, unspecified: Secondary | ICD-10-CM | POA: Diagnosis not present

## 2015-08-17 DIAGNOSIS — B9689 Other specified bacterial agents as the cause of diseases classified elsewhere: Secondary | ICD-10-CM

## 2015-08-17 DIAGNOSIS — J329 Chronic sinusitis, unspecified: Secondary | ICD-10-CM | POA: Diagnosis not present

## 2015-08-17 MED ORDER — DOXYCYCLINE HYCLATE 100 MG PO TABS: ORAL_TABLET | ORAL | Status: AC

## 2015-08-17 MED ORDER — PREDNISONE 20 MG PO TABS: ORAL_TABLET | ORAL | Status: AC

## 2015-08-17 NOTE — Progress Notes (Signed)
CC: Tara Rosales is a 34 y.o. female is here for URI? and left ear pain   Subjective: HPI:  Bilateral ear fullness left greater than right, nasal congestion, postnasal drip and subjective fevers. Symptoms began a little over week ago. Symptoms have been worsening ever since onset. No benefit from Afrin, Tylenol, nor Mucinex. Symptoms are present all hours of the day but worse first thing in the morning. Denies difficulty breathing or cough. Denies hearing loss, headache or motor or sensory disturbances.   Review Of Systems Outlined In HPI  Past Medical History  Diagnosis Date  . Abnormal Pap smear of cervix   . Hypothyroidism     Past Surgical History  Procedure Laterality Date  . Tonsillectomy    . Breast reduction surgery    . Lymph node dissection    . Colposcopy    . Cervical biopsy  w/ loop electrode excision  2004  . Wisdom tooth extraction  1995  . Adenoidectomy     Family History  Problem Relation Age of Onset  . Hypertension Mother   . Hypertension Father   . Cancer Father     prostate  . Cancer Maternal Grandmother     rectal  . Diabetes Maternal Grandfather   . Breast cancer Paternal Grandmother 45  . Heart attack Paternal Grandfather     Social History   Social History  . Marital Status: Single    Spouse Name: N/A  . Number of Children: N/A  . Years of Education: N/A   Occupational History  . Not on file.   Social History Main Topics  . Smoking status: Never Smoker   . Smokeless tobacco: Never Used  . Alcohol Use: 1.2 - 1.8 oz/week    2-3 Standard drinks or equivalent per week  . Drug Use: No  . Sexual Activity:    Partners: Male    Birth Control/ Protection: Abstinence, Condom   Other Topics Concern  . Not on file   Social History Narrative     Objective: BP 114/84 mmHg  Pulse 70  Temp(Src) 98.7 F (37.1 C) (Oral)  Wt 182 lb (82.555 kg)  LMP 07/20/2015  General: Alert and Oriented, No Acute Distress HEENT: Pupils equal,  round, reactive to light. Conjunctivae clear.  External ears unremarkable, canals clear with intact TMs with appropriate landmarks.  Middle ear appears open without effusion. Pink inferior turbinates.  Moist mucous membranes, pharynx without inflammation nor lesions other than moderate postnasal drip.  Neck supple without palpable lymphadenopathy nor abnormal masses. Lungs: Clear to auscultation bilaterally, no wheezing/ronchi/rales.  Comfortable work of breathing. Good air movement. Extremities: No peripheral edema.  Strong peripheral pulses.  Mental Status: No depression, anxiety, nor agitation. Skin: Warm and dry.  Assessment & Plan: Tara Rosales was seen today for uri? and left ear pain.  Diagnoses and all orders for this visit:  Bacterial sinusitis  Other orders -     doxycycline (VIBRA-TABS) 100 MG tablet; One by mouth twice a day for ten days. -     predniSONE (DELTASONE) 20 MG tablet; Three tabs at once daily for five days.   Start doxycycline for sinus infection, she tells me that it is not uncommon for antibodies to not be effective for her above symptoms therefore also given prescription for prednisone should she need it after 2 days of the antibiotic. Encouraged to start as needed nasal saline washes.  Return if symptoms worsen or fail to improve.

## 2015-09-07 ENCOUNTER — Ambulatory Visit (INDEPENDENT_AMBULATORY_CARE_PROVIDER_SITE_OTHER): Payer: 59 | Admitting: Family Medicine

## 2015-09-07 ENCOUNTER — Encounter: Payer: Self-pay | Admitting: Family Medicine

## 2015-09-07 VITALS — BP 112/72 | HR 72 | Wt 175.0 lb

## 2015-09-07 DIAGNOSIS — E038 Other specified hypothyroidism: Secondary | ICD-10-CM

## 2015-09-07 DIAGNOSIS — Z23 Encounter for immunization: Secondary | ICD-10-CM

## 2015-09-07 DIAGNOSIS — E039 Hypothyroidism, unspecified: Secondary | ICD-10-CM

## 2015-09-07 DIAGNOSIS — E559 Vitamin D deficiency, unspecified: Secondary | ICD-10-CM

## 2015-09-07 LAB — T4, FREE: FREE T4: 1.34 ng/dL (ref 0.80–1.80)

## 2015-09-07 LAB — TSH: TSH: 2.622 u[IU]/mL (ref 0.350–4.500)

## 2015-09-07 NOTE — Progress Notes (Signed)
CC: Tara Rosales is a 34 y.o. female is here for Follow-up   Subjective: HPI:  Vitamin D deficiency: Has been taking 50,000 units of vitamin D on a weekly basis for the past 3 months. She states that after 2-3 weeks of taking this she noticed a drastic improvement of her energy. She is now running most days of the week to stay active. She denies any known side effects from the medication.  Follow-up subclinical hypothyroidism: Taking 25 MCG of levothyroxine daily. She's noticed some dry skin around her face and she is wondering if maybe the dose needs to be adjusted. Denies any unintentional weight loss or gain but reports difficulty losing weight despite counting calories, cutting out alcohol, avoiding refined sugar and staying active on a daily basis. Denies gastrointestinal complaints  Review Of Systems Outlined In HPI  Past Medical History  Diagnosis Date  . Abnormal Pap smear of cervix   . Hypothyroidism     Past Surgical History  Procedure Laterality Date  . Tonsillectomy    . Breast reduction surgery    . Lymph node dissection    . Colposcopy    . Cervical biopsy  w/ loop electrode excision  2004  . Wisdom tooth extraction  1995  . Adenoidectomy     Family History  Problem Relation Age of Onset  . Hypertension Mother   . Hypertension Father   . Cancer Father     prostate  . Cancer Maternal Grandmother     rectal  . Diabetes Maternal Grandfather   . Breast cancer Paternal Grandmother 3  . Heart attack Paternal Grandfather     Social History   Social History  . Marital Status: Single    Spouse Name: N/A  . Number of Children: N/A  . Years of Education: N/A   Occupational History  . Not on file.   Social History Main Topics  . Smoking status: Never Smoker   . Smokeless tobacco: Never Used  . Alcohol Use: 1.2 - 1.8 oz/week    2-3 Standard drinks or equivalent per week  . Drug Use: No  . Sexual Activity:    Partners: Male    Birth Control/ Protection:  Abstinence, Condom   Other Topics Concern  . Not on file   Social History Narrative     Objective: BP 112/72 mmHg  Pulse 72  Wt 175 lb (79.379 kg)  Vital signs reviewed. General: Alert and Oriented, No Acute Distress HEENT: Pupils equal, round, reactive to light. Conjunctivae clear.  External ears unremarkable.  Moist mucous membranes. Lungs: Clear and comfortable work of breathing, speaking in full sentences without accessory muscle use. Cardiac: Regular rate and rhythm.  Neuro: CN II-XII grossly intact, gait normal. Extremities: No peripheral edema.  Strong peripheral pulses.  Mental Status: No depression, anxiety, nor agitation. Logical though process. Skin: Warm and dry.  Assessment & Plan: Tara Rosales was seen today for follow-up.  Diagnoses and all orders for this visit:  Vitamin D deficiency -     Vit D  25 hydroxy (rtn osteoporosis monitoring)  Subclinical hypothyroidism -     TSH -     T4, free  Vitamin D deficiency: Clinically improved checking levels again and if above 30 can stick to over-the-counter preparations. Subclinical hypothyroidism: Repeating TSH and T4, continue 25 MCG of levothyroxine pending results.   Return in about 3 months (around 12/08/2015).

## 2015-09-08 LAB — VITAMIN D 25 HYDROXY (VIT D DEFICIENCY, FRACTURES): Vit D, 25-Hydroxy: 43 ng/mL (ref 30–100)

## 2015-12-08 ENCOUNTER — Ambulatory Visit (INDEPENDENT_AMBULATORY_CARE_PROVIDER_SITE_OTHER): Payer: 59 | Admitting: Family Medicine

## 2015-12-08 ENCOUNTER — Encounter: Payer: Self-pay | Admitting: Family Medicine

## 2015-12-08 VITALS — BP 103/75 | HR 63 | Wt 172.0 lb

## 2015-12-08 DIAGNOSIS — E038 Other specified hypothyroidism: Secondary | ICD-10-CM

## 2015-12-08 DIAGNOSIS — J329 Chronic sinusitis, unspecified: Secondary | ICD-10-CM | POA: Diagnosis not present

## 2015-12-08 DIAGNOSIS — A499 Bacterial infection, unspecified: Secondary | ICD-10-CM

## 2015-12-08 DIAGNOSIS — B9689 Other specified bacterial agents as the cause of diseases classified elsewhere: Secondary | ICD-10-CM

## 2015-12-08 DIAGNOSIS — E039 Hypothyroidism, unspecified: Secondary | ICD-10-CM

## 2015-12-08 MED ORDER — LEVOFLOXACIN 500 MG PO TABS: 500.0000 mg | ORAL_TABLET | Freq: Every day | ORAL | Status: DC

## 2015-12-08 MED FILL — levoFLOXacin 500 MG TABS: 500 | 8 days supply | Qty: 8 | Fill #0

## 2015-12-08 NOTE — Progress Notes (Signed)
CC: Dewitt HoesJessica Rosales is a 35 y.o. female is here for Hypothyroidism and Sinusitis   Subjective: HPI:  Follow-up hypothyroidism: She is taking levothyroxine 100% compliance. She denies any hair changes, skin changes or bowel changes. She denies diarrhea or constipation. She's been feeling great up until last week when she had an episode of vomiting followed by persistent facial pressure, nasal congestion, sore throat and postnasal drip. Symptoms are not improved with Mucinex. No other interventions as of yet. She's also felt somewhat feverish but when she checks her temperature it's never above 100.3. Denies cough, wheezing or chest pain   Review Of Systems Outlined In HPI  Past Medical History  Diagnosis Date  . Abnormal Pap smear of cervix   . Hypothyroidism     Past Surgical History  Procedure Laterality Date  . Tonsillectomy    . Breast reduction surgery    . Lymph node dissection    . Colposcopy    . Cervical biopsy  w/ loop electrode excision  2004  . Wisdom tooth extraction  1995  . Adenoidectomy     Family History  Problem Relation Age of Onset  . Hypertension Mother   . Hypertension Father   . Cancer Father     prostate  . Cancer Maternal Grandmother     rectal  . Diabetes Maternal Grandfather   . Breast cancer Paternal Grandmother [phone]  . Heart attack Paternal Grandfather     Social History   Social History  . Marital Status: Single    Spouse Name: N/A  . Number of Children: N/A  . Years of Education: N/A   Occupational History  . Not on file.   Social History Main Topics  . Smoking status: Never Smoker   . Smokeless tobacco: Never Used  . Alcohol Use: 1.2 - 1.8 oz/week    2-3 Standard drinks or equivalent per week  . Drug Use: No  . Sexual Activity:    Partners: Male    Birth Control/ Protection: Abstinence, Condom   Other Topics Concern  . Not on file   Social History Narrative     Objective: BP 103/75 mmHg  Pulse 63  Wt 172 lb (78.019  kg)  General: Alert and Oriented, No Acute Distress HEENT: Pupils equal, round, reactive to light. Conjunctivae clear.  External ears unremarkable, canals clear with intact TMs with appropriate landmarks.  Middle ear appears open without mild serous effusion bilaterally. Pink inferior turbinates.  Moist mucous membranes, pharynx without inflammation nor lesions.  Single anterior chain left tender lymph node Lungs: Clear to auscultation bilaterally, no wheezing/ronchi/rales.  Comfortable work of breathing. Good air movement. Cardiac: Regular rate and rhythm. Normal S1/S2.  No murmurs, rubs, nor gallops.   Extremities: No peripheral edema.  Strong peripheral pulses.  Mental Status: No depression, anxiety, nor agitation. Skin: Warm and dry.  Assessment & Plan: [name] was seen today for hypothyroidism and sinusitis.  Diagnoses and all orders for this visit:  Subclinical hypothyroidism -     TSH  Bacterial sinusitis -     levofloxacin (LEVAQUIN) 500 MG tablet; Take 1 tablet (500 mg total) by mouth daily.   Hypothyroidism: Clinically controlled to for TSH and if normal will space out rechecking every /6 months Bacterial sinusitis:start Levaquin, call if no better by the end of the week next step would be prednisone  Return if symptoms worsen or fail to improve.

## 2015-12-09 ENCOUNTER — Other Ambulatory Visit: Payer: Self-pay

## 2015-12-09 LAB — TSH: TSH: 2.905 u[IU]/mL (ref 0.350–4.500)

## 2015-12-11 ENCOUNTER — Telehealth: Payer: Self-pay

## 2015-12-11 MED ORDER — LEVOTHYROXINE SODIUM 25 MCG PO TABS: 25.0000 ug | ORAL_TABLET | Freq: Every day | ORAL | Status: DC

## 2015-12-11 NOTE — Telephone Encounter (Signed)
Refill sent to pharmacy.   

## 2016-01-06 ENCOUNTER — Ambulatory Visit (INDEPENDENT_AMBULATORY_CARE_PROVIDER_SITE_OTHER): Payer: 59 | Admitting: Family Medicine

## 2016-01-06 ENCOUNTER — Encounter: Payer: Self-pay | Admitting: Family Medicine

## 2016-01-06 VITALS — BP 107/76 | HR 80 | Temp 98.7°F | Wt 175.0 lb

## 2016-01-06 DIAGNOSIS — J4520 Mild intermittent asthma, uncomplicated: Secondary | ICD-10-CM

## 2016-01-06 MED ORDER — DOXYCYCLINE HYCLATE 100 MG PO TABS: ORAL_TABLET | ORAL | Status: AC

## 2016-01-06 MED ORDER — PREDNISONE 20 MG PO TABS: ORAL_TABLET | ORAL | Status: AC

## 2016-01-06 MED FILL — predniSONE 20 MG TABS: 20 | 5 days supply | Qty: 15 | Fill #0

## 2016-01-06 MED FILL — DOXYCYCLINE 100 MG TABLET: 100 | 10 days supply | Qty: 20 | Fill #0

## 2016-01-06 NOTE — Progress Notes (Signed)
CC: Tara Rosales is a 35 y.o. female is here for Sinusitis   Subjective: HPI:  Mild shortness of breath, postnasal drip, nonproductive cough present ever since the weekend. Slowly worsening. She was feeling great after finishing levofloxacin only for this to start out of nowhere. She had a fever of 100.3 yesterday. She reports fatigue. Mild benefit from Tylenol. Nothing else and made better or worse. Present all hours the day. Has not tried albuterol yet. Denies chest pain, rash, headache or joint pain.   Review Of Systems Outlined In HPI  Past Medical History  Diagnosis Date  . Abnormal Pap smear of cervix   . Hypothyroidism     Past Surgical History  Procedure Laterality Date  . Tonsillectomy    . Breast reduction surgery    . Lymph node dissection    . Colposcopy    . Cervical biopsy  w/ loop electrode excision  2004  . Wisdom tooth extraction  1995  . Adenoidectomy     Family History  Problem Relation Age of Onset  . Hypertension Mother   . Hypertension Father   . Cancer Father     prostate  . Cancer Maternal Grandmother     rectal  . Diabetes Maternal Grandfather   . Breast cancer Paternal Grandmother 47  . Heart attack Paternal Grandfather     Social History   Social History  . Marital Status: Single    Spouse Name: N/A  . Number of Children: N/A  . Years of Education: N/A   Occupational History  . Not on file.   Social History Main Topics  . Smoking status: Never Smoker   . Smokeless tobacco: Never Used  . Alcohol Use: 1.2 - 1.8 oz/week    2-3 Standard drinks or equivalent per week  . Drug Use: No  . Sexual Activity:    Partners: Male    Birth Control/ Protection: Abstinence, Condom   Other Topics Concern  . Not on file   Social History Narrative     Objective: BP 107/76 mmHg  Pulse 80  Temp(Src) 98.7 F (37.1 C) (Oral)  Wt 175 lb (79.379 kg)  SpO2 100%  General: Alert and Oriented, No Acute Distress HEENT: Pupils equal, round,  reactive to light. Conjunctivae clear.  External ears unremarkable, canals clear with intact TMs with appropriate landmarks.  Middle ear appears open without effusion. Pink inferior turbinates.  Moist mucous membranes, pharynx without inflammation nor lesions.  Neck supple without palpable lymphadenopathy nor abnormal masses. Lungs: Clear to auscultation bilaterally, no wheezing/ronchi/rales.  Comfortable work of breathing. Good air movement. Frequent coughing Extremities: No peripheral edema.  Strong peripheral pulses.  Mental Status: No depression, anxiety, nor agitation. Skin: Warm and dry.  Assessment & Plan: Briseida was seen today for sinusitis.  Diagnoses and all orders for this visit:  Asthma, mild intermittent, uncomplicated -     doxycycline (VIBRA-TABS) 100 MG tablet; One by mouth twice a day for ten days. -     predniSONE (DELTASONE) 20 MG tablet; Three tabs at once daily for five days.   Mild asthma exacerbation, start doxycycline and prednisone. Encouraged to try albuterol that she has at home. Call if no better by Friday.  Return if symptoms worsen or fail to improve. adsf

## 2016-03-03 ENCOUNTER — Encounter: Payer: Self-pay | Admitting: Family Medicine

## 2016-03-03 ENCOUNTER — Ambulatory Visit (INDEPENDENT_AMBULATORY_CARE_PROVIDER_SITE_OTHER): Payer: 59 | Admitting: Family Medicine

## 2016-03-03 VITALS — BP 111/79 | HR 73 | Wt 174.0 lb

## 2016-03-03 DIAGNOSIS — E038 Other specified hypothyroidism: Secondary | ICD-10-CM

## 2016-03-03 DIAGNOSIS — G478 Other sleep disorders: Secondary | ICD-10-CM

## 2016-03-03 DIAGNOSIS — E039 Hypothyroidism, unspecified: Secondary | ICD-10-CM

## 2016-03-03 LAB — VITAMIN B12: Vitamin B-12: 1177 pg/mL — ABNORMAL HIGH (ref 200–1100)

## 2016-03-03 LAB — T4, FREE: FREE T4: 1.3 ng/dL (ref 0.8–1.8)

## 2016-03-03 LAB — T3, FREE: T3 FREE: 2.6 pg/mL (ref 2.3–4.2)

## 2016-03-03 LAB — TSH: TSH: 1.85 mIU/L

## 2016-03-03 NOTE — Progress Notes (Signed)
CC: Tara Rosales is a 35 y.o. female is here for Hypothyroidism   Subjective: HPI:  Follow-up hypothyroidism: She tells me that on a daily basis she feels like she didn't get restful sleep and has difficulty getting motivated to get up. Throughout the day she feels fatigued and also feels like she's in a mental fogginess to a mild degree. She also has some bowel irregularities with constipation and is concerned that her thyroid function is not adequately addressed. She's had some dry skin but no other skin or hair complaints. Symptoms are mild to moderate in severity and nothing seems to make them better or worse. She is taking a multivitamin with vitamin D and B., a sleep study for similar symptoms in the past year was normal. Denies focal weakness, unintentional weight loss or fever/chills.   Review Of Systems Outlined In HPI  Past Medical History  Diagnosis Date  . Abnormal Pap smear of cervix   . Hypothyroidism     Past Surgical History  Procedure Laterality Date  . Tonsillectomy    . Breast reduction surgery    . Lymph node dissection    . Colposcopy    . Cervical biopsy  w/ loop electrode excision  2004  . Wisdom tooth extraction  1995  . Adenoidectomy     Family History  Problem Relation Age of Onset  . Hypertension Mother   . Hypertension Father   . Cancer Father     prostate  . Cancer Maternal Grandmother     rectal  . Diabetes Maternal Grandfather   . Breast cancer Paternal Grandmother 57  . Heart attack Paternal Grandfather     Social History   Social History  . Marital Status: Single    Spouse Name: N/A  . Number of Children: N/A  . Years of Education: N/A   Occupational History  . Not on file.   Social History Main Topics  . Smoking status: Never Smoker   . Smokeless tobacco: Never Used  . Alcohol Use: 1.2 - 1.8 oz/week    2-3 Standard drinks or equivalent per week  . Drug Use: No  . Sexual Activity:    Partners: Male    Birth Control/  Protection: Abstinence, Condom   Other Topics Concern  . Not on file   Social History Narrative     Objective: BP 111/79 mmHg  Pulse 73  Wt 174 lb (78.926 kg)  General: Alert and Oriented, No Acute Distress HEENT: Pupils equal, round, reactive to light. Conjunctivae clear.  External ears unremarkable, canals clear with intact TMs with appropriate landmarks.  Middle ear appears open without effusion. Pink inferior turbinates.  Moist mucous membranes, pharynx without inflammation nor lesions.  Neck supple without palpable lymphadenopathy nor abnormal masses. Lungs: Clear to auscultation bilaterally, no wheezing/ronchi/rales.  Comfortable work of breathing. Good air movement. Cardiac: Regular rate and rhythm. Normal S1/S2.  No murmurs, rubs, nor gallops.   Extremities: No peripheral edema.  Strong peripheral pulses.  Mental Status: No depression, anxiety, nor agitation. Skin: Warm and dry.  Assessment & Plan: Tara Rosales was seen today for hypothyroidism.  Diagnoses and all orders for this visit:  Subclinical hypothyroidism -     TSH -     T4, free -     T3, free  Non-restorative sleep -     Vitamin B12   Hypothyroidism is questionably controlled therefore checking the above labs. Rule out vitamin B12 deficiency as well.  25 minutes spent face-to-face during visit today of  which at least 50% was counseling or coordinating care regarding: 1. Subclinical hypothyroidism   2. Non-restorative sleep       Return if symptoms worsen or fail to improve.

## 2016-03-04 ENCOUNTER — Telehealth: Payer: Self-pay | Admitting: Family Medicine

## 2016-03-04 ENCOUNTER — Other Ambulatory Visit: Payer: Self-pay

## 2016-03-04 MED ORDER — SYNTHROID 25 MCG PO TABS: 25.0000 ug | ORAL_TABLET | Freq: Every day | ORAL | Status: DC

## 2016-03-04 NOTE — Telephone Encounter (Signed)
Will you please let patient know that her thyroid and B12 labs were normal, as discussed I've asked the [facility] in [place] to now dispense brand name synthroid to see if this helps her fatigue.

## 2016-05-30 ENCOUNTER — Other Ambulatory Visit: Payer: Self-pay | Admitting: Family Medicine

## 2016-06-06 ENCOUNTER — Ambulatory Visit: Payer: 59 | Admitting: Family Medicine

## 2016-08-04 ENCOUNTER — Encounter: Payer: Self-pay | Admitting: Certified Nurse Midwife

## 2016-08-09 ENCOUNTER — Telehealth: Payer: Self-pay | Admitting: Behavioral Health

## 2016-08-09 ENCOUNTER — Encounter: Payer: Self-pay | Admitting: Behavioral Health

## 2016-08-09 NOTE — Telephone Encounter (Signed)
Pre-Visit Call completed with patient and chart updated.   Pre-Visit Info documented in Specialty Comments under [employer].    

## 2016-08-10 ENCOUNTER — Ambulatory Visit (INDEPENDENT_AMBULATORY_CARE_PROVIDER_SITE_OTHER): Payer: 59 | Admitting: Family Medicine

## 2016-08-10 ENCOUNTER — Encounter: Payer: Self-pay | Admitting: Family Medicine

## 2016-08-10 VITALS — BP 92/58 | HR 73 | Temp 98.5°F | Ht 67.5 in | Wt 170.6 lb

## 2016-08-10 DIAGNOSIS — E039 Hypothyroidism, unspecified: Secondary | ICD-10-CM | POA: Diagnosis not present

## 2016-08-10 DIAGNOSIS — Z23 Encounter for immunization: Secondary | ICD-10-CM

## 2016-08-10 LAB — TSH: TSH: 4.49 u[IU]/mL (ref 0.35–4.50)

## 2016-08-10 LAB — T4, FREE: FREE T4: 1.05 ng/dL (ref 0.60–1.60)

## 2016-08-10 MED ORDER — LEVOTHYROXINE SODIUM 25 MCG PO TABS: 37.5000 ug | ORAL_TABLET | Freq: Every day | ORAL | 1 refills | Status: DC

## 2016-08-10 NOTE — Patient Instructions (Addendum)

## 2016-08-10 NOTE — Addendum Note (Signed)
Addended by: Verdie ShireBAYNES, Aristeo Hankerson M on: 08/10/2016 08:35 AM   Modules accepted: Orders

## 2016-08-10 NOTE — Progress Notes (Signed)
Pre visit review using our clinic review tool, if applicable. No additional management support is needed unless otherwise documented below in the visit note. 

## 2016-08-10 NOTE — Progress Notes (Signed)
Chief Complaint  Patient presents with  . Establish Care    medication refill on thyroid med       New Patient Visit SUBJECTIVE: HPI: Tara Rosales is an 35 y.o.female who is being seen for establishing care.  [facility]- her PCP is moving away, Dr. Genelle BalS Hommel.  Health maintenance history: Pap/pelvic: Nml in 2016 HIV screening: 3 years ago, normal Tetanus: 12/2015 Flu shot: Last year  Concerns: Hypothyroidism She has had low thyroid for 3.5 years. Had hair loss, skin changes, weight changes, and She is on 25 mcg of Synthroid and is tolerating it well with no side effects. She believes her dose should remain increased. She is having some symptoms of continued fatigue. OSA, Vit D deficiency and depression were ruled out by previous PCP. She does work out routinely. +Family hx of hypothyroid with dad and a few uncles, but is unsure exactly what the original cause is.  Allergies  Allergen Reactions  . Penicillins Hives  . Sulfur Hives    Past Medical History:  Diagnosis Date  . Abnormal Pap smear of cervix   . Allergy   . History of chicken pox   . Hypothyroidism    Past Surgical History:  Procedure Laterality Date  . ADENOIDECTOMY    . BREAST REDUCTION SURGERY    . CERVICAL BIOPSY  W/ LOOP ELECTRODE EXCISION  2004  . COLPOSCOPY    . LYMPH NODE DISSECTION    . TONSILLECTOMY    . WISDOM TOOTH EXTRACTION  1995   Social History   Social History  . Marital status: Single   Social History Main Topics  . Smoking status: Never Smoker  . Smokeless tobacco: Never Used  . Alcohol use 1.2 - 1.8 oz/week    2 - 3 Standard drinks or equivalent per week  . Drug use: No  . Sexual activity: Not Currently    Partners: Male    Birth control/ protection: Abstinence, Condom   Family History  Problem Relation Age of Onset  . Hypertension Mother   . Hypertension Father   . Cancer Father     prostate  . Cancer Maternal Grandmother      rectal  . Breast cancer Paternal Grandmother [id number]  . Heart attack Paternal Grandfather   . Diabetes Maternal Grandfather     Current Outpatient Prescriptions:  .  B COMPLEX VITAMINS SL, Place 1 mL under the tongue daily., Disp: , Rfl:  .  Biotin 1000 MCG tablet, Take 1,000 mcg by mouth daily. , Disp: , Rfl:  .  Calcium Carb-Cholecalciferol (CALCIUM 1000 + D) 1000-800 MG-UNIT TABS, Take 4 capsules by mouth daily., Disp: , Rfl:  .  cetirizine (ZYRTEC) 10 MG tablet, Take 10 mg by mouth as needed for allergies., Disp: , Rfl:  .  Multiple Vitamin (MULTIVITAMIN) tablet, Take 1 tablet by mouth daily., Disp: , Rfl:  .  levothyroxine (SYNTHROID) 25 MCG tablet, Take 1.5 tablets (37.5 mcg total) by mouth daily before breakfast., Disp: 135 tablet, Rfl: 1  Patient's last menstrual period was 07/16/2016.  ROS Consitutional: Denies unexpected weight changes  Endo: Denies heat/cold intolerance or masses in neck   OBJECTIVE: BP (!) 92/58 (BP Location: Left Arm, Patient Position: Sitting, Cuff Size: Normal)   Pulse 73   Temp 98.5 F (36.9 C) (Oral)   Ht 5' 7.5" (1.715 m)   Wt 170 lb 9.6 oz (77.4 kg)   LMP 07/16/2016   SpO2 99%  BMI 26.33 kg/m   Constitutional: -  VS reviewed -  Well developed, well nourished, appears stated age -  No apparent distress  Psychiatric: -  Oriented to person, place, and time -  Memory intact -  Affect and mood normal -  Fluent conversation, good eye contact -  Judgment and insight age appropriate  Eye: -  Conjunctivae clear, no discharge -  Pupils symmetric, round, reactive to light  ENMT: -  Ears are patent b/l without erythema or discharge. TM's are shiny and clear b/l without evidence of effusion or infection. -  Oral mucosa without lesions, tongue and uvula midline    Tonsils not enlarged, no erythema, no exudate, trachea midline    Pharynx moist, no lesions, no erythema  Neck: -  No gross swelling, no palpable masses -  Thyroid midline, not enlarged,  mobile, no palpable masses  Cardiovascular: -  RRR, no murmurs -  No LE edema  Respiratory: -  Normal respiratory effort, no accessory muscle use, no retraction -  Breath sounds equal, no wheezes, no ronchi, no crackles  Gastrointestinal: -  Bowel sounds normal -  No tenderness, no distention, no guarding, no masses  Neurological:  -  CN II - XII grossly intact -  Sensation grossly intact to light touch, equal bilaterally  Musculoskeletal: -  No clubbing, no cyanosis -  Gait normal  Skin: -  No significant lesion on inspection -  Warm and dry to palpation   ASSESSMENT/PLAN: Hypothyroidism, unspecified hypothyroidism type - Plan: TSH, T4, free, levothyroxine (SYNTHROID) 25 MCG tablet  Orders as above. Will try increased dose. If she feel significantly better, may keep dose regardless of labs. Patient should return in 6 weeks. The patient voiced understanding and agreement to the plan.   Jilda Roche [place]

## 2016-08-11 ENCOUNTER — Ambulatory Visit: Payer: Managed Care, Other (non HMO) | Admitting: Certified Nurse Midwife

## 2016-08-30 ENCOUNTER — Encounter: Payer: Self-pay | Admitting: Certified Nurse Midwife

## 2016-08-30 ENCOUNTER — Ambulatory Visit (INDEPENDENT_AMBULATORY_CARE_PROVIDER_SITE_OTHER): Payer: 59 | Admitting: Certified Nurse Midwife

## 2016-08-30 VITALS — BP 110/64 | HR 70 | Resp 16 | Ht 67.25 in | Wt 174.0 lb

## 2016-08-30 DIAGNOSIS — Z0000 Encounter for general adult medical examination without abnormal findings: Secondary | ICD-10-CM | POA: Diagnosis not present

## 2016-08-30 DIAGNOSIS — Z124 Encounter for screening for malignant neoplasm of cervix: Secondary | ICD-10-CM

## 2016-08-30 DIAGNOSIS — Z01419 Encounter for gynecological examination (general) (routine) without abnormal findings: Secondary | ICD-10-CM | POA: Diagnosis not present

## 2016-08-30 LAB — POCT URINALYSIS DIPSTICK
Bilirubin, UA: NEGATIVE
Blood, UA: NEGATIVE
Glucose, UA: NEGATIVE
Ketones, UA: NEGATIVE
LEUKOCYTES UA: NEGATIVE
NITRITE UA: NEGATIVE
PH UA: 5
PROTEIN UA: NEGATIVE
UROBILINOGEN UA: NEGATIVE

## 2016-08-30 NOTE — Progress Notes (Signed)
Encounter reviewed [name], MD   

## 2016-08-30 NOTE — Patient Instructions (Signed)
EXERCISE AND DIET:  We recommended that you start or continue a regular exercise program for good health. Regular exercise means any activity that makes your heart beat faster and makes you sweat.  We recommend exercising at least 30 minutes per day at least 3 days a week, preferably 4 or 5.  We also recommend a diet low in fat and sugar.  Inactivity, poor dietary choices and obesity can cause diabetes, heart attack, stroke, and kidney damage, among others.    ALCOHOL AND SMOKING:  Women should limit their alcohol intake to no more than 7 drinks/beers/glasses of wine (combined, not each!) per week. Moderation of alcohol intake to this level decreases your risk of breast cancer and liver damage. And of course, no recreational drugs are part of a healthy lifestyle.  And absolutely no smoking or even second hand smoke. Most people know smoking can cause heart and lung diseases, but did you know it also contributes to weakening of your bones? Aging of your skin?  Yellowing of your teeth and nails?  CALCIUM AND VITAMIN D:  Adequate intake of calcium and Vitamin D are recommended.  The recommendations for exact amounts of these supplements seem to change often, but generally speaking 600 mg of calcium (either carbonate or citrate) and 800 units of Vitamin D per day seems prudent. Certain women may benefit from higher intake of Vitamin D.  If you are among these women, your doctor will have told you during your visit.    PAP SMEARS:  Pap smears, to check for cervical cancer or precancers,  have traditionally been done yearly, although recent scientific advances have shown that most women can have pap smears less often.  However, every woman still should have a physical exam from her gynecologist every year. It will include a breast check, inspection of the vulva and vagina to check for abnormal growths or skin changes, a visual exam of the cervix, and then an exam to evaluate the size and shape of the uterus and  ovaries.  And after 37 years of age, a rectal exam is indicated to check for rectal cancers. We will also provide age appropriate advice regarding health maintenance, like when you should have certain vaccines, screening for sexually transmitted diseases, bone density testing, colonoscopy, mammograms, etc.   MAMMOGRAMS:  All women over 40 years old should have a yearly mammogram. Many facilities now offer a "3D" mammogram, which may cost around $50 extra out of pocket. If possible,  we recommend you accept the option to have the 3D mammogram performed.  It both reduces the number of women who will be called back for extra views which then turn out to be normal, and it is better than the routine mammogram at detecting truly abnormal areas.    COLONOSCOPY:  Colonoscopy to screen for colon cancer is recommended for all women at age 50.  We know, you hate the idea of the prep.  We agree, BUT, having colon cancer and not knowing it is worse!!  Colon cancer so often starts as a polyp that can be seen and removed at colonscopy, which can quite literally save your life!  And if your first colonoscopy is normal and you have no family history of colon cancer, most women don't have to have it again for 10 years.  Once every ten years, you can do something that may end up saving your life, right?  We will be happy to help you get it scheduled when you are ready.    Be sure to check your insurance coverage so you understand how much it will cost.  It may be covered as a preventative service at no cost, but you should check your particular policy.      Dysmenorrhea Dysmenorrhea is pain during a menstrual period. You will have pain in the lower belly (abdomen). The pain is caused by the tightening (contracting) of the muscles of the uterus. The pain can be minor or severe. Headache, feeling sick to your stomach (nausea), throwing up (vomiting), or low back pain may occur with this condition. HOME CARE  Only take medicine  as told by your doctor.  Place a heating pad or hot water bottle on your lower back or belly. Do not sleep with a heating pad.  Exercise may help lessen the pain.  Massage the lower back or belly.  Stop smoking.  Avoid alcohol and caffeine. GET HELP IF:   Your pain does not get better with medicine.  You have pain during sex.  Your pain gets worse while taking pain medicine.  Your period bleeding is heavier than normal.  You keep feeling sick to your stomach or keep throwing up. GET HELP RIGHT AWAY IF: You pass out (faint).   This information is not intended to replace advice given to you by your health care provider. Make sure you discuss any questions you have with your health care provider.   Document Released: 02/17/2009 Document Revised: 11/26/2013 Document Reviewed: 05/09/2013 Elsevier Interactive Patient Education [employer]2016 Elsevier Inc.

## 2016-08-30 NOTE — Progress Notes (Signed)
35 y.o. G0P0000 Single  Caucasian Fe here for annual exam. Periods normal , with some increase in cramping, but not every period. Use OTC with problems. Will advise if change or increase in cramping. Not sexually active. Sees Dr. Carnella GuadalajaraWedling PCP for aex, hypothyroid management, labs. Has not been stable with increase dose. Has follow up in one month. Exercises daily and watches weight. No health issues today.   Patient's last menstrual period was 08/13/2016 (exact date).          Sexually active: No.  The current method of family planning is abstinence.    Exercising: Yes.    walking, running,weights & yoga Smoker:  no  Health Maintenance: Pap:  07-31-15 neg HPV HR neg,  hx of LEEP 2004 Last abnormal was 9 years ago with negative biopsy MMG:  none Colonoscopy:  none BMD:   none TDaP:  2017 Shingles: no Pneumonia: no Hep C and HIV:  Neg 1015yrs ago Labs: poct urine-neg Self breast exam: done monthly   reports that she has never smoked. She has never used smokeless tobacco. She reports that she does not drink alcohol or use drugs.  Past Medical History:  Diagnosis Date  . Abnormal Pap smear of cervix   . Allergy   . History of chicken pox   . Hypothyroidism     Past Surgical History:  Procedure Laterality Date  . ADENOIDECTOMY    . BREAST REDUCTION SURGERY    . CERVICAL BIOPSY  W/ LOOP ELECTRODE EXCISION  2004  . COLPOSCOPY    . LYMPH NODE DISSECTION    . TONSILLECTOMY    . WISDOM TOOTH EXTRACTION  1995    Current Outpatient Prescriptions  Medication Sig Dispense Refill  . B COMPLEX VITAMINS SL Place 1 mL under the tongue daily.    . Biotin 1000 MCG tablet Take 1,000 mcg by mouth daily.     . Calcium Carb-Cholecalciferol (CALCIUM 1000 + D) 1000-800 MG-UNIT TABS Take 4 capsules by mouth daily.    . cetirizine (ZYRTEC) 10 MG tablet Take 10 mg by mouth as needed for allergies.    Marland Kitchen. levothyroxine (SYNTHROID) 25 MCG tablet Take 1.5 tablets (37.5 mcg total) by mouth daily before  breakfast. 135 tablet 1  . Multiple Vitamin (MULTIVITAMIN) tablet Take 1 tablet by mouth daily.     No current facility-administered medications for this visit.     Family History  Problem Relation Age of Onset  . Hypertension Mother   . Hypertension Father   . Cancer Father     prostate  . Cancer Maternal Grandmother     rectal  . Breast cancer Paternal Grandmother [id number]  . Heart attack Paternal Grandfather   . Diabetes Maternal Grandfather     ROS:  Pertinent items are noted in HPI.  Otherwise, a comprehensive ROS was negative.  Exam:   BP 110/64   Pulse 70   Resp 16   Ht 5' 7.25" (1.708 m)   Wt 174 lb (78.9 kg)   LMP 08/13/2016 (Exact Date)   BMI 27.05 kg/m  Height: 5' 7.25" (170.8 cm) Ht Readings from Last 3 Encounters:  08/30/16 5' 7.25" (1.708 m)  08/10/16 5' 7.5" (1.715 m)  07/31/15 5\' 7"  (1.702 m)    General appearance: alert, cooperative and appears stated age Head: Normocephalic, without obvious abnormality, atraumatic Neck: no adenopathy, supple, symmetrical, trachea midline and thyroid normal to inspection and palpation Lungs: clear to auscultation bilaterally Breasts: normal appearance, no masses or tenderness, No nipple  retraction or dimpling, No nipple discharge or bleeding, No axillary or supraclavicular adenopathy Heart: regular rate and rhythm Abdomen: soft, non-tender; no masses,  no organomegaly Extremities: extremities normal, atraumatic, no cyanosis or edema Skin: Skin color, texture, turgor normal. No rashes or lesions Lymph nodes: Cervical, supraclavicular, and axillary nodes normal. No abnormal inguinal nodes palpated Neurologic: Grossly normal   Pelvic: External genitalia:  no lesions              Urethra:  normal appearing urethra with no masses, tenderness or lesions              Bartholin's and Skene's: normal                 Vagina: normal appearing vagina with normal color and discharge, no lesions              Cervix: no bleeding  following Pap, no cervical motion tenderness and no lesions              Pap taken: Yes.   Bimanual Exam:  Uterus:  normal size, contour, position, consistency, mobility, non-tender              Adnexa: normal adnexa and no mass, fullness, tenderness               Rectovaginal: Confirms               Anus:  normal sphincter tone, no lesions  Chaperone present: yes  A:  Well Woman with normal exam  Contraception none needed,not sexually active  Dysmenorrhea at times with menses, OTC medication relieves  History of abnormal pap smear with LEEP 2004, negative colpo in 2009  Hypothyroid with PCP management, unstable at present under follow up  P:   Reviewed health and wellness pertinent to exam  Will advise if needed.  Discussed warning signs of dysmenorrhea and will advise if changes. Handout given  Stressed importance of aex and pap smear as indicated  Continue with MD as indicated  Pap smear as above with HPV reflex   counseled on breast self exam, STD prevention, HIV risk factors and prevention, adequate intake of calcium and vitamin D, diet and exercise  return annually or prn  An After Visit Summary was printed and given to the patient.

## 2016-09-01 LAB — IPS PAP TEST WITH REFLEX TO HPV

## 2016-09-19 ENCOUNTER — Ambulatory Visit (INDEPENDENT_AMBULATORY_CARE_PROVIDER_SITE_OTHER): Payer: PRIVATE HEALTH INSURANCE | Admitting: Family Medicine

## 2016-09-19 ENCOUNTER — Encounter: Payer: Self-pay | Admitting: Family Medicine

## 2016-09-19 DIAGNOSIS — E039 Hypothyroidism, unspecified: Secondary | ICD-10-CM

## 2016-09-19 DIAGNOSIS — Z23 Encounter for immunization: Secondary | ICD-10-CM

## 2016-09-19 DIAGNOSIS — E038 Other specified hypothyroidism: Secondary | ICD-10-CM

## 2016-09-19 DIAGNOSIS — E063 Autoimmune thyroiditis: Secondary | ICD-10-CM

## 2016-09-19 HISTORY — DX: Autoimmune thyroiditis: E06.3

## 2016-09-19 HISTORY — DX: Hypothyroidism, unspecified: E03.9

## 2016-09-19 HISTORY — DX: Other specified hypothyroidism: E03.8

## 2016-09-19 LAB — TSH: TSH: 2.99 u[IU]/mL (ref 0.35–4.50)

## 2016-09-19 LAB — T4, FREE: Free T4: 1.02 ng/dL (ref 0.60–1.60)

## 2016-09-19 MED ORDER — LEVOTHYROXINE SODIUM 25 MCG PO TABS: 37.5000 ug | ORAL_TABLET | Freq: Every day | ORAL | 3 refills | Status: DC

## 2016-09-19 MED FILL — SYNTHROID 25 MCG TABLET: 25 | 30 days supply | Qty: 45 | Fill #0

## 2016-09-19 NOTE — Progress Notes (Signed)
Chief Complaint  Patient presents with  . Follow-up    Subjective: Hypothyroidism Patient presents for follow-up of hypothyroidism.  Reports compliance with medication. Current symptoms include: fatigue- though poor sleep 2/2 noise neighbor; improved since the dose change Denies: feeling cold and cold intolerance, constipation, swelling, losing hair, palpitations and sweating She believes her dose should be unchanged  ROS: Heart: Denies chest pain or palpitations Skin: Denies swelling or skin changes  Family History  Problem Relation Age of Onset  . Hypertension Mother   . Hypertension Father   . Cancer Father     prostate  . Cancer Maternal Grandmother     rectal  . Breast cancer Paternal Grandmother [id number]  . Heart attack Paternal Grandfather   . Diabetes Maternal Grandfather    Past Medical History:  Diagnosis Date  . Abnormal Pap smear of cervix    2004 & 2009  . Allergy   . History of chicken pox   . Hypothyroidism   . Hypothyroidism 09/19/2016   Allergies  Allergen Reactions  . Penicillins Hives  . Sulfur Hives    Current Outpatient Prescriptions:  .  B COMPLEX VITAMINS SL, Place 1 mL under the tongue daily., Disp: , Rfl:  .  Biotin 1000 MCG tablet, Take 1,000 mcg by mouth daily. , Disp: , Rfl:  .  Calcium Carb-Cholecalciferol (CALCIUM 1000 + D) 1000-800 MG-UNIT TABS, Take 4 capsules by mouth daily., Disp: , Rfl:  .  cetirizine (ZYRTEC) 10 MG tablet, Take 10 mg by mouth as needed for allergies., Disp: , Rfl:  .  Multiple Vitamin (MULTIVITAMIN) tablet, Take 1 tablet by mouth daily., Disp: , Rfl:  .  levothyroxine (SYNTHROID) 25 MCG tablet, Take 1.5 tablets (37.5 mcg total) by mouth daily before breakfast., Disp: 45 tablet, Rfl: 3  Objective: BP (!) 98/56 (BP Location: Left Arm, Patient Position: Sitting, Cuff Size: Small)   Pulse 90   Temp 98.5 F (36.9 C) (Oral)   Ht 5\' 7"  (1.702 m)   Wt 180 lb 9.6 oz (81.9 kg)   LMP 09/09/2016   SpO2 99%   BMI 28.29  kg/m  General: Awake, appears stated age HEENT: MMM, EOMi Neck: No masses or asymmetry Heart: RRR, no murmurs, no LE edema Lungs: CTAB, no rales, wheezes or rhonchi. Normal effort Abd: BS+, soft, NT, ND, no masses or organomegaly Psych: Age appropriate judgment and insight, normal affect and mood  Assessment and Plan: Hypothyroidism, unspecified type - Plan: TSH, T4, free, levothyroxine (SYNTHROID) 25 MCG tablet  Encounter for immunization - Plan: Flu vaccine, recombinat, quadrivalent, inj  Orders as above. Continue current dose. Recheck in 3 mo or prn. The patient voiced understanding and agreement to the plan.  Jilda Rocheicholas Paul [place], DO 09/19/16  9:38 AM

## 2016-09-19 NOTE — Progress Notes (Signed)
Pre visit review using our clinic review tool, if applicable. No additional management support is needed unless otherwise documented below in the visit note. 

## 2016-10-20 MED FILL — SYNTHROID 25 MCG TABLET: 25 | 30 days supply | Qty: 45 | Fill #1

## 2016-11-15 MED FILL — SYNTHROID 25 MCG TABLET: 25 | 30 days supply | Qty: 45 | Fill #2

## 2016-12-19 ENCOUNTER — Ambulatory Visit: Payer: 59 | Admitting: Family Medicine

## 2016-12-19 ENCOUNTER — Ambulatory Visit (INDEPENDENT_AMBULATORY_CARE_PROVIDER_SITE_OTHER): Payer: PRIVATE HEALTH INSURANCE | Admitting: Family Medicine

## 2016-12-19 ENCOUNTER — Encounter: Payer: Self-pay | Admitting: Family Medicine

## 2016-12-19 VITALS — BP 98/70 | HR 79 | Temp 98.4°F | Ht 67.0 in | Wt 178.6 lb

## 2016-12-19 DIAGNOSIS — E039 Hypothyroidism, unspecified: Secondary | ICD-10-CM

## 2016-12-19 DIAGNOSIS — J0100 Acute maxillary sinusitis, unspecified: Secondary | ICD-10-CM

## 2016-12-19 LAB — TSH: TSH: 0.9 u[IU]/mL (ref 0.35–4.50)

## 2016-12-19 LAB — T4, FREE: Free T4: 1.08 ng/dL (ref 0.60–1.60)

## 2016-12-19 MED ORDER — LEVOTHYROXINE SODIUM 25 MCG PO TABS: 37.5000 ug | ORAL_TABLET | Freq: Every day | ORAL | 5 refills | Status: DC

## 2016-12-19 MED FILL — SYNTHROID 25 MCG TABLET: 25 | 30 days supply | Qty: 45 | Fill #3

## 2016-12-19 NOTE — Progress Notes (Signed)
Chief Complaint  Patient presents with  . Follow-up    Subjective: Patient is a 36 y.o. female here for hypothyroid f/u. Reports compliance with medication- Synthroid 37.5 mcg daily. 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 Hypertension Mother   . Hypertension Father   . Cancer Father     prostate  . Cancer Maternal Grandmother     rectal  . Breast cancer Paternal Grandmother [id number]  . Heart attack Paternal Grandfather   . Diabetes Maternal Grandfather    Past Medical History:  Diagnosis Date  . Abnormal Pap smear of cervix    2004 & 2009  . Allergy   . History of chicken pox   . Hypothyroidism   . Hypothyroidism 09/19/2016   Allergies  Allergen Reactions  . Penicillins Hives  . Sulfur Hives    Current Outpatient Prescriptions:  .  B COMPLEX VITAMINS SL, Place 1 mL under the tongue daily., Disp: , Rfl:  .  Biotin 1000 MCG tablet, Take 1,000 mcg by mouth daily. , Disp: , Rfl:  .  Calcium Carb-Cholecalciferol (CALCIUM 1000 + D) 1000-800 MG-UNIT TABS, Take 4 capsules by mouth daily., Disp: , Rfl:  .  cetirizine (ZYRTEC) 10 MG tablet, Take 10 mg by mouth as needed for allergies., Disp: , Rfl:  .  levothyroxine (SYNTHROID) 25 MCG tablet, Take 1.5 tablets (37.5 mcg total) by mouth daily before breakfast., Disp: 45 tablet, Rfl: 5 .  Multiple Vitamin (MULTIVITAMIN) tablet, Take 1 tablet by mouth daily., Disp: , Rfl:   Objective: BP 98/70 (BP Location: Left Arm, Patient Position: Sitting, Cuff Size: Small)   Pulse 79    Temp 98.4 F (36.9 C) (Oral)   Ht 5\' 7"  (1.702 m)   Wt 178 lb 9.6 oz (81 kg)   LMP 11/29/2016   SpO2 98%   BMI 27.97 kg/m  General: Awake, appears stated age HEENT: MMM, pharynx pink and without exudate, EOMi, ears neg b/l, nares patent with rhinorrhea, minor TTP over max sinuses b/l,  Heart: RRR, no murmurs Lungs: CTAB, no rales, wheezes or rhonchi. No accessory muscle use Abd: BS+, soft, NT, ND, no masses or organomegaly Psych: Age appropriate judgment and insight, normal affect and mood  Assessment and Plan: Hypothyroidism, unspecified type - Plan: levothyroxine (SYNTHROID) 25 MCG tablet, T4, free, TSH  Acute non-recurrent maxillary sinusitis  Orders as above. Continue current dose of Synthroid 37.5 mcg daily. Recheck thyroid function. F/u in 6 mo. If still doing well, will check yearly. INCS and continued use of PO antihistamine recommended. If symptoms not improved after 1 week, will rx abx.  The patient voiced understanding and agreement to the plan.  Jilda Rocheicholas Paul [place], DO 12/19/16  1:34 PM

## 2016-12-19 NOTE — Progress Notes (Signed)
Pre visit review using our clinic review tool, if applicable. No additional management support is needed unless otherwise documented below in the visit note. 

## 2016-12-19 NOTE — Patient Instructions (Signed)
Claritin (loratadine), Allegra (fexofenadine), Xyzal (levcertirizine); these are listed in order from weakest to strongest. Generic, and therefore cheaper, options are in the parentheses.   Flonase (fluticasone); nasal spray that is over the counter. 2 sprays each nostril, once daily. Aim towards the same side eye when you spray.  There are available OTC, and the generic versions, which may be cheaper, are in parentheses. Show this to a pharmacist if you have trouble finding any of these items.  Let me know if you have symptoms after 1 week.

## 2017-01-19 MED FILL — SYNTHROID 25 MCG TABLET: 25 | 30 days supply | Qty: 45 | Fill #0

## 2017-02-20 MED FILL — SYNTHROID 25 MCG TABLET: 25 | 30 days supply | Qty: 45 | Fill #1

## 2017-02-24 ENCOUNTER — Encounter: Payer: Self-pay | Admitting: Medical

## 2017-02-24 ENCOUNTER — Ambulatory Visit (INDEPENDENT_AMBULATORY_CARE_PROVIDER_SITE_OTHER): Payer: PRIVATE HEALTH INSURANCE | Admitting: Medical

## 2017-02-24 VITALS — BP 109/76 | HR 78 | Temp 98.2°F | Resp 16 | Ht 67.0 in | Wt 171.5 lb

## 2017-02-24 DIAGNOSIS — R059 Cough, unspecified: Secondary | ICD-10-CM

## 2017-02-24 DIAGNOSIS — H6690 Otitis media, unspecified, unspecified ear: Secondary | ICD-10-CM | POA: Diagnosis not present

## 2017-02-24 DIAGNOSIS — R05 Cough: Secondary | ICD-10-CM | POA: Diagnosis not present

## 2017-02-24 DIAGNOSIS — J0100 Acute maxillary sinusitis, unspecified: Secondary | ICD-10-CM | POA: Diagnosis not present

## 2017-02-24 MED ORDER — AZELASTINE HCL 0.1 % NA SOLN: 2.0000 | Freq: Two times a day (BID) | NASAL | 3 refills | Status: DC

## 2017-02-24 MED ORDER — HYDROCODONE-HOMATROPINE 5-1.5 MG/5ML PO SYRP: 5.0000 mL | ORAL_SOLUTION | Freq: Three times a day (TID) | ORAL | 0 refills | Status: DC | PRN

## 2017-02-24 MED ORDER — AZITHROMYCIN 250 MG PO TABS: ORAL_TABLET | ORAL | 0 refills | Status: DC

## 2017-02-24 MED FILL — AZELASTINE 0.1% (137 MCG) S: 0.1 | 25 days supply | Qty: 30 | Fill #0

## 2017-02-24 MED FILL — AZITHROMYCIN 250 MG TABLET: 250 | 5 days supply | Qty: 6 | Fill #0

## 2017-02-24 MED FILL — HYDROCODONE-HOMATROPINE SYR: 5-1.5 | 8 days supply | Qty: 120 | Fill #0

## 2017-02-24 NOTE — Patient Instructions (Addendum)
You appear to have a sinus infection with ear infection. I am prescribing azithromycin antibiotic for the infection. To help with the nasal congestion I prescribed  astelin nasal steroid. For your associated cough, I prescribed cough medicine hycodan.  Astelin in place of nasal steroid since you report adverse effect from flonase.(Can continue zyrtec for your allergies)  Rest, hydrate, tylenol for fever.  Follow up in 7 days or as needed.

## 2017-02-24 NOTE — Progress Notes (Signed)
Subjective:    Patient ID: Tara Rosales, female    DOB: Aug 04, 1981, 36 y.o.   MRN: [id number]  HPI   Pt in with 2-3 weeks of nasal congestion, frontal ha, sinus pressure and pain. Pt has colored mucous when she blows her nose. Left side nostril feels blocked When tried netty pot could not get water to pass. Some upper teeth discomfort.  Pt gets sinus infections. 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  Pt use to be on flonase. But she states made her nose bleed.   LMP- 3-17-208.  Review of Systems  Constitutional: Negative for chills, fatigue and fever.  HENT: Positive for congestion, postnasal drip, sinus pain and sinus pressure. Negative for ear pain, sneezing and trouble swallowing.   Respiratory: Positive for cough. Negative for chest tightness, shortness of breath and wheezing.   Cardiovascular: Negative for chest pain and palpitations.  Gastrointestinal: Negative for abdominal pain, blood in stool and constipation.  Musculoskeletal: Negative for back pain, myalgias and neck pain.  Skin: Negative for rash.  Neurological: Negative for dizziness and headaches.  Hematological: Negative for adenopathy. Does not bruise/bleed easily.  Psychiatric/Behavioral: Negative for behavioral problems and confusion.   Past Medical History:  Diagnosis Date  . Abnormal Pap smear of cervix    2004 & 2009  . Allergy   . History of chicken pox   . Hypothyroidism   . Hypothyroidism 09/19/2016     Social History   Social History  . Marital status: Single    Spouse name: N/A  . Number of children: N/A  . Years of education: N/A   Occupational History  . Not on file.   Social History Main Topics  . Smoking status: Never Smoker  . Smokeless tobacco: Never Used  . Alcohol use No  . Drug use: No  . Sexual activity: Not Currently    Partners: Male    Birth control/ protection: Abstinence   Other Topics Concern  . Not on file    Social History Narrative  . No narrative on file    Past Surgical History:  Procedure Laterality Date  . ADENOIDECTOMY    . BREAST REDUCTION SURGERY    . CERVICAL BIOPSY  W/ LOOP ELECTRODE EXCISION  2004  . COLPOSCOPY     2004 & 2009  . LYMPH NODE DISSECTION    . TONSILLECTOMY    . WISDOM TOOTH EXTRACTION  1995    Family History  Problem Relation Age of Onset  . Hypertension Mother   . Hypertension Father   . Cancer Father     prostate  . Cancer Maternal Grandmother     rectal  . Breast cancer Paternal Grandmother 26  . Heart attack Paternal Grandfather   . Diabetes Maternal Grandfather     Allergies  Allergen Reactions  . Penicillins Hives  . Sulfur Hives    Current Outpatient Prescriptions on File Prior to Visit  Medication Sig Dispense Refill  . B COMPLEX VITAMINS SL Place 1 mL under the tongue daily.    . Biotin 1000 MCG tablet Take 1,000 mcg by mouth daily.     . Calcium Carb-Cholecalciferol (CALCIUM 1000 + D) 1000-800 MG-UNIT TABS Take 4 capsules by mouth daily.    . cetirizine (ZYRTEC) 10 MG tablet Take 10 mg by mouth as needed for allergies.    Marland Kitchen levothyroxine (SYNTHROID) 25 MCG tablet Take 1.5 tablets (37.5 mcg total) by mouth daily before breakfast. 45 tablet 5  .  Multiple Vitamin (MULTIVITAMIN) tablet Take 1 tablet by mouth daily.     No current facility-administered medications on file prior to visit.     BP 109/76 (BP Location: Left Arm, Patient Position: Sitting, Cuff Size: Large)   Pulse 78   Temp 98.2 F (36.8 C) (Oral)   Resp 16   Ht 5\' 7"  (1.702 m)   Wt 171 lb 8 oz (77.8 kg)   LMP 02/18/2017   SpO2 100%   BMI 26.86 kg/m       Objective:   Physical Exam  General  Mental Status - Alert. General Appearance - Well groomed. Not in acute distress.  Skin Rashes- No Rashes.  HEENT Head- Normal. Ear Auditory Canal - Left- Normal. Right - Normal.Tympanic Membrane- Left- Normal. Right- moderate dull red. Eye Sclera/Conjunctiva-  Left- Normal. Right- Normal. Nose & Sinuses Nasal Mucosa- Left-  Boggy and Congested. Right-  Boggy and  Congested.Bilateral maxillary and  frontal sinus pressure. Mouth & Throat Lips: Upper Lip- Normal: no dryness, cracking, pallor, cyanosis, or vesicular eruption. Lower Lip-Normal: no dryness, cracking, pallor, cyanosis or vesicular eruption. Buccal Mucosa- Bilateral- No Aphthous ulcers. Oropharynx- No Discharge or Erythema. Tonsils: Characteristics- Bilateral- No Erythema or Congestion. Size/Enlargement- Bilateral- No enlargement. Discharge- bilateral-None.  Neck Neck- Supple. No Masses.   Chest and Lung Exam Auscultation: Breath Sounds:-Clear even and unlabored.  Cardiovascular Auscultation:Rythm- Regular, rate and rhythm. Murmurs & Other Heart Sounds:Ausculatation of the heart reveal- No Murmurs.  Lymphatic Head & Neck General Head & Neck Lymphatics: Bilateral: Description- No Localized lymphadenopathy.       Assessment & Plan:  You appear to have a sinus infection with ear infection. I am prescribing azithromycin antibiotic for the infection. To help with the nasal congestion I prescribed  astelin nasal steroid. For your associated cough, I prescribed cough medicine hycodan.  Astelin in place of nasal steroid since you report adverse effect from flonase.(Can continue zyrtec for your allergies)  Rest, hydrate, tylenol for fever.  Follow up in 7 days or as needed.

## 2017-02-24 NOTE — Progress Notes (Signed)
Pre visit review using our clinic review tool, if applicable. No additional management support is needed unless otherwise documented below in the visit note/SLS  

## 2017-03-20 MED FILL — SYNTHROID 25 MCG TABLET: 25 | 30 days supply | Qty: 45 | Fill #2

## 2017-04-19 MED FILL — SYNTHROID 25 MCG TABLET: 25 | 30 days supply | Qty: 45 | Fill #3

## 2017-05-17 MED FILL — SYNTHROID 25 MCG TABLET: 25 | 30 days supply | Qty: 45 | Fill #4

## 2017-06-19 ENCOUNTER — Ambulatory Visit (INDEPENDENT_AMBULATORY_CARE_PROVIDER_SITE_OTHER): Payer: PRIVATE HEALTH INSURANCE | Admitting: Family Medicine

## 2017-06-19 ENCOUNTER — Encounter: Payer: Self-pay | Admitting: Family Medicine

## 2017-06-19 VITALS — BP 90/70 | HR 70 | Temp 98.9°F | Ht 67.0 in | Wt 184.2 lb

## 2017-06-19 DIAGNOSIS — E039 Hypothyroidism, unspecified: Secondary | ICD-10-CM | POA: Diagnosis not present

## 2017-06-19 DIAGNOSIS — E559 Vitamin D deficiency, unspecified: Secondary | ICD-10-CM

## 2017-06-19 LAB — T4, FREE: Free T4: 0.9 ng/dL (ref 0.60–1.60)

## 2017-06-19 LAB — TSH: TSH: 1.43 u[IU]/mL (ref 0.35–4.50)

## 2017-06-19 LAB — VITAMIN D 25 HYDROXY (VIT D DEFICIENCY, FRACTURES): VITD: 28.96 ng/mL — AB (ref 30.00–100.00)

## 2017-06-19 MED FILL — SYNTHROID 25 MCG TABLET: 25 | 30 days supply | Qty: 45 | Fill #5

## 2017-06-19 NOTE — Progress Notes (Signed)
Chief Complaint  Patient presents with  . Follow-up    6 mos recheck thyroid    Subjective: Hypothyroidism Patient presents for follow-up of hypothyroidism.  Reports compliance with medication- levothyroxine 37.5 mcg daily. Current symptoms include: fatigue, weight gain, cold interolance, and feeling slow Denies: constipation, swelling, losing hair, tremulousness, palpitations and sweating She believes her dose should be increased based on her symptoms.  ROS: Heart: Denies chest pain  Lungs: Denies SOB   Family History  Problem Relation Age of Onset  . Hypertension Mother   . Hypertension Father   . Cancer Father        prostate  . Cancer Maternal Grandmother        rectal  . Breast cancer Paternal Grandmother 2065  . Heart attack Paternal Grandfather   . Diabetes Maternal Grandfather    Past Medical History:  Diagnosis Date  . Abnormal Pap smear of cervix    2004 & 2009  . Allergy   . History of chicken pox   . Hypothyroidism   . Hypothyroidism 09/19/2016   Allergies  Allergen Reactions  . Penicillins Hives  . Sulfur Hives    Current Outpatient Prescriptions:  .  B COMPLEX VITAMINS SL, Place 1 mL under the tongue daily., Disp: , Rfl:  .  Biotin 1000 MCG tablet, Take 1,000 mcg by mouth daily. , Disp: , Rfl:  .  Calcium Carb-Cholecalciferol (CALCIUM 1000 + D) 1000-800 MG-UNIT TABS, Take 4 capsules by mouth daily., Disp: , Rfl:  .  cetirizine (ZYRTEC) 10 MG tablet, Take 10 mg by mouth as needed for allergies., Disp: , Rfl:  .  levothyroxine (SYNTHROID) 25 MCG tablet, Take 1.5 tablets (37.5 mcg total) by mouth daily before breakfast., Disp: 45 tablet, Rfl: 5 .  Multiple Vitamin (MULTIVITAMIN) tablet, Take 1 tablet by mouth daily., Disp: , Rfl:   Objective: BP 90/70 (BP Location: Left Arm, Patient Position: Sitting, Cuff Size: Normal)   Pulse 70   Temp 98.9 F (37.2 C) (Oral)   Ht 5\' 7"  (1.702 m)   Wt 184 lb 3.2 oz (83.6 kg)   LMP 06/05/2017 (Exact Date)   SpO2  98%   BMI 28.85 kg/m  General: Awake, appears stated age HEENT: MMM, EOMi Neck: Soft, supple, no swelling or asymmetry, no thyromegaly Heart: RRR, no murmurs, no LE edema Lungs: CTAB, no rales, wheezes or rhonchi. No accessory muscle use Abd: BS+, soft, NT, ND, no masses or organomegaly Neuro: 2/4 patellar reflex, 1/4 biceps and calcaneal reflex b/l, no clonus Psych: Age appropriate judgment and insight, normal affect and mood  Assessment and Plan: Hypothyroidism, unspecified type - Plan: TSH, T4, free  Vitamin D deficiency - Plan: Vitamin D (25 hydroxy)  Orders as above. Pt states this is how she felt before she started taking thyroid supplementation. I might increase her to 50 mcg daily to see how she feels. Cont 37.5 mcg daily until labs come back.  We also discussed nutrition and intermittent fasting. F/u in 6 weeks. The patient voiced understanding and agreement to the plan.  Greater than 15 minutes were spent face to face with the patient with greater than 50% of this time spent counseling on thyroid disease, our plan after labs come back, diet-specifically intermittent fasting.    Tara Rosales Paul ElmwoodWendling, DO 06/19/17  1:48 PM

## 2017-06-19 NOTE — Patient Instructions (Signed)
I will let you know if we are going to change the dose on MyChart.

## 2017-06-20 ENCOUNTER — Other Ambulatory Visit: Payer: Self-pay | Admitting: Family Medicine

## 2017-06-20 MED ORDER — LEVOTHYROXINE SODIUM 50 MCG PO TABS: 50.0000 ug | ORAL_TABLET | Freq: Every day | ORAL | 2 refills | Status: DC

## 2017-06-20 NOTE — Progress Notes (Unsigned)
Let pt know the new, higher levothyroxine dose was called in the [facility] pharmacy. TY.  

## 2017-06-21 ENCOUNTER — Telehealth: Payer: Self-pay | Admitting: *Deleted

## 2017-06-21 NOTE — Telephone Encounter (Signed)
Called and spoke with the pt and informed her of the message below.  Pt verbalized understanding.  Pt stated that she will call the pharmacy and have them hold the prescription.  She stated that she is taking 2 of the old thyroid medication because she did not want to waste the medication.//AB/CMA

## 2017-06-21 NOTE — Telephone Encounter (Signed)
Let pt know the new, higher levothyroxine dose was called in the [facility]. TY.

## 2017-07-12 MED FILL — SYNTHROID 50 MCG TABLET: 50 | 30 days supply | Qty: 30 | Fill #0

## 2017-07-31 ENCOUNTER — Ambulatory Visit: Payer: PRIVATE HEALTH INSURANCE | Admitting: Family Medicine

## 2017-08-03 ENCOUNTER — Encounter: Payer: Self-pay | Admitting: Physician Assistant

## 2017-08-03 ENCOUNTER — Emergency Department (INDEPENDENT_AMBULATORY_CARE_PROVIDER_SITE_OTHER)
Admission: EM | Admit: 2017-08-03 | Discharge: 2017-08-03 | Disposition: A | Discharge status: Home / Self Care | Payer: PRIVATE HEALTH INSURANCE | Source: Home / Self Care

## 2017-08-03 DIAGNOSIS — M62830 Muscle spasm of back: Secondary | ICD-10-CM

## 2017-08-03 DIAGNOSIS — T148XXA Other injury of unspecified body region, initial encounter: Secondary | ICD-10-CM | POA: Diagnosis not present

## 2017-08-03 MED ORDER — CYCLOBENZAPRINE HCL 10 MG PO TABS: 10.0000 mg | ORAL_TABLET | Freq: Two times a day (BID) | ORAL | 0 refills | Status: DC | PRN

## 2017-08-03 MED ORDER — MELOXICAM 7.5 MG PO TABS: 7.5000 mg | ORAL_TABLET | Freq: Every day | ORAL | 0 refills | Status: AC

## 2017-08-03 MED ORDER — KETOROLAC TROMETHAMINE 30 MG/ML IJ SOLN: 30.0000 mg | Freq: Once | INTRAMUSCULAR | Status: DC

## 2017-08-03 MED FILL — CYCLOBENZAPRINE 10 MG TABLE: 10 | 20 days supply | Qty: 20 | Fill #0

## 2017-08-03 MED FILL — MELOXICAM 7.5 MG TABLET: 7.5 | 15 days supply | Qty: 15 | Fill #0

## 2017-08-03 NOTE — Discharge Instructions (Signed)
Your exam was most consistent with muscle strain and spasm. Start Mobic as directed for 10-15 days. You can take Tylenol with the Mobic if pain is not well controlled. Flexeril as needed at night. Flexeril can make you drowsy, so do not take if you are going to drive, operate heavy machinery, or make important decisions. Ice/heat compresses as needed. This can take up to 3-4 weeks to completely resolve, but you should be feeling better each week. Follow up here or with PCP if symptoms worsen, changes for reevaluation. If experience numbness/tingling of the inner thighs, loss of bladder or bowel control, go to the emergency department for evaluation.

## 2017-08-03 NOTE — ED Provider Notes (Signed)
[facility]    CSN: [id number] Arrival date & time: 08/03/17  0945     History   Chief Complaint Chief Complaint  Patient presents with  . Back Pain    HPI Tara Rosales is a 36 y.o. female.   36 year old female with history of hypothyroidism, herniated disc, comes in for 2 day history of back pain and spasm. Patient states she was bending over when pain first started. Pain worse on left side, although is bilateral. Pain is constant, and exacerbated by movement. She has tried over-the-counter pain medication, some pass, ice/heat compresses without good relief. She is also wearing a back brace to help prevent exacerbation of pain. She works as a Interior and spatial designer, and was unable to continue working due to requirements of bending over during shampooing. Patient states When experiencing muscle spasms, experiences numbness and tingling as well as pain radiating to her legs. Denies numbness and tingling of the inner thighs, loss of bladder or bowel control. She states when she was in her 48s, she had a MRI showing she had 2 bulging disks, however, symptoms have since resolved, and has not been bothering her until this event.      Past Medical History:  Diagnosis Date  . Abnormal Pap smear of cervix    2004 & 2009  . Allergy   . History of chicken pox   . Hypothyroidism   . Hypothyroidism 09/19/2016    Patient Active Problem List   Diagnosis Date Noted  . Hypothyroidism 09/19/2016  . Vitamin D deficiency 03/26/2015  . Abnormal Pap smear of cervix 07/24/2014    Class: History of  . Asthma 05/07/2011  . FOOT PAIN 05/07/2011    Past Surgical History:  Procedure Laterality Date  . ADENOIDECTOMY    . BREAST REDUCTION SURGERY    . CERVICAL BIOPSY  W/ LOOP ELECTRODE EXCISION  2004  . COLPOSCOPY     2004 & 2009  . LYMPH NODE DISSECTION    . TONSILLECTOMY    . WISDOM TOOTH EXTRACTION  1995    OB History    Gravida Para Term Preterm AB Living   0 0 0 0 0 0   SAB  TAB Ectopic Multiple Live Births   0 0 0 0         Home Medications    Prior to Admission medications   Medication Sig Start Date End Date Taking? Authorizing Provider  B COMPLEX VITAMINS SL Place 1 mL under the tongue daily.    [provider]  Biotin 1000 MCG tablet Take 1,000 mcg by mouth daily.     [provider]  Calcium Carb-Cholecalciferol (CALCIUM 1000 + D) 1000-800 MG-UNIT TABS Take 4 capsules by mouth daily.    [provider]  cetirizine (ZYRTEC) 10 MG tablet Take 10 mg by mouth as needed for allergies.    [provider]  cyclobenzaprine (FLEXERIL) 10 MG tablet Take 1 tablet (10 mg total) by mouth 2 (two) times daily as needed for muscle spasms. 08/03/17   Cathie Hoops, Lenzy Kerschner V, PA-C  levothyroxine (SYNTHROID, LEVOTHROID) 50 MCG tablet Take 1 tablet (50 mcg total) by mouth daily. 06/20/17   Sharlene Dory, DO  meloxicam (MOBIC) 7.5 MG tablet Take 1 tablet (7.5 mg total) by mouth daily. 08/03/17 08/18/17  Belinda Fisher, PA-C  Multiple Vitamin (MULTIVITAMIN) tablet Take 1 tablet by mouth daily.    [provider]    Family History Family History  Problem Relation Age of Onset  .  Hypertension Mother   . Hypertension Father   . Cancer Father        prostate  . Cancer Maternal Grandmother        rectal  . Breast cancer Paternal Grandmother 53  . Heart attack Paternal Grandfather   . Diabetes Maternal Grandfather     Social History Social History  Substance Use Topics  . Smoking status: Never Smoker  . Smokeless tobacco: Never Used  . Alcohol use No     Allergies   Penicillins and Sulfur   Review of Systems Review of Systems  Reason unable to perform ROS: See HPI as above.     Physical Exam Triage Vital Signs ED Triage Vitals  Enc Vitals Group     BP 08/03/17 1003 117/81     Pulse Rate 08/03/17 1003 75     Resp --      Temp 08/03/17 1003 98.4 F (36.9 C)     Temp Source 08/03/17 1003 Oral     SpO2 08/03/17 1003 98  %     Weight 08/03/17 1004 193 lb (87.5 kg)     Height 08/03/17 1004 5\' 8"  (1.727 m)     Head Circumference --      Peak Flow --      Pain Score 08/03/17 1004 4     Pain Loc --      Pain Edu? --      Excl. in GC? --    No data found.   Updated Vital Signs BP 117/81 (BP Location: Left Arm)   Pulse 75   Temp 98.4 F (36.9 C) (Oral)   Ht 5\' 8"  (1.727 m)   Wt 193 lb (87.5 kg)   LMP 07/24/2017   SpO2 98%   BMI 29.35 kg/m   Visual Acuity Right Eye Distance:   Left Eye Distance:   Bilateral Distance:    Right Eye Near:   Left Eye Near:    Bilateral Near:     Physical Exam  Constitutional: She is oriented to person, place, and time. She appears well-developed and well-nourished. No distress.  HENT:  Head: Normocephalic and atraumatic.  Eyes: Pupils are equal, round, and reactive to light. Conjunctivae are normal.  Cardiovascular: Normal rate, regular rhythm and normal heart sounds.  Exam reveals no gallop and no friction rub.   No murmur heard. Pulmonary/Chest: Effort normal and breath sounds normal. She has no wheezes. She has no rales.  Musculoskeletal:  Tenderness on palpation of the right and left paraspinal muscles around thoracic to lumbar region, left greater than right. Decreased range of motion of the back due to pain. Full range of motion of hips, though unable to test strength given discomfort when laying down. Sensation intact and equal bilaterally.   Neurological: She is alert and oriented to person, place, and time.  Skin: Skin is warm and dry.     UC Treatments / Results  Labs (all labs ordered are listed, but only abnormal results are displayed) Labs Reviewed - No data to display  EKG  EKG Interpretation None       Radiology No results found.  Procedures Procedures (including critical care time)  Medications Ordered in UC Medications  ketorolac (TORADOL) 30 MG/ML injection 30 mg (not administered)     Initial Impression / Assessment  and Plan / UC Course  I have reviewed the triage vital signs and the nursing notes.  Pertinent labs & imaging results that were available during my care of the  patient were reviewed by me and considered in my medical decision making (see chart for details).    Discussed with patient history and exam most consistent with muscle strain/spasm. Toradol injection in office. Start NSAID as directed for pain and inflammation. Muscle relaxant as needed. Ice/heat compresses. Discussed with patient strain can take up to 3-4 weeks to resolve, but should be getting better each week. Patient to follow-up with PCP if symptoms not improving for reevaluation and further treatment needed. Return precautions given. Patient expresses understanding and agrees to plan.   Final Clinical Impressions(s) / UC Diagnoses   Final diagnoses:  Muscle strain  Muscle spasm of back    New Prescriptions New Prescriptions   CYCLOBENZAPRINE (FLEXERIL) 10 MG TABLET    Take 1 tablet (10 mg total) by mouth 2 (two) times daily as needed for muscle spasms.   MELOXICAM (MOBIC) 7.5 MG TABLET    Take 1 tablet (7.5 mg total) by mouth daily.      Belinda FisherYu, Channing Yeager V, PA-C 08/03/17 1022

## 2017-08-03 NOTE — ED Triage Notes (Signed)
Lower left back pain after bending over to pick something up off the floor 2 days ago. History of 2 bulging discs, pain: 4-10, spasms

## 2017-08-09 MED FILL — SYNTHROID 50 MCG TABLET: 50 | 30 days supply | Qty: 30 | Fill #1

## 2017-08-31 ENCOUNTER — Ambulatory Visit: Payer: 59 | Admitting: Certified Nurse Midwife

## 2017-09-06 MED FILL — SYNTHROID 50 MCG TABLET: 50 | 30 days supply | Qty: 30 | Fill #2

## 2017-09-13 ENCOUNTER — Ambulatory Visit (INDEPENDENT_AMBULATORY_CARE_PROVIDER_SITE_OTHER): Payer: 59 | Admitting: Certified Nurse Midwife

## 2017-09-13 ENCOUNTER — Other Ambulatory Visit (HOSPITAL_COMMUNITY)
Admission: RE | Admit: 2017-09-13 | Discharge: 2017-09-13 | Disposition: A | Discharge status: Home / Self Care | Payer: PRIVATE HEALTH INSURANCE | Source: Ambulatory Visit | Attending: Certified Nurse Midwife | Admitting: Certified Nurse Midwife

## 2017-09-13 ENCOUNTER — Encounter: Payer: Self-pay | Admitting: Certified Nurse Midwife

## 2017-09-13 VITALS — BP 118/70 | HR 64 | Resp 16 | Ht 67.75 in | Wt 189.0 lb

## 2017-09-13 DIAGNOSIS — Z124 Encounter for screening for malignant neoplasm of cervix: Secondary | ICD-10-CM | POA: Diagnosis not present

## 2017-09-13 DIAGNOSIS — Z01419 Encounter for gynecological examination (general) (routine) without abnormal findings: Secondary | ICD-10-CM | POA: Diagnosis not present

## 2017-09-13 DIAGNOSIS — Z9889 Other specified postprocedural states: Secondary | ICD-10-CM

## 2017-09-13 NOTE — Progress Notes (Signed)
36 y.o. G0P0000 Single  Caucasian Fe here for annual exam. Periods more normal this year with being regular. 3 day duration heavy first day only. Cramping has increased some with soreness noted after period for a day only. Patient takes OTC Midol with good results. Condoms working well for contraception. Hypothyroid still not stable, and management with PCP, but has now scheduled with Endocrine for evaluation. Sees PCP for aex and labs. No other health issues today.   Patient's last menstrual period was 08/28/2017 (exact date).          Sexually active: Yes.    The current method of family planning is condoms all the time.    Exercising: Yes.    walking Smoker:  no  Health Maintenance: Pap:  08-30-16 neg History of Abnormal Pap: yes LEEP (2004) MMG:  none Self Breast exams: yes Colonoscopy:  none BMD:   none TDaP:  2017 Shingles: no Pneumonia: no Hep C and HIV: had testing done yrs ago Labs: no   reports that she has never smoked. She has never used smokeless tobacco. She reports that she does not use drugs.  Past Medical History:  Diagnosis Date  . Abnormal Pap smear of cervix    2004 & 2009  . Allergy   . History of chicken pox   . Hypothyroidism   . Hypothyroidism 09/19/2016    Past Surgical History:  Procedure Laterality Date  . ADENOIDECTOMY    . BREAST REDUCTION SURGERY    . CERVICAL BIOPSY  W/ LOOP ELECTRODE EXCISION  2004  . COLPOSCOPY     2004 & 2009  . LYMPH NODE DISSECTION    . TONSILLECTOMY    . WISDOM TOOTH EXTRACTION  1995    Current Outpatient Prescriptions  Medication Sig Dispense Refill  . B COMPLEX VITAMINS SL Place 1 mL under the tongue daily.    . Calcium Carb-Cholecalciferol (CALCIUM 1000 + D) 1000-800 MG-UNIT TABS Take 4 capsules by mouth daily.    . cetirizine (ZYRTEC) 10 MG tablet Take 10 mg by mouth as needed for allergies.    . Cholecalciferol (VITAMIN D PO) Take by mouth daily.    Marland Kitchen levothyroxine (SYNTHROID, LEVOTHROID) 50 MCG tablet Take 1  tablet (50 mcg total) by mouth daily. 30 tablet 2  . Multiple Vitamin (MULTIVITAMIN) tablet Take 1 tablet by mouth daily.     No current facility-administered medications for this visit.     Family History  Problem Relation Age of Onset  . Hypertension Mother   . Hypertension Father   . Cancer Father        prostate  . Cancer Maternal Grandmother        rectal  . Breast cancer Paternal Grandmother 15  . Heart attack Paternal Grandfather   . Diabetes Maternal Grandfather     ROS:  Pertinent items are noted in HPI.  Otherwise, a comprehensive ROS was negative.  Exam:   BP 118/70   Pulse 64   Resp 16   Ht 5' 7.75" (1.721 m)   Wt 189 lb (85.7 kg)   LMP 08/28/2017 (Exact Date)   BMI 28.95 kg/m  Height: 5' 7.75" (172.1 cm) Ht Readings from Last 3 Encounters:  09/13/17 5' 7.75" (1.721 m)  08/03/17  (1.727 m)  06/19/17  (1.702 m)    General appearance: alert, cooperative and appears stated age Head: Normocephalic, without obvious abnormality, atraumatic Neck: no adenopathy, supple, symmetrical, trachea midline and thyroid normal to inspection and palpation Lungs:  clear to auscultation bilaterally Breasts: normal appearance, no masses or tenderness, No nipple retraction or dimpling, No nipple discharge or bleeding, No axillary or supraclavicular adenopathy Heart: regular rate and rhythm Abdomen: soft, non-tender; no masses,  no organomegaly Extremities: extremities normal, atraumatic, no cyanosis or edema Skin: Skin color, texture, turgor normal. No rashes or lesions Lymph nodes: Cervical, supraclavicular, and axillary nodes normal. No abnormal inguinal nodes palpated Neurologic: Grossly normal   Pelvic: External genitalia:  no lesions              Urethra:  normal appearing urethra with no masses, tenderness or lesions              Bartholin's and Skene's: normal                 Vagina: normal appearing vagina with normal color and discharge, no lesions               Cervix: no cervical motion tenderness, no lesions and LEEP appearance              Pap taken: Yes.   Bimanual Exam:  Uterus:  normal size, contour, position, consistency, mobility, non-tender              Adnexa: normal adnexa and no mass, fullness, tenderness               Rectovaginal: Confirms               Anus:  normal sphincter tone, no lesions  Chaperone present: yes  A:  Well Woman with normal exam  Contraception condoms  Slight period change with cramping , but no issues   History of abnormal pap smear with LEEP 2004  Hypothyroid not stable per patient, has scheduled to se Endocrine  Family history of breast cancer PGM age 50  P:   Reviewed health and wellness pertinent to exam  Patient will advise if periods are not normal,m parameters given. Discussed thyroid can affect cycles also.  Patient request pap smear yearly due to history  Continue follow up for Hypothyroid  Pap smear: yes   counseled on breast self exam, STD prevention, HIV risk factors and prevention, adequate intake of calcium and vitamin D, diet and exercise  return annually or prn  An After Visit Summary was printed and given to the patient.

## 2017-09-13 NOTE — Patient Instructions (Signed)

## 2017-09-14 LAB — CYTOLOGY - PAP
Diagnosis: NEGATIVE
HPV (WINDOPATH): NOT DETECTED

## 2017-10-03 ENCOUNTER — Other Ambulatory Visit: Payer: Self-pay | Admitting: Family Medicine

## 2017-10-03 MED FILL — SYNTHROID 50 MCG TABLET: 50 | 30 days supply | Qty: 30 | Fill #0

## 2017-11-08 MED FILL — SYNTHROID 50 MCG TABLET: 50 | 30 days supply | Qty: 30 | Fill #1

## 2017-12-04 MED FILL — SYNTHROID 50 MCG TABLET: 50 | 30 days supply | Qty: 30 | Fill #2

## 2018-01-01 MED FILL — SYNTHROID 50 MCG TABLET: 50 | 30 days supply | Qty: 30 | Fill #0

## 2018-01-03 ENCOUNTER — Ambulatory Visit: Payer: Self-pay | Admitting: Emergency Medicine

## 2018-01-03 VITALS — BP 115/80 | HR 82 | Temp 98.6°F | Resp 16 | Wt 174.6 lb

## 2018-01-03 DIAGNOSIS — J069 Acute upper respiratory infection, unspecified: Secondary | ICD-10-CM

## 2018-01-03 MED ORDER — FLUCONAZOLE 200 MG PO TABS: ORAL_TABLET | ORAL | 0 refills | Status: DC

## 2018-01-03 MED ORDER — DOXYCYCLINE HYCLATE 100 MG PO TABS: 100.0000 mg | ORAL_TABLET | Freq: Two times a day (BID) | ORAL | 0 refills | Status: DC

## 2018-01-03 MED ORDER — LORATADINE-PSEUDOEPHEDRINE ER 10-240 MG PO TB24: 1.0000 | ORAL_TABLET | Freq: Every day | ORAL | 0 refills | Status: DC

## 2018-01-03 MED ORDER — FLUTICASONE PROPIONATE 50 MCG/ACT NA SUSP: 2.0000 | Freq: Two times a day (BID) | NASAL | 0 refills | Status: DC

## 2018-01-03 MED FILL — FLUCONAZOLE 200 MG TABLET: 200 | 2 days supply | Qty: 2 | Fill #0

## 2018-01-03 MED FILL — ALLERGY RELIEF-NASAL DECONG: 10-240 | 30 days supply | Qty: 30 | Fill #0

## 2018-01-03 MED FILL — DOXYCYCLINE HYCLATE 100 MG: 100 | 10 days supply | Qty: 20 | Fill #0

## 2018-01-03 NOTE — Patient Instructions (Signed)

## 2018-01-03 NOTE — Progress Notes (Signed)
Subjective:     Tara Rosales is a 37 y.o. female who presents for evaluation of symptoms of a URI. Symptoms include achiness, congestion, non productive cough, sneezing and tooth pain. Onset of symptoms was 3 days ago, and has been unchanged since that time. Treatment to date: none.  The following portions of the patient's history were reviewed and updated as appropriate: allergies and current medications.  Review of Systems Pertinent items noted in HPI and remainder of comprehensive ROS otherwise negative.   Objective:    BP 115/80 (BP Location: Right Arm, Patient Position: Sitting, Cuff Size: Normal)   Pulse 82   Temp 98.6 F (37 C) (Oral)   Resp 16   Wt 174 lb 9.6 oz (79.2 kg)   SpO2 98%   BMI 26.74 kg/m  General appearance: alert, cooperative and appears stated age Head: Normocephalic, without obvious abnormality, atraumatic Eyes: negative Ears: normal TM's and external ear canals both ears Nose: Nares normal. Septum midline. Mucosa normal. No drainage or sinus tenderness. Throat: lips, mucosa, and tongue normal; teeth and gums normal Neck: no adenopathy Lungs: clear to auscultation bilaterally Heart: regular rate and rhythm Pulses: 2+ and symmetric   Assessment:    viral upper respiratory illness   Plan:    Discussed diagnosis and treatment of URI. Discussed the importance of avoiding unnecessary antibiotic therapy. Suggested symptomatic OTC remedies. Nasal saline spray for congestion. Nasal steroids per orders. Follow up as needed. Given delayed fill Rx for doxycyline, recommend against filling for 4-5 days. Return as needed

## 2018-02-06 MED FILL — SYNTHROID 50 MCG TABLET: 50 | 30 days supply | Qty: 30 | Fill #1

## 2018-03-07 MED FILL — SYNTHROID 50 MCG TABLET: 50 | 30 days supply | Qty: 30 | Fill #2

## 2018-03-09 ENCOUNTER — Encounter: Payer: Self-pay | Admitting: Podiatry

## 2018-03-09 ENCOUNTER — Ambulatory Visit (INDEPENDENT_AMBULATORY_CARE_PROVIDER_SITE_OTHER): Payer: 59

## 2018-03-09 ENCOUNTER — Ambulatory Visit (INDEPENDENT_AMBULATORY_CARE_PROVIDER_SITE_OTHER): Payer: 59 | Admitting: Podiatry

## 2018-03-09 ENCOUNTER — Other Ambulatory Visit: Payer: Self-pay | Admitting: Podiatry

## 2018-03-09 VITALS — BP 101/73 | HR 77 | Resp 16

## 2018-03-09 DIAGNOSIS — M21962 Unspecified acquired deformity of left lower leg: Secondary | ICD-10-CM

## 2018-03-09 DIAGNOSIS — M778 Other enthesopathies, not elsewhere classified: Secondary | ICD-10-CM

## 2018-03-09 DIAGNOSIS — M779 Enthesopathy, unspecified: Principal | ICD-10-CM

## 2018-03-09 DIAGNOSIS — M722 Plantar fascial fibromatosis: Secondary | ICD-10-CM

## 2018-03-14 ENCOUNTER — Other Ambulatory Visit: Payer: 59 | Admitting: Orthotics

## 2018-04-02 MED FILL — SYNTHROID 50 MCG TABLET: 50 | 30 days supply | Qty: 30 | Fill #0

## 2018-04-02 NOTE — Progress Notes (Signed)
  Subjective:  Patient ID: Tara Rosales, female    DOB: 12-May-1981,  MRN: [id number]  Chief Complaint  Patient presents with  . Foot Pain    Plantar forefoot left - callused area x 3-4 years, more painful now with walking, hairdresser so on feet all day, tried trimming-no help   37 y.o. female presents with the above complaint.  Reports painful callused area to the left forefoot x3 to 4 years.  More painful with walking.  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 Abnormal Pap smear of cervix    2004 & 2009  . Allergy   . History of chicken pox   . Hypothyroidism   . Hypothyroidism 09/19/2016   Past Surgical History:  Procedure Laterality Date  . ADENOIDECTOMY    . BREAST REDUCTION SURGERY    . CERVICAL BIOPSY  W/ LOOP ELECTRODE EXCISION  2004  . COLPOSCOPY     2004 & 2009  . LYMPH NODE DISSECTION    . TONSILLECTOMY    . WISDOM TOOTH EXTRACTION  1995    Current Outpatient Medications:  .  B COMPLEX VITAMINS SL, Place 1 mL under the tongue daily., Disp: , Rfl:  .  Calcium Carb-Cholecalciferol (CALCIUM 1000 + D) 1000-800 MG-UNIT TABS, Take 4 capsules by mouth daily., Disp: , Rfl:  .  cetirizine (ZYRTEC) 10 MG tablet, Take 10 mg by mouth as needed for allergies., Disp: , Rfl:  .  Cholecalciferol (VITAMIN D PO), Take by mouth daily., Disp: , Rfl:  .  fluconazole (DIFLUCAN) 200 MG tablet, Take one tablet today, wait 3 days, take the second tablet, Disp: 2 tablet, Rfl: 0 .  fluticasone (FLONASE) 50 MCG/ACT nasal spray, Place 2 sprays into both nostrils 2 (two) times daily., Disp: 15.8 g, Rfl: 0 .  loratadine-pseudoephedrine (CLARITIN-D 24-HOUR) 10-240 MG 24 hr tablet, Take 1 tablet by mouth daily., Disp: 30 tablet, Rfl: 0 .  MAGNESIUM PO, Take by mouth., Disp: , Rfl:  .  Multiple Vitamin (MULTIVITAMIN) tablet, Take 1 tablet by mouth daily., Disp: , Rfl:  .  SYNTHROID 50 MCG tablet, TAKE 1 TABLET (50 MCG TOTAL) BY MOUTH DAILY.,  Disp: 30 tablet, Rfl: 2  Allergies  Allergen Reactions  . Penicillins Hives  . Sulfur Hives   Review of Systems: Negative except as noted in the HPI. Denies N/V/F/Ch. Objective:   Vitals:   03/09/18 0827  BP: 101/73  Pulse: 77  Resp: 16   General AA&O x3. Normal mood and affect.  Vascular Dorsalis pedis and posterior tibial pulses  present 2+ bilaterally  Capillary refill normal to all digits. Pedal hair growth normal.  Neurologic Epicritic sensation grossly present.  Dermatologic No open lesions. Interspaces clear of maceration. Nails well groomed and normal in appearance. Left submet 2 callus  Orthopedic: MMT 5/5 in dorsiflexion, plantarflexion, inversion, and eversion. Normal joint ROM without pain or crepitus.   Assessment & Plan:  Patient was evaluated and treated and all questions answered.  Capsulitis 2nd MPJ -X-rays taken and reviewed short first metatarsal -Would benefit from custom molded orthotics with second metatarsal cutout. -Educated on self-care  No follow-ups on file.

## 2018-04-03 ENCOUNTER — Ambulatory Visit (INDEPENDENT_AMBULATORY_CARE_PROVIDER_SITE_OTHER): Payer: 59 | Admitting: Orthotics

## 2018-04-03 DIAGNOSIS — M7750 Other enthesopathy of unspecified foot: Secondary | ICD-10-CM | POA: Diagnosis not present

## 2018-04-03 DIAGNOSIS — M779 Enthesopathy, unspecified: Principal | ICD-10-CM

## 2018-04-03 DIAGNOSIS — M778 Other enthesopathies, not elsewhere classified: Secondary | ICD-10-CM

## 2018-04-03 NOTE — Progress Notes (Signed)
Patient came in today to pick up custom made foot orthotics.  The goals were accomplished and the patient reported no dissatisfaction with said orthotics.  Patient was advised of breakin period and how to report any issues  She was a little concerned about fitting into all her shoes, I told her they can be redone with thinner materal than spenco.

## 2018-04-16 ENCOUNTER — Ambulatory Visit: Payer: 59 | Admitting: Orthotics

## 2018-04-16 DIAGNOSIS — M21962 Unspecified acquired deformity of left lower leg: Secondary | ICD-10-CM

## 2018-04-16 NOTE — Progress Notes (Signed)
Needs to adjust f/o to fit in [employer] work shoes; sulcus, vinyl cover, reduce arch, more shallow heel.

## 2018-04-18 MED FILL — NATURE-THROID 65 MG TABLET: 65 | 30 days supply | Qty: 30 | Fill #0

## 2018-04-20 ENCOUNTER — Ambulatory Visit: Payer: 59 | Admitting: Podiatry

## 2018-04-27 ENCOUNTER — Telehealth: Payer: Self-pay | Admitting: Podiatry

## 2018-04-27 NOTE — Telephone Encounter (Signed)
Called pt to cancel appt and no voicemail set up. I canceled appt and pt should get a message in my chart. Our office is closed on Monday.

## 2018-05-01 ENCOUNTER — Other Ambulatory Visit: Payer: 59 | Admitting: Orthotics

## 2018-05-01 NOTE — Telephone Encounter (Signed)
Pt came in for her appt and was very upset and stated we did not call her. I called at 349pm on Friday and no voicemail was set up. I canceled the appt so that it would show up in her my chart account. She said she will figure out when the hell she can come in when the orthotics come in. She would have not drove here if they were not here.  I explained to the patient that they were shipped Friday and they should arrive today. She said this has been a horrible experience.

## 2018-05-01 NOTE — Telephone Encounter (Signed)
Called pt because orthotics did arrive today and offered to meet her in [place] after 6pm when it is best for her if that was ok with her. She also stated she could possibly come Thursday at 11am and I told her what ever works best for her. She said that would be great and we are planning on meeting tomorrow after six in [place] but I am going to contact pt tomorrow to discuss location and set it up.  I did apologize again and told her I miss read the note I made from Friday and I was not able to leave a voicemail when I called.

## 2018-05-02 NOTE — Telephone Encounter (Signed)
Called pt and we are meeting at 615pm at the [facility] in [place] so I can give her the orthotics for her to try out.

## 2018-05-03 ENCOUNTER — Telehealth: Payer: Self-pay | Admitting: Podiatry

## 2018-05-03 NOTE — Telephone Encounter (Signed)
Delivered patients orthotics to her in [place]. Pt tried them in her [employer] shoes and was able to put her foot in this time. She will try them out and call if there are any problems.

## 2018-05-17 MED FILL — NATURE-THROID 65 MG TABLET: 65 | 30 days supply | Qty: 30 | Fill #1

## 2018-06-18 MED FILL — NATURE-THROID 65 MG TABLET: 65 | 30 days supply | Qty: 30 | Fill #2

## 2018-07-18 MED FILL — NATURE-THROID 65 MG TABLET: 65 | 30 days supply | Qty: 30 | Fill #3

## 2018-08-16 MED FILL — NATURE-THROID 65 MG TABLET: 65 | 30 days supply | Qty: 30 | Fill #0

## 2018-09-03 ENCOUNTER — Ambulatory Visit: Payer: Self-pay | Admitting: Adult Health

## 2018-09-03 ENCOUNTER — Encounter: Payer: Self-pay | Admitting: Adult Health

## 2018-09-03 VITALS — BP 100/75 | HR 85 | Temp 99.0°F | Resp 16 | Wt 188.4 lb

## 2018-09-03 DIAGNOSIS — J019 Acute sinusitis, unspecified: Secondary | ICD-10-CM

## 2018-09-03 DIAGNOSIS — H6983 Other specified disorders of Eustachian tube, bilateral: Secondary | ICD-10-CM

## 2018-09-03 MED ORDER — PREDNISONE 10 MG (21) PO TBPK: ORAL_TABLET | ORAL | 0 refills | Status: DC

## 2018-09-03 MED ORDER — DOXYCYCLINE HYCLATE 100 MG PO TABS: 100.0000 mg | ORAL_TABLET | Freq: Two times a day (BID) | ORAL | 0 refills | Status: DC

## 2018-09-03 MED FILL — DOXYCYCLINE HYCLATE 100 MG: 100 | 10 days supply | Qty: 20 | Fill #0

## 2018-09-03 MED FILL — predniSONE 10 MG TABS: 10 | 6 days supply | Qty: 21 | Fill #0

## 2018-09-03 NOTE — Patient Instructions (Signed)
Sinusitis, Adult Sinusitis is soreness and inflammation of your sinuses. Sinuses are hollow spaces in the bones around your face. They are located:  Around your eyes.  In the middle of your forehead.  Behind your nose.  In your cheekbones.  Your sinuses and nasal passages are lined with a stringy fluid (mucus). Mucus normally drains out of your sinuses. When your nasal tissues get inflamed or swollen, the mucus can get trapped or blocked so air cannot flow through your sinuses. This lets bacteria, viruses, and funguses grow, and that leads to infection. Follow these instructions at home: Medicines  Take, use, or apply over-the-counter and prescription medicines only as told by your doctor. These may include nasal sprays.  If you were prescribed an antibiotic medicine, take it as told by your doctor. Do not stop taking the antibiotic even if you start to feel better. Hydrate and Humidify  Drink enough water to keep your pee (urine) clear or pale yellow.  Use a cool mist humidifier to keep the humidity level in your home above 50%.  Breathe in steam for 10-15 minutes, 3-4 times a day or as told by your doctor. You can do this in the bathroom while a hot shower is running.  Try not to spend time in cool or dry air. Rest  Rest as much as possible.  Sleep with your head raised (elevated).  Make sure to get enough sleep each night. General instructions  Put a warm, moist washcloth on your face 3-4 times a day or as told by your doctor. This will help with discomfort.  Wash your hands often with soap and water. If there is no soap and water, use hand sanitizer.  Do not smoke. Avoid being around people who are smoking (secondhand smoke).  Keep all follow-up visits as told by your doctor. This is important. Contact a doctor if:  You have a fever.  Your symptoms get worse.  Your symptoms do not get better within 10 days. Get help right away if:  You have a very bad  headache.  You cannot stop throwing up (vomiting).  You have pain or swelling around your face or eyes.  You have trouble seeing.  You feel confused.  Your neck is stiff.  You have trouble breathing. This information is not intended to replace advice given to you by your health care provider. Make sure you discuss any questions you have with your health care provider. Document Released: 05/09/2008 Document Revised: 07/17/2016 Document Reviewed: 09/16/2015 Elsevier Interactive Patient Education  2018 [employer] Eustachian Tube Dysfunction The eustachian tube connects the middle ear to the back of the nose. It regulates air pressure in the middle ear by allowing air to move between the ear and nose. It also helps to drain fluid from the middle ear space. When the eustachian tube does not function properly, air pressure, fluid, or both can build up in the middle ear. Eustachian tube dysfunction can affect one or both ears. What are the causes? This condition happens when the eustachian tube becomes blocked or cannot open normally. This may result from:  Ear infections.  Colds and other upper respiratory infections.  Allergies.  Irritation, such as from cigarette smoke or acid from the stomach coming up into the esophagus (gastroesophageal reflux).  Sudden changes in air pressure, such as from descending in an airplane.  Abnormal growths in the nose or throat, such as nasal polyps, tumors, or enlarged tissue at the back of the throat (adenoids).    What increases the risk? This condition may be more likely to develop in people who smoke and people who are overweight. Eustachian tube dysfunction may also be more likely to develop in children, especially children who have:  Certain birth defects of the mouth, such as cleft palate.  Large tonsils and adenoids.  What are the signs or symptoms? Symptoms of this condition may include:  A feeling of fullness in the ear.  Ear  pain.  Clicking or popping noises in the ear.  Ringing in the ear.  Hearing loss.  Loss of balance.  Symptoms may get worse when the air pressure around you changes, such as when you travel to an area of high elevation or fly on an airplane. How is this diagnosed? This condition may be diagnosed based on:  Your symptoms.  A physical exam of your ear, nose, and throat.  Tests, such as those that measure: ? The movement of your eardrum (tympanogram). ? Your hearing (audiometry).  How is this treated? Treatment depends on the cause and severity of your condition. If your symptoms are mild, you may be able to relieve your symptoms by moving air into ("popping") your ears. If you have symptoms of fluid in your ears, treatment may include:  Decongestants.  Antihistamines.  Nasal sprays or ear drops that contain medicines that reduce swelling (steroids).  In some cases, you may need to have a procedure to drain the fluid in your eardrum (myringotomy). In this procedure, a small tube is placed in the eardrum to:  Drain the fluid.  Restore the air in the middle ear space.  Follow these instructions at home:  Take over-the-counter and prescription medicines only as told by your health care provider.  Use techniques to help pop your ears as recommended by your health care provider. These may include: ? Chewing gum. ? Yawning. ? Frequent, forceful swallowing. ? Closing your mouth, holding your nose closed, and gently blowing as if you are trying to blow air out of your nose.  Do not do any of the following until your health care provider approves: ? Travel to high altitudes. ? Fly in airplanes. ? Work in a pressurized cabin or room. ? Scuba dive.  Keep your ears dry. Dry your ears completely after showering or bathing.  Do not smoke.  Keep all follow-up visits as told by your health care provider. This is important. Contact a health care provider if:  Your symptoms do  not go away after treatment.  Your symptoms come back after treatment.  You are unable to pop your ears.  You have: ? A fever. ? Pain in your ear. ? Pain in your head or neck. ? Fluid draining from your ear.  Your hearing suddenly changes.  You become very dizzy.  You lose your balance. This information is not intended to replace advice given to you by your health care provider. Make sure you discuss any questions you have with your health care provider. Document Released: 12/18/2015 Document Revised: 04/28/2016 Document Reviewed: 12/10/2014 Elsevier Interactive Patient Education  2018 [employer] Inc[employer]  

## 2018-09-03 NOTE — Progress Notes (Signed)
Subjective:     Patient ID: Tara Rosales, female   DOB: 1981-08-12, 37 y.o.   MRN: [id number]   Blood pressure 100/75, pulse 85, temperature 99 F (37.2 C), temperature source Oral, resp. rate 16, weight 188 lb 6.4 oz (85.5 kg), SpO2 99 %.  Sinusitis  This is a new problem. The current episode started 1 to 4 weeks ago (2 1/2 weeks started with nasal allergies ). The problem has been gradually worsening since onset. Maximum temperature: 99.9 she reports last two days  The fever has been present for 1 to 2 days. The pain is mild. Associated symptoms include congestion, coughing (intermittent ), ear pain, sinus pressure, a sore throat and swollen glands (throat ). Pertinent negatives include no chills, diaphoresis, headaches, hoarse voice, neck pain, shortness of breath or sneezing. Past treatments include oral decongestants. The treatment provided mild relief.     Blood pressure 100/75, pulse 85, temperature 99 F (37.2 C), temperature source Oral, resp. rate 16, weight 188 lb 6.4 oz (85.5 kg), SpO2 99 %. LMP: 08/14/18- denies any chance of pregnancy - not sexually active per patients report.  Patient is a 37 year old female in no acute distress with pain in ears, sinusitis. She reports she is allergic to  Allergies  Allergen Reactions  . Penicillins Hives  . Sulfur Hives   She reports she does not respond well to Azithromycin. Denies any allergy to the medication.  She has tried [place] and Dayquil. She reports she started to have fatigue with this illness the past two days. Pressure in ears bilaterally.       Review of Systems  Constitutional: Negative for chills and diaphoresis.  HENT: Positive for congestion, ear pain, postnasal drip, sinus pressure and sore throat. Negative for dental problem, drooling, ear discharge, facial swelling, hearing loss, hoarse voice, mouth sores, nosebleeds, rhinorrhea, sinus pain, sneezing, tinnitus, trouble swallowing and voice change.   Eyes:  Negative.   Respiratory: Positive for cough (intermittent ) and chest tightness (denies currently - did have last night with cough/ congestion in chest ). Negative for apnea, choking, shortness of breath, wheezing and stridor.   Cardiovascular: Negative.   Gastrointestinal: Negative.   Endocrine: Negative.   Genitourinary: Negative.   Musculoskeletal: Negative.  Negative for neck pain.  Skin: Negative.   Neurological: Negative.  Negative for headaches.  Hematological: Negative.   Psychiatric/Behavioral: Negative.        Objective:   Physical Exam  Constitutional: She is oriented to person, place, and time. Vital signs are normal. She appears well-developed and well-nourished. She is active.  Non-toxic appearance. She does not have a sickly appearance. She does not appear ill. No distress.  HENT:  Head: Normocephalic and atraumatic.  Right Ear: Hearing, external ear and ear canal normal. Tympanic membrane is not perforated and not erythematous. A middle ear effusion (moderate effusion bilaterally with clear fluid ) is present.  Left Ear: Hearing, external ear and ear canal normal. Tympanic membrane is not perforated and not erythematous. A middle ear effusion is present.  Nose: Mucosal edema and rhinorrhea present. Right sinus exhibits maxillary sinus tenderness. Right sinus exhibits no frontal sinus tenderness. Left sinus exhibits maxillary sinus tenderness. Left sinus exhibits no frontal sinus tenderness.  Mouth/Throat: Uvula is midline and mucous membranes are normal. Posterior oropharyngeal erythema (yellow post nasal drip visualized) present. No oropharyngeal exudate, posterior oropharyngeal edema or tonsillar abscesses. Tonsils are 0 on the right. Tonsils are 0 on the left. No tonsillar exudate.  Eyes:  Pupils are equal, round, and reactive to light. Conjunctivae, EOM and lids are normal.  Neck: Trachea normal, normal range of motion, full passive range of motion without pain and  phonation normal. Neck supple. No Brudzinski's sign noted.  Cardiovascular: Normal rate, regular rhythm, normal heart sounds, intact distal pulses and normal pulses. Exam reveals no gallop and no friction rub.  No murmur heard. Pulmonary/Chest: Effort normal and breath sounds normal. No accessory muscle usage or stridor. No apnea, no tachypnea and no bradypnea. No respiratory distress. She has no decreased breath sounds. She has no wheezes. She exhibits no tenderness.  Abdominal: Soft.  Musculoskeletal: Normal range of motion.  Lymphadenopathy:       Head (right side): No submental, no submandibular, no tonsillar, no preauricular, no posterior auricular and no occipital adenopathy present.       Head (left side): No submental, no submandibular, no tonsillar, no preauricular, no posterior auricular and no occipital adenopathy present.    She has no cervical adenopathy.  Neurological: She is alert and oriented to person, place, and time.  Patient is alert and oriented and responsive to questions Engages in eye contact with provider. Speaks in full sentences without any pauses without any shortness of breath or distress.   Patient moves on and off of exam table and in room without difficulty. Gait is normal in hall and in room. Patient is oriented to person place time and situation. Patient answers questions appropriately and engages in conversation.   Skin: Skin is warm, dry and intact. Capillary refill takes less than 2 seconds. No rash noted. She is not diaphoretic. No cyanosis.  Psychiatric: She has a normal mood and affect. Her speech is normal and behavior is normal. Judgment and thought content normal. Cognition and memory are normal.  Vitals reviewed.      Assessment:     Eustachian tube dysfunction, bilateral  Acute non-recurrent sinusitis, unspecified location      Plan:     Continue Zyrtec and Flonase daily per package instructions.   Meds ordered this encounter  Medications   . doxycycline (VIBRA-TABS) 100 MG tablet    Sig: Take 1 tablet (100 mg total) by mouth 2 (two) times daily.    Dispense:  20 tablet    Refill:  0  . predniSONE (STERAPRED UNI-PAK 21 TAB) 10 MG (21) TBPK tablet    Sig: PO: Take 6 tablets on day 1:Take 5 tablets day 2:Take 4 tablets day 3: Take 3 tablets day 4:Take 2 tablets day five: 5 Take 1 tablet day 6    Dispense:  21 tablet    Refill:  0    Advised patient call the office or your primary care doctor for an appointment if no improvement within 72 hours or if any symptoms change or worsen at any time  Advised ER or urgent Care if after hours or on weekend. Call 911 for emergency symptoms at any time.Patinet verbalized understanding of all instructions given/reviewed and treatment plan and has no further questions or concerns at this time.    Patient verbalized understanding of all instructions given and denies any further questions at this time.

## 2018-09-18 MED FILL — NATURE-THROID 65 MG TABLET: 65 | 30 days supply | Qty: 30 | Fill #1

## 2018-09-19 ENCOUNTER — Ambulatory Visit: Payer: 59 | Admitting: Certified Nurse Midwife

## 2018-10-17 MED FILL — NATURE-THROID 65 MG TABLET: 65 | 30 days supply | Qty: 30 | Fill #2

## 2018-11-15 MED FILL — NATURE-THROID 65 MG TABLET: 65 | 30 days supply | Qty: 30 | Fill #3

## 2018-12-03 ENCOUNTER — Ambulatory Visit: Payer: Self-pay | Admitting: Physician Assistant

## 2018-12-03 VITALS — BP 120/76 | HR 76 | Temp 98.7°F | Resp 18 | Wt 200.4 lb

## 2018-12-03 DIAGNOSIS — J0100 Acute maxillary sinusitis, unspecified: Secondary | ICD-10-CM

## 2018-12-03 MED ORDER — DOXYCYCLINE HYCLATE 100 MG PO TABS: 100.0000 mg | ORAL_TABLET | Freq: Two times a day (BID) | ORAL | 0 refills | Status: DC

## 2018-12-03 MED ORDER — AZELASTINE HCL 0.1 % NA SOLN: 1.0000 | Freq: Two times a day (BID) | NASAL | 0 refills | Status: DC

## 2018-12-03 MED ORDER — PSEUDOEPHEDRINE HCL 60 MG PO TABS: 60.0000 mg | ORAL_TABLET | Freq: Three times a day (TID) | ORAL | 0 refills | Status: DC | PRN

## 2018-12-03 MED FILL — AZELASTINE HCL 137 MCG/SPRA: 137 | 30 days supply | Qty: 30 | Fill #0

## 2018-12-03 MED FILL — SUDOGEST 60 MG TABLET: 60 | 6 days supply | Qty: 20 | Fill #0

## 2018-12-03 MED FILL — DOXYCYCLINE HYCLATE 100 MG: 100 | 10 days supply | Qty: 20 | Fill #0

## 2018-12-03 NOTE — Progress Notes (Signed)
MRN: [id number] DOB: 11/30/1981  Subjective:   Tara Rosales is a 37 y.o. female presenting for chief complaint of headache, congestion, coughx7(self Pay) . Reports 1 week history of worsening sinus pressure, facial pain, sinus headache, nasal congestion, and right-sided ear pressure.  Has associated scratchy throat and intermittent dry cough.  Denies fever, chills, visual disturbance, sputum production, wheezing, shortness of breath, nausea, vomiting, diarrhea.  Has tried Zyrtec, nasal saline rinses, over-the-counter cold and flu, and ibuprofen with no full relief. Has had sick exposure at work.  Has PMH of seasonal allergies and irritant induced asthma.  Had flu shot this season.  Denies smoking.  Past surgical history of tonsillectomy.  Denies any other aggravating or relieving factors, no other questions or concerns.  Review of Systems  Constitutional: Negative for diaphoresis.  Gastrointestinal: Negative for abdominal pain.  Skin: Negative for rash.  Neurological: Negative for dizziness.    Tara Rosales has a current medication list which includes the following prescription(s): b complex vitamins, calcium carb-cholecalciferol, magnesium, magnesium, multivitamin, nature-throid, omega-3, prednisone, synthroid, cetirizine, vitamin d, doxycycline, fluconazole, fluticasone, and loratadine-pseudoephedrine. Also is allergic to penicillins and sulfur.  Tara Rosales  has a past medical history of Abnormal Pap smear of cervix, Allergy, History of chicken pox, Hypothyroidism, and Hypothyroidism (09/19/2016). Also  has a past surgical history that includes Tonsillectomy; Breast reduction surgery; Lymph node dissection; Cervical biopsy w/ loop electrode excision (2004); Wisdom tooth extraction (1995); Adenoidectomy; and Colposcopy.   Objective:   Vitals: BP 120/76 (BP Location: Right Arm, Patient Position: Sitting, Cuff Size: Normal)   Pulse 76   Temp 98.7 F (37.1 C) (Oral)   Resp 18   Wt 200 lb 6.4 oz  (90.9 kg)   SpO2 98%   BMI 30.70 kg/m   Physical Exam Vitals signs reviewed.  Constitutional:      General: She is not in acute distress.    Appearance: She is well-developed. She is not toxic-appearing.  HENT:     Head: Normocephalic and atraumatic.     Right Ear: Ear canal and external ear normal. A middle ear effusion is present. Tympanic membrane is not erythematous or bulging.     Left Ear: Tympanic membrane, ear canal and external ear normal.     Nose: Mucosal edema present.     Right Turbinates: Enlarged.     Left Turbinates: Enlarged.     Right Sinus: Maxillary sinus tenderness present. No frontal sinus tenderness.     Left Sinus: Maxillary sinus tenderness present. No frontal sinus tenderness.     Mouth/Throat:     Pharynx: Uvula midline. Posterior oropharyngeal erythema (cobbelstoning of posterior oropharynx noted) present. No oropharyngeal exudate.     Tonsils: Swelling: 0 on the right. 0 on the left.  Eyes:     Extraocular Movements: Extraocular movements intact.     Conjunctiva/sclera: Conjunctivae normal.     Pupils: Pupils are equal, round, and reactive to light.  Neck:     Musculoskeletal: Normal range of motion.  Cardiovascular:     Rate and Rhythm: Normal rate and regular rhythm.     Heart sounds: Normal heart sounds.  Pulmonary:     Effort: Pulmonary effort is normal.     Breath sounds: Normal breath sounds. No decreased breath sounds, wheezing, rhonchi or rales.  Lymphadenopathy:     Head:     Right side of head: No submental, submandibular, tonsillar, preauricular, posterior auricular or occipital adenopathy.     Left side of head: No submental, submandibular, tonsillar, preauricular,  posterior auricular or occipital adenopathy.     Cervical: No cervical adenopathy.     Upper Body:     Right upper body: No supraclavicular adenopathy.     Left upper body: No supraclavicular adenopathy.  Skin:    General: Skin is warm and dry.  Neurological:     Mental  Status: She is alert.     Cranial Nerves: Cranial nerves are intact.     No results found for this or any previous visit (from the past 24 hour(s)).  Assessment and Plan :  1. Acute non-recurrent maxillary sinusitis Patient is overall well appearing, NAD. VSS. No acute findings on neuro exam. Due to duration, symptoms, and no improvement with conservative management, will treat with oral antibiotics at this time. Recommend continuing symptomatic treatment as well. Advised to f/u with family doctor if no improvement with treatment plan or to seek care sooner if symptoms worsen/develops new concerning symptoms. Patient voices understanding.  - doxycycline (VIBRA-TABS) 100 MG tablet; Take 1 tablet (100 mg total) by mouth 2 (two) times daily.  Dispense: 20 tablet; Refill: 0 - azelastine (ASTELIN) 0.1 % nasal spray; Place 1 spray into both nostrils 2 (two) times daily. Use in each nostril as directed  Dispense: 30 mL; Refill: 0 - pseudoephedrine (SUDAFED) 60 MG tablet; Take 1 tablet (60 mg total) by mouth every 8 (eight) hours as needed for congestion.  Dispense: 20 tablet; Refill: 0    Benjiman CoreBrittany Vilas Edgerly, Cordelia Poche-C  [facility] 12/03/2018 11:38 AM

## 2018-12-03 NOTE — Patient Instructions (Addendum)
Sinusitis, Adult  Continue zyrtec. Start flonase.  Start astelin.  Start sudafed.  Start doxycycline. Nasal saline rinses at night time.   Follow up with primary care if no improvement or symptoms worsen.    Sinusitis is soreness and swelling (inflammation) of your sinuses. Sinuses are hollow spaces in the bones around your face. They are located:  Around your eyes.  In the middle of your forehead.  Behind your nose.  In your cheekbones. Your sinuses and nasal passages are lined with a fluid called mucus. Mucus drains out of your sinuses. Swelling can trap mucus in your sinuses. This lets germs (bacteria, virus, or fungus) grow, which leads to infection. Most of the time, this condition is caused by a virus. What are the causes? This condition is caused by:  Allergies.  Asthma.  Germs.  Things that block your nose or sinuses.  Growths in the nose (nasal polyps).  Chemicals or irritants in the air.  Fungus (rare). What increases the risk? You are more likely to develop this condition if:  You have a weak body defense system (immune system).  You do a lot of swimming or diving.  You use nasal sprays too much.  You smoke. What are the signs or symptoms? The main symptoms of this condition are pain and a feeling of pressure around the sinuses. Other symptoms include:  Stuffy nose (congestion).  Runny nose (drainage).  Swelling and warmth in the sinuses.  Headache.  Toothache.  A cough that may get worse at night.  Mucus that collects in the throat or the back of the nose (postnasal drip).  Being unable to smell and taste.  Being very tired (fatigue).  A fever.  Sore throat.  Bad breath. How is this diagnosed? This condition is diagnosed based on:  Your symptoms.  Your medical history.  A physical exam.  Tests to find out if your condition is short-term (acute) or long-term (chronic). Your doctor may: ? Check your nose for growths  (polyps). ? Check your sinuses using a tool that has a light (endoscope). ? Check for allergies or germs. ? Do imaging tests, such as an MRI or CT scan. How is this treated? Treatment for this condition depends on the cause and whether it is short-term or long-term.  If caused by a virus, your symptoms should go away on their own within 10 days. You may be given medicines to relieve symptoms. They include: ? Medicines that shrink swollen tissue in the nose. ? Medicines that treat allergies (antihistamines). ? A spray that treats swelling of the nostrils. ? Rinses that help get rid of thick mucus in your nose (nasal saline washes).  If caused by bacteria, your doctor may wait to see if you will get better without treatment. You may be given antibiotic medicine if you have: ? A very bad infection. ? A weak body defense system.  If caused by growths in the nose, you may need to have surgery. Follow these instructions at home: Medicines  Take, use, or apply over-the-counter and prescription medicines only as told by your doctor. These may include nasal sprays.  If you were prescribed an antibiotic medicine, take it as told by your doctor. Do not stop taking the antibiotic even if you start to feel better. Hydrate and humidify   Drink enough water to keep your pee (urine) pale yellow.  Use a cool mist humidifier to keep the humidity level in your home above 50%.  Breathe in steam for  10-15 minutes, 3-4 times a day, or as told by your doctor. You can do this in the bathroom while a hot shower is running.  Try not to spend time in cool or dry air. Rest  Rest as much as you can.  Sleep with your head raised (elevated).  Make sure you get enough sleep each night. General instructions   Put a warm, moist washcloth on your face 3-4 times a day, or as often as told by your doctor. This will help with discomfort.  Wash your hands often with soap and water. If there is no soap and  water, use hand sanitizer.  Do not smoke. Avoid being around people who are smoking (secondhand smoke).  Keep all follow-up visits as told by your doctor. This is important. Contact a doctor if:  You have a fever.  Your symptoms get worse.  Your symptoms do not get better within 10 days. Get help right away if:  You have a very bad headache.  You cannot stop throwing up (vomiting).  You have very bad pain or swelling around your face or eyes.  You have trouble seeing.  You feel confused.  Your neck is stiff.  You have trouble breathing. Summary  Sinusitis is swelling of your sinuses. Sinuses are hollow spaces in the bones around your face.  This condition is caused by tissues in your nose that become inflamed or swollen. This traps germs. These can lead to infection.  If you were prescribed an antibiotic medicine, take it as told by your doctor. Do not stop taking it even if you start to feel better.  Keep all follow-up visits as told by your doctor. This is important. This information is not intended to replace advice given to you by your health care provider. Make sure you discuss any questions you have with your health care provider. Document Released: 05/09/2008 Document Revised: 04/23/2018 Document Reviewed: 04/23/2018 Elsevier Interactive Patient Education  2019 [employer]

## 2018-12-04 ENCOUNTER — Telehealth: Payer: Self-pay

## 2018-12-13 MED FILL — LEVOTHYROXINE 50 MCG TABLET: 50 | 30 days supply | Qty: 30 | Fill #0

## 2018-12-13 MED FILL — LIOTHYRONINE SODIUM 5 MCG T: 5 | 30 days supply | Qty: 30 | Fill #0

## 2018-12-24 NOTE — Telephone Encounter (Signed)
PHONECALL  

## 2019-01-16 MED FILL — LIOTHYRONINE SODIUM 5 MCG T: 5 | 30 days supply | Qty: 30 | Fill #1

## 2019-01-16 MED FILL — LEVOTHYROXINE 50 MCG TABLET: 50 | 30 days supply | Qty: 30 | Fill #1

## 2019-02-14 MED FILL — LEVOTHYROXINE 50 MCG TABLET: 50 | 30 days supply | Qty: 30 | Fill #2

## 2019-02-14 MED FILL — LIOTHYRONINE SODIUM 5 MCG T: 5 | 30 days supply | Qty: 30 | Fill #2

## 2020-02-21 ENCOUNTER — Encounter: Payer: Self-pay | Admitting: Certified Nurse Midwife

## 2021-11-07 ENCOUNTER — Encounter: Payer: Self-pay | Admitting: Emergency Medicine

## 2021-11-07 ENCOUNTER — Emergency Department (INDEPENDENT_AMBULATORY_CARE_PROVIDER_SITE_OTHER)
Admission: EM | Admit: 2021-11-07 | Discharge: 2021-11-07 | Disposition: A | Discharge status: Home / Self Care | Payer: BC Managed Care – PPO | Source: Home / Self Care

## 2021-11-07 ENCOUNTER — Other Ambulatory Visit: Payer: Self-pay

## 2021-11-07 DIAGNOSIS — J0100 Acute maxillary sinusitis, unspecified: Secondary | ICD-10-CM

## 2021-11-07 DIAGNOSIS — J309 Allergic rhinitis, unspecified: Secondary | ICD-10-CM

## 2021-11-07 DIAGNOSIS — J3489 Other specified disorders of nose and nasal sinuses: Secondary | ICD-10-CM | POA: Diagnosis not present

## 2021-11-07 MED ORDER — CEFDINIR 300 MG PO CAPS: 300.0000 mg | ORAL_CAPSULE | Freq: Two times a day (BID) | ORAL | 0 refills | Status: AC

## 2021-11-07 MED ORDER — FEXOFENADINE HCL 180 MG PO TABS: 180.0000 mg | ORAL_TABLET | Freq: Every day | ORAL | 0 refills | Status: DC

## 2021-11-07 MED ORDER — PREDNISONE 20 MG PO TABS: ORAL_TABLET | ORAL | 0 refills | Status: DC

## 2021-11-07 NOTE — ED Provider Notes (Signed)
[name]    CSN: [id number] Arrival date & time: 11/07/21  0804      History   Chief Complaint Chief Complaint  Patient presents with   Facial Pain   Headache    HPI Lyndie Vanderloop is a 40 y.o. female.   HPI 40 year old female presents with sinus nasal congestion, postnasal drainage, ear fullness, sinus pressure, sinus headache for 2 weeks.  Past Medical History:  Diagnosis Date   Abnormal Pap smear of cervix    2004 & 2009   Allergy    History of chicken pox    Hypothyroidism    Hypothyroidism 09/19/2016    Patient Active Problem List   Diagnosis Date Noted   Hypothyroidism 09/19/2016   Vitamin D deficiency 03/26/2015   Abnormal Pap smear of cervix 07/24/2014    Class: History of   Asthma 05/07/2011   FOOT PAIN 05/07/2011    Past Surgical History:  Procedure Laterality Date   ADENOIDECTOMY     BREAST REDUCTION SURGERY     CERVICAL BIOPSY  W/ LOOP ELECTRODE EXCISION  2004   COLPOSCOPY     2004 & 2009   LYMPH NODE DISSECTION     TONSILLECTOMY     WISDOM TOOTH EXTRACTION  1995    OB History     Gravida  0   Para  0   Term  0   Preterm  0   AB  0   Living  0      SAB  0   IAB  0   Ectopic  0   Multiple  0   Live Births               Home Medications    Prior to Admission medications   Medication Sig Start Date End Date Taking? Authorizing Provider  cefdinir (OMNICEF) 300 MG capsule Take 1 capsule (300 mg total) by mouth 2 (two) times daily for 7 days. 11/07/21 11/14/21 Yes Trevor Iha, FNP  cetirizine (ZYRTEC) 10 MG tablet Take 10 mg by mouth as needed for allergies.   Yes [provider]  fexofenadine (ALLEGRA ALLERGY) 180 MG tablet Take 1 tablet (180 mg total) by mouth daily for 15 days. 11/07/21 11/22/21 Yes Trevor Iha, FNP  fluticasone (FLONASE) 50 MCG/ACT nasal spray Place 2 sprays into both nostrils 2 (two) times daily. 01/03/18  Yes Dorena Bodo, NP  fluticasone (FLONASE) 50 MCG/ACT  nasal spray Place into the nose.   Yes [provider]  levothyroxine (SYNTHROID) 75 MCG tablet Take 75 mcg by mouth daily. 10/02/21  Yes [provider]  liothyronine (CYTOMEL) 5 MCG tablet Take 5 mcg by mouth 2 (two) times daily. 10/02/21  Yes [provider]  predniSONE (DELTASONE) 20 MG tablet Take 3 tabs PO daily x 5 days. 11/07/21  Yes Trevor Iha, FNP  azelastine (ASTELIN) 0.1 % nasal spray Place 1 spray into both nostrils 2 (two) times daily. Use in each nostril as directed 12/03/18   Benjiman Core D, PA-C  B COMPLEX VITAMINS SL Place 1 mL under the tongue daily.    [provider]  Calcium Carb-Cholecalciferol 1000-800 MG-UNIT TABS Take 4 capsules by mouth daily.    [provider]  Cholecalciferol (VITAMIN D PO) Take by mouth daily.    [provider]  doxycycline (VIBRA-TABS) 100 MG tablet Take 1 tablet (100 mg total) by mouth 2 (two) times daily. 12/03/18   Benjiman Core D, PA-C  fluconazole (DIFLUCAN) 200 MG tablet Take  one tablet today, wait 3 days, take the second tablet Patient not taking: Reported on 09/03/2018 01/03/18   Dorena Bodo, NP  loratadine-pseudoephedrine (CLARITIN-D 24-HOUR) 10-240 MG 24 hr tablet Take 1 tablet by mouth daily. Patient not taking: Reported on 09/03/2018 01/03/18   Dorena Bodo, NP  Magnesium 200 MG TABS Take by mouth.    [provider]  MAGNESIUM PO Take by mouth.    [provider]  Multiple Vitamin (MULTIVITAMIN) tablet Take 1 tablet by mouth daily.    [provider]  NATURE-THROID 65 MG tablet  08/16/18   [provider]  Omega-3 1000 MG CAPS Take by mouth.    [provider]  pseudoephedrine (SUDAFED) 60 MG tablet Take 1 tablet (60 mg total) by mouth every 8 (eight) hours as needed for congestion. 12/03/18   Benjiman Core D, PA-C  SYNTHROID 50 MCG tablet TAKE 1 TABLET (50 MCG TOTAL) BY MOUTH DAILY. 10/03/17   Sharlene Dory, DO    Family History Family History  Problem Relation Age of Onset   Hypertension Mother    Hypertension Father    Cancer Father        prostate   Cancer Maternal Grandmother        rectal   Breast cancer Paternal Grandmother 63   Heart attack Paternal Grandfather    Diabetes Maternal Grandfather     Social History Social History   Tobacco Use   Smoking status: Never   Smokeless tobacco: Never  Substance Use Topics   Drug use: No     Allergies   Sulfa antibiotics, Elemental sulfur, and Penicillins   Review of Systems Review of Systems  HENT:  Positive for congestion, postnasal drip, sinus pressure and sinus pain.   All other systems reviewed and are negative.   Physical Exam Triage Vital Signs ED Triage Vitals  Enc Vitals Group     BP      Pulse      Resp      Temp      Temp src      SpO2      Weight      Height      Head Circumference      Peak Flow      Pain Score      Pain Loc      Pain Edu?      Excl. in GC?    No data found.  Updated Vital Signs BP 127/89 (BP Location: Left Arm)   Pulse 70   Temp 99.3 F (37.4 C) (Oral)   Resp 16   SpO2 100%       Physical Exam Vitals and nursing note reviewed.  Constitutional:      Appearance: Normal appearance. She is obese.  HENT:     Head: Normocephalic and atraumatic.     Right Ear: Tympanic membrane and external ear normal.     Left Ear: Tympanic membrane and external ear normal.     Ears:     Comments: Moderate eustachian tube dysfunction noted bilaterally    Nose:     Right Sinus: Maxillary sinus tenderness present.     Left Sinus: Maxillary sinus tenderness present.     Comments: Turbinates are erythematous/edematous    Mouth/Throat:     Mouth: Mucous membranes are moist.     Pharynx: Oropharynx is clear.     Comments: Moderate amount of clear drainage of posterior oropharynx noted Eyes:     Extraocular  Movements: Extraocular movements intact.     Conjunctiva/sclera:  Conjunctivae normal.     Pupils: Pupils are equal, round, and reactive to light.  Cardiovascular:     Rate and Rhythm: Normal rate and regular rhythm.     Pulses: Normal pulses.     Heart sounds: Normal heart sounds.  Pulmonary:     Effort: Pulmonary effort is normal.     Breath sounds: Normal breath sounds.  Musculoskeletal:        General: Normal range of motion.     Cervical back: Normal range of motion and neck supple.  Skin:    General: Skin is warm and dry.  Neurological:     General: No focal deficit present.     Mental Status: She is alert and oriented to person, place, and time.     UC Treatments / Results  Labs (all labs ordered are listed, but only abnormal results are displayed) Labs Reviewed - No data to display  EKG   Radiology No results found.  Procedures Procedures (including critical care time)  Medications Ordered in UC Medications - No data to display  Initial Impression / Assessment and Plan / UC Course  I have reviewed the triage vital signs and the nursing notes.  Pertinent labs & imaging results that were available during my care of the patient were reviewed by me and considered in my medical decision making (see chart for details).     MDM: 1. Acute maxillary sinusitis-Rx'd Cefdinir; 2.  Sinus pressure-Rx Prednisone; 3.  Allergic rhinitis-Rx'd Allegra. Advised patient to take medication as directed with food to completion. Advised patient to discontinue Sudafed.  Advised patient to take Prednisone and Allegra with first dose of Cefdinir for 5 of 7 days.  Advised may use Allegra afterwards for postnasal drip/drainage as needed.  Encouraged patient to increase daily water intake while taking these medications.  Patient discharged home, hemodynamically stable. Final Clinical Impressions(s) / UC Diagnoses   Final diagnoses:  Acute maxillary sinusitis, recurrence not specified  Sinus pressure  Allergic rhinitis, unspecified seasonality,  unspecified trigger     Discharge Instructions      Advised patient to take medication as directed with food to completion.  Advised patient to discontinue Sudafed.  Advised patient to take Prednisone and Allegra with first dose of Cefdinir for 5 of 7 days.  Advised may use Allegra afterwards for postnasal drip/drainage as needed.  Encouraged patient to increase daily water intake while taking these medications.     ED Prescriptions     Medication Sig Dispense Auth. Provider   cefdinir (OMNICEF) 300 MG capsule Take 1 capsule (300 mg total) by mouth 2 (two) times daily for 7 days. 14 capsule Trevor Iha, FNP   predniSONE (DELTASONE) 20 MG tablet Take 3 tabs PO daily x 5 days. 15 tablet Trevor Iha, FNP   fexofenadine [facility] ALLERGY) 180 MG tablet Take 1 tablet (180 mg total) by mouth daily for 15 days. 15 tablet Trevor Iha, FNP      PDMP not reviewed this encounter.   Trevor Iha, FNP 11/07/21 [id number]

## 2021-11-07 NOTE — ED Triage Notes (Signed)
Patient presents to Urgent Care with complaints of sinus pressure and headache since 2 weeks. Patient reports sinus pressure, headache, ear fullness, postnasal drainage. Denies fever or chills. Negative home covid test. No known sick contacts. Has tried Advil/Tylenol, Zyrtec and Flonase.

## 2021-11-07 NOTE — Discharge Instructions (Addendum)
Advised patient to take medication as directed with food to completion.  Advised patient to discontinue Sudafed.  Advised patient to take Prednisone and Allegra with first dose of Cefdinir for 5 of 7 days.  Advised may use Allegra afterwards for postnasal drip/drainage as needed.  Encouraged patient to increase daily water intake while taking these medications.

## 2021-11-14 ENCOUNTER — Telehealth: Payer: BC Managed Care – PPO | Admitting: Nurse Practitioner

## 2021-11-14 DIAGNOSIS — J0140 Acute pansinusitis, unspecified: Secondary | ICD-10-CM | POA: Diagnosis not present

## 2021-11-14 MED ORDER — DOXYCYCLINE HYCLATE 100 MG PO TABS
100.0000 mg | ORAL_TABLET | Freq: Two times a day (BID) | ORAL | 0 refills | Status: AC
Start: 2021-11-14 — End: 2021-11-24

## 2021-11-14 NOTE — Progress Notes (Signed)
Virtual Visit Consent   Tara Rosales, you are scheduled for a virtual visit with a [facility] provider today.     Just as with appointments in the office, your consent must be obtained to participate.  Your consent will be active for this visit and any virtual visit you may have with one of our providers in the next 365 days.     If you have a MyChart account, a copy of this consent can be sent to you electronically.  All virtual visits are billed to your insurance company just like a traditional visit in the office.    As this is a virtual visit, video technology does not allow for your provider to perform a traditional examination.  This may limit your provider's ability to fully assess your condition.  If your provider identifies any concerns that need to be evaluated in person or the need to arrange testing (such as labs, EKG, etc.), we will make arrangements to do so.     Although advances in technology are sophisticated, we cannot ensure that it will always work on either your end or our end.  If the connection with a video visit is poor, the visit may have to be switched to a telephone visit.  With either a video or telephone visit, we are not always able to ensure that we have a secure connection.     I need to obtain your verbal consent now.   Are you willing to proceed with your visit today? Yes   Tara Rosales has provided verbal consent on 11/14/2021 for a virtual visit (video or telephone).   Nicoletta Ba, NP   Date: 11/14/2021 10:46 AM   Virtual Visit via Video Note   I, Nicoletta Ba, connected with  Tara Rosales  ([id number], 09-29-1981) on 11/14/21 at 10:45 AM EST by a video-enabled telemedicine application and verified that I am speaking with the correct person using two identifiers.  Location: Patient: Virtual Visit Location Patient: Home Provider: Virtual Visit Location Provider: Home   I discussed the limitations of evaluation and management by  telemedicine and the availability of in person appointments. The patient expressed understanding and agreed to proceed.    History of Present Illness: Tara Rosales is a 40 y.o. who identifies as a female who was assigned female at birth, and is being seen today for continued sinus symptoms. She was seen at [facility] urgent care on 12/4. She reports dizziness with leaning forward while putting up her Christmas tree. Symptoms have worsened up until this time. She was prescribed Cefdinir and Prednisone for her symptoms which she has completed. She continues to frontal and maxillary sinus pressure/tenderness, facial swelling and HA. She also has swelling under her eyes and around her nose. She did have drainage of fluid from her ears over the past week. Denies fever, runny nose, sore throat.  HPI: HPI  Problems:  Patient Active Problem List   Diagnosis Date Noted   Hypothyroidism 09/19/2016   Vitamin D deficiency 03/26/2015   Abnormal Pap smear of cervix 07/24/2014    Class: History of   Asthma 05/07/2011   FOOT PAIN 05/07/2011    Allergies:  Allergies  Allergen Reactions   Sulfa Antibiotics Hives    Intense Hives   Elemental Sulfur Hives   Penicillins Hives   Medications:  Current Outpatient Medications:    azelastine (ASTELIN) 0.1 % nasal spray, Place 1 spray into both nostrils 2 (two) times daily. Use in each nostril as directed, Disp: 30  mL, Rfl: 0   B COMPLEX VITAMINS SL, Place 1 mL under the tongue daily., Disp: , Rfl:    Calcium Carb-Cholecalciferol 1000-800 MG-UNIT TABS, Take 4 capsules by mouth daily., Disp: , Rfl:    cefdinir (OMNICEF) 300 MG capsule, Take 1 capsule (300 mg total) by mouth 2 (two) times daily for 7 days., Disp: 14 capsule, Rfl: 0   cetirizine (ZYRTEC) 10 MG tablet, Take 10 mg by mouth as needed for allergies., Disp: , Rfl:    Cholecalciferol (VITAMIN D PO), Take by mouth daily., Disp: , Rfl:    doxycycline (VIBRA-TABS) 100 MG tablet, Take 1 tablet (100 mg  total) by mouth 2 (two) times daily., Disp: 20 tablet, Rfl: 0   fexofenadine (ALLEGRA ALLERGY) 180 MG tablet, Take 1 tablet (180 mg total) by mouth daily for 15 days., Disp: 15 tablet, Rfl: 0   fluconazole (DIFLUCAN) 200 MG tablet, Take one tablet today, wait 3 days, take the second tablet (Patient not taking: Reported on 09/03/2018), Disp: 2 tablet, Rfl: 0   fluticasone (FLONASE) 50 MCG/ACT nasal spray, Place 2 sprays into both nostrils 2 (two) times daily., Disp: 15.8 g, Rfl: 0   fluticasone (FLONASE) 50 MCG/ACT nasal spray, Place into the nose., Disp: , Rfl:    levothyroxine (SYNTHROID) 75 MCG tablet, Take 75 mcg by mouth daily., Disp: , Rfl:    liothyronine (CYTOMEL) 5 MCG tablet, Take 5 mcg by mouth 2 (two) times daily., Disp: , Rfl:    loratadine-pseudoephedrine (CLARITIN-D 24-HOUR) 10-240 MG 24 hr tablet, Take 1 tablet by mouth daily. (Patient not taking: Reported on 09/03/2018), Disp: 30 tablet, Rfl: 0   Magnesium 200 MG TABS, Take by mouth., Disp: , Rfl:    MAGNESIUM PO, Take by mouth., Disp: , Rfl:    Multiple Vitamin (MULTIVITAMIN) tablet, Take 1 tablet by mouth daily., Disp: , Rfl:    NATURE-THROID 65 MG tablet, , Disp: , Rfl: 3   Omega-3 1000 MG CAPS, Take by mouth., Disp: , Rfl:    predniSONE (DELTASONE) 20 MG tablet, Take 3 tabs PO daily x 5 days., Disp: 15 tablet, Rfl: 0   pseudoephedrine (SUDAFED) 60 MG tablet, Take 1 tablet (60 mg total) by mouth every 8 (eight) hours as needed for congestion., Disp: 20 tablet, Rfl: 0   SYNTHROID 50 MCG tablet, TAKE 1 TABLET (50 MCG TOTAL) BY MOUTH DAILY., Disp: 30 tablet, Rfl: 2  Observations/Objective: Patient is well-developed, well-nourished in no acute distress.  Resting comfortably at home.  Head is normocephalic, atraumatic.  No labored breathing.  Speech is clear and coherent with logical content.  Patient is alert and oriented at baseline.    Assessment and Plan: 1. Acute pansinusitis, recurrence not specified - doxycycline  (VIBRA-TABS) 100 MG tablet; Take 1 tablet (100 mg total) by mouth 2 (two) times daily for 10 days.  Dispense: 20 tablet; Refill: 0  Symptoms continue to persist after completing Cefdinir. Will prescribe Doxycycline for continued symptoms. Continue use of Flonase daily. Increase fluids, get plenty of rest. Discussion with patient regarding symptoms continuing to persist after this round of antibiotics, if no resolution, she will need to follow-up with PCP. Also if dizziness continues to persist. Follow-up in the ER if inability to ambulate, nausea, vomiting, etc. Patient verbalizes understanding, all questions answered  Follow Up Instructions: I discussed the assessment and treatment plan with the patient. The patient was provided an opportunity to ask questions and all were answered. The patient agreed with the plan and demonstrated an  understanding of the instructions.  A copy of instructions were sent to the patient via MyChart unless otherwise noted below.   The patient was advised to call back or seek an in-person evaluation if the symptoms worsen or if the condition fails to improve as anticipated.  Time:  I spent 15 minutes with the patient via telehealth technology discussing the above problems/concerns.    Nicoletta Ba, NP

## 2021-11-14 NOTE — Patient Instructions (Signed)
Tara Rosales, thank you for joining Nicoletta Ba, NP for today's virtual visit.  While this provider is not your primary care provider (PCP), if your PCP is located in our provider database this encounter information will be shared with them immediately following your visit.  Consent: (Patient) Tara Rosales provided verbal consent for this virtual visit at the beginning of the encounter.  Current Medications:  Current Outpatient Medications:    doxycycline (VIBRA-TABS) 100 MG tablet, Take 1 tablet (100 mg total) by mouth 2 (two) times daily for 10 days., Disp: 20 tablet, Rfl: 0   azelastine (ASTELIN) 0.1 % nasal spray, Place 1 spray into both nostrils 2 (two) times daily. Use in each nostril as directed, Disp: 30 mL, Rfl: 0   B COMPLEX VITAMINS SL, Place 1 mL under the tongue daily., Disp: , Rfl:    Calcium Carb-Cholecalciferol 1000-800 MG-UNIT TABS, Take 4 capsules by mouth daily., Disp: , Rfl:    cefdinir (OMNICEF) 300 MG capsule, Take 1 capsule (300 mg total) by mouth 2 (two) times daily for 7 days., Disp: 14 capsule, Rfl: 0   cetirizine (ZYRTEC) 10 MG tablet, Take 10 mg by mouth as needed for allergies., Disp: , Rfl:    Cholecalciferol (VITAMIN D PO), Take by mouth daily., Disp: , Rfl:    doxycycline (VIBRA-TABS) 100 MG tablet, Take 1 tablet (100 mg total) by mouth 2 (two) times daily., Disp: 20 tablet, Rfl: 0   fexofenadine (ALLEGRA ALLERGY) 180 MG tablet, Take 1 tablet (180 mg total) by mouth daily for 15 days., Disp: 15 tablet, Rfl: 0   fluconazole (DIFLUCAN) 200 MG tablet, Take one tablet today, wait 3 days, take the second tablet (Patient not taking: Reported on 09/03/2018), Disp: 2 tablet, Rfl: 0   fluticasone (FLONASE) 50 MCG/ACT nasal spray, Place 2 sprays into both nostrils 2 (two) times daily., Disp: 15.8 g, Rfl: 0   fluticasone (FLONASE) 50 MCG/ACT nasal spray, Place into the nose., Disp: , Rfl:    levothyroxine (SYNTHROID) 75 MCG tablet, Take 75 mcg by mouth daily.,  Disp: , Rfl:    liothyronine (CYTOMEL) 5 MCG tablet, Take 5 mcg by mouth 2 (two) times daily., Disp: , Rfl:    loratadine-pseudoephedrine (CLARITIN-D 24-HOUR) 10-240 MG 24 hr tablet, Take 1 tablet by mouth daily. (Patient not taking: Reported on 09/03/2018), Disp: 30 tablet, Rfl: 0   Magnesium 200 MG TABS, Take by mouth., Disp: , Rfl:    MAGNESIUM PO, Take by mouth., Disp: , Rfl:    Multiple Vitamin (MULTIVITAMIN) tablet, Take 1 tablet by mouth daily., Disp: , Rfl:    NATURE-THROID 65 MG tablet, , Disp: , Rfl: 3   Omega-3 1000 MG CAPS, Take by mouth., Disp: , Rfl:    predniSONE (DELTASONE) 20 MG tablet, Take 3 tabs PO daily x 5 days., Disp: 15 tablet, Rfl: 0   pseudoephedrine (SUDAFED) 60 MG tablet, Take 1 tablet (60 mg total) by mouth every 8 (eight) hours as needed for congestion., Disp: 20 tablet, Rfl: 0   SYNTHROID 50 MCG tablet, TAKE 1 TABLET (50 MCG TOTAL) BY MOUTH DAILY., Disp: 30 tablet, Rfl: 2   Medications ordered in this encounter:  Meds ordered this encounter  Medications   doxycycline (VIBRA-TABS) 100 MG tablet    Sig: Take 1 tablet (100 mg total) by mouth 2 (two) times daily for 10 days.    Dispense:  20 tablet    Refill:  0     *If you need refills on other medications  prior to your next appointment, please contact your pharmacy*  Follow-Up: Call back or seek an in-person evaluation if the symptoms worsen or if the condition fails to improve as anticipated.  Other Instructions Will start 2nd round of antibiotics. Continue Flonase as directed. If symptoms do not resolve, will need to follow up with PCP. Go to the ER if dizziness worsens, inability to ambulate, slurred speech.   If you have been instructed to have an in-person evaluation today at a local Urgent Care facility, please use the link below. It will take you to a list of all of our available [facility], including address, phone number and hours of operation. Please do not delay care.  [facility]  If you or a family member do not have a primary care provider, use the link below to schedule a visit and establish care. When you choose a [facility] primary care physician or advanced practice provider, you gain a long-term partner in health. Find a Primary Care Provider  Learn more about [facility]'s in-office and virtual care options: [facility] - Get Care Now

## 2021-11-15 ENCOUNTER — Telehealth: Payer: Self-pay

## 2021-11-15 NOTE — Telephone Encounter (Signed)
Pt scheduled for NP/re-estb visit in January.

## 2021-11-15 NOTE — Telephone Encounter (Signed)
Pt called wanting to know if she could re-establish care with PCP.  She stated the only reason she left was due to a change in her insurance plan.  She was last seen in 2018.  Please advise.  She stated her parents are patients here as well.

## 2021-12-10 ENCOUNTER — Encounter: Payer: Self-pay | Admitting: Family Medicine

## 2021-12-10 ENCOUNTER — Ambulatory Visit: Payer: BC Managed Care – PPO | Admitting: Family Medicine

## 2021-12-10 VITALS — BP 108/68 | HR 70 | Temp 98.0°F | Ht 68.0 in | Wt 214.0 lb

## 2021-12-10 DIAGNOSIS — Z1231 Encounter for screening mammogram for malignant neoplasm of breast: Secondary | ICD-10-CM | POA: Diagnosis not present

## 2021-12-10 DIAGNOSIS — E039 Hypothyroidism, unspecified: Secondary | ICD-10-CM

## 2021-12-10 DIAGNOSIS — E559 Vitamin D deficiency, unspecified: Secondary | ICD-10-CM

## 2021-12-10 DIAGNOSIS — Z0000 Encounter for general adult medical examination without abnormal findings: Secondary | ICD-10-CM

## 2021-12-10 NOTE — Patient Instructions (Signed)
Call Center for [facility] at [facility] at [phone] for an appointment.  They are located at [address], [place], [place], [address] (right across the hall from our office).  If you do not hear anything about your referral in the next 1-2 weeks, call our office and ask for an update.

## 2021-12-10 NOTE — Progress Notes (Signed)
Chief Complaint  Patient presents with   New Patient (Initial Visit)     Well Woman Tara Rosales is here for a complete physical.   Her last physical was >1 year ago.  Current diet: in general, a "healthy" diet. Current exercise: walking. Weight is overall stable and she is having fatigue Seatbelt? Yes Advanced directive? No  Health Maintenance Pap/HPV- Yes Mammogram- No Tetanus- Yes Hep C screening- Yes HIV screening- Yes  Past Medical History:  Diagnosis Date   Abnormal Pap smear of cervix    2004 & 2009   Allergy    History of chicken pox    Hypothyroidism    Hypothyroidism 09/19/2016     Past Surgical History:  Procedure Laterality Date   ADENOIDECTOMY     BREAST REDUCTION SURGERY     CERVICAL BIOPSY  W/ LOOP ELECTRODE EXCISION  2004   COLPOSCOPY     2004 & 2009   LYMPH NODE DISSECTION     TONSILLECTOMY     WISDOM TOOTH EXTRACTION  1995    Medications  Current Outpatient Medications on File Prior to Visit  Medication Sig Dispense Refill   cetirizine (ZYRTEC) 10 MG tablet Take 10 mg by mouth as needed for allergies.     fluticasone (FLONASE) 50 MCG/ACT nasal spray Place into the nose.     levothyroxine (SYNTHROID) 75 MCG tablet Take 75 mcg by mouth daily.     liothyronine (CYTOMEL) 5 MCG tablet Take 5 mcg by mouth 2 (two) times daily.     Allergies Allergies  Allergen Reactions   Sulfa Antibiotics Hives    Intense Hives   Elemental Sulfur Hives   Penicillins Hives    Review of Systems: Constitutional:  no unexpected weight changes Eye:  no recent significant change in vision Ear/Nose/Mouth/Throat:  Ears:  no recent change in hearing Nose/Mouth/Throat:  no complaints of nasal congestion, no sore throat Cardiovascular: no chest pain Respiratory:  no shortness of breath Gastrointestinal:  no abdominal pain, no change in bowel habits GU:  Female: negative for dysuria or pelvic pain Musculoskeletal/Extremities:  no pain of the  joints Integumentary (Skin/Breast):  no abnormal skin lesions reported Neurologic:  no headaches Endocrine:  denies fatigue Hematologic/Lymphatic:  No areas of easy bleeding  Exam BP 108/68    Pulse 70    Temp 98 F (36.7 C) (Oral)    Ht 5\' 8"  (1.727 m)    Wt 214 lb (97.1 kg)    SpO2 99%    BMI 32.54 kg/m  General:  well developed, well nourished, in no apparent distress Skin:  no significant moles, warts, or growths Head:  no masses, lesions, or tenderness Eyes:  pupils equal and round, sclera anicteric without injection Ears:  canals without lesions, TMs shiny without retraction, no obvious effusion, no erythema Nose:  nares patent, septum midline, mucosa normal, and no drainage or sinus tenderness Throat/Pharynx:  lips and gingiva without lesion; tongue and uvula midline; non-inflamed pharynx; no exudates or postnasal drainage Neck: neck supple without adenopathy, thyromegaly, or masses Lungs:  clear to auscultation, breath sounds equal bilaterally, no respiratory distress Cardio:  regular rate and rhythm, no LE edema Abdomen:  abdomen soft, nontender; bowel sounds normal; no masses or organomegaly Genital: Defer to GYN Musculoskeletal:  symmetrical muscle groups noted without atrophy or deformity Extremities:  no clubbing, cyanosis, or edema, no deformities, no skin discoloration Neuro:  gait normal; deep tendon reflexes normal and symmetric Psych: well oriented with normal range of affect and appropriate  judgment/insight  Assessment and Plan  Well adult exam - Plan: CBC, Comprehensive metabolic panel, Lipid panel  Encounter for screening mammogram for malignant neoplasm of breast - Plan: MM DIGITAL SCREENING BILATERAL  Hypothyroidism, unspecified type - Plan: TSH, T4, free, Ambulatory referral to Endocrinology, T3, free  Vitamin D insufficiency - Plan: VITAMIN D 25 Hydroxy (Vit-D Deficiency, Fractures)   Well 41 y.o. female. Counseled on diet and exercise. Other orders as  above. Advanced directive form provided today.  Mammogram ordered. GYN info provided.  Refer endo at her request.  Follow up in 6 mo or prn. The patient voiced understanding and agreement to the plan.  [place], DO 12/10/21 1:59 PM

## 2021-12-11 LAB — T4, FREE: Free T4: 1.1 ng/dL (ref 0.8–1.8)

## 2021-12-11 LAB — COMPREHENSIVE METABOLIC PANEL
AG Ratio: 2 (calc) (ref 1.0–2.5)
ALT: 15 U/L (ref 6–29)
AST: 17 U/L (ref 10–30)
Albumin: 4.4 g/dL (ref 3.6–5.1)
Alkaline phosphatase (APISO): 47 U/L (ref 31–125)
BUN: 8 mg/dL (ref 7–25)
CO2: 24 mmol/L (ref 20–32)
Calcium: 9.2 mg/dL (ref 8.6–10.2)
Chloride: 106 mmol/L (ref 98–110)
Creat: 0.64 mg/dL (ref 0.50–0.99)
Globulin: 2.2 g/dL (calc) (ref 1.9–3.7)
Glucose, Bld: 78 mg/dL (ref 65–99)
Potassium: 3.8 mmol/L (ref 3.5–5.3)
Sodium: 141 mmol/L (ref 135–146)
Total Bilirubin: 0.6 mg/dL (ref 0.2–1.2)
Total Protein: 6.6 g/dL (ref 6.1–8.1)

## 2021-12-11 LAB — LIPID PANEL
Cholesterol: 190 mg/dL (ref <200)
HDL: 79 mg/dL (ref >=50)
LDL Cholesterol (Calc): 98 mg/dL (calc)
Non-HDL Cholesterol (Calc): 111 mg/dL (calc) (ref <130)
Total CHOL/HDL Ratio: 2.4 (calc) (ref <5.0)
Triglycerides: 52 mg/dL (ref <150)

## 2021-12-11 LAB — CBC
HCT: 41.1 % (ref 35.0–45.0)
Hemoglobin: 14 g/dL (ref 11.7–15.5)
MCH: 32.4 pg (ref 27.0–33.0)
MCHC: 34.1 g/dL (ref 32.0–36.0)
MCV: 95.1 fL (ref 80.0–100.0)
MPV: 11.3 fL (ref 7.5–12.5)
Platelets: 208 10*3/uL (ref 140–400)
RBC: 4.32 10*6/uL (ref 3.80–5.10)
RDW: 12 % (ref 11.0–15.0)
WBC: 6.7 10*3/uL (ref 3.8–10.8)

## 2021-12-11 LAB — TSH: TSH: 1.74 mIU/L

## 2021-12-11 LAB — T3, FREE: T3, Free: 3.9 pg/mL (ref 2.3–4.2)

## 2021-12-11 LAB — VITAMIN D 25 HYDROXY (VIT D DEFICIENCY, FRACTURES): Vit D, 25-Hydroxy: 30 ng/mL (ref 30–100)

## 2021-12-22 ENCOUNTER — Ambulatory Visit: Payer: BC Managed Care – PPO | Admitting: Family Medicine

## 2022-01-02 ENCOUNTER — Encounter: Payer: Self-pay | Admitting: Family Medicine

## 2022-01-03 MED ORDER — LIOTHYRONINE SODIUM 5 MCG PO TABS: 5.0000 ug | ORAL_TABLET | Freq: Two times a day (BID) | ORAL | 0 refills | Status: DC

## 2022-01-03 MED ORDER — LEVOTHYROXINE SODIUM 75 MCG PO TABS: 75.0000 ug | ORAL_TABLET | Freq: Every day | ORAL | 0 refills | Status: DC

## 2022-01-20 ENCOUNTER — Other Ambulatory Visit: Payer: Self-pay | Admitting: Family

## 2022-01-20 ENCOUNTER — Encounter: Payer: Self-pay | Admitting: Family

## 2022-01-20 ENCOUNTER — Other Ambulatory Visit (HOSPITAL_BASED_OUTPATIENT_CLINIC_OR_DEPARTMENT_OTHER): Payer: Self-pay

## 2022-01-20 ENCOUNTER — Ambulatory Visit (INDEPENDENT_AMBULATORY_CARE_PROVIDER_SITE_OTHER): Payer: BC Managed Care – PPO | Admitting: Family

## 2022-01-20 VITALS — BP 111/83 | HR 90 | Resp 22 | Ht 68.0 in | Wt 216.4 lb

## 2022-01-20 DIAGNOSIS — J029 Acute pharyngitis, unspecified: Secondary | ICD-10-CM

## 2022-01-20 DIAGNOSIS — R051 Acute cough: Secondary | ICD-10-CM

## 2022-01-20 MED ORDER — HYDROCODONE BIT-HOMATROP MBR 5-1.5 MG/5ML PO SOLN
5.0000 mL | Freq: Three times a day (TID) | ORAL | 0 refills | Status: DC | PRN
  Filled 2022-01-20: qty 75, 5d supply, fill #0

## 2022-01-20 MED ORDER — AZITHROMYCIN 250 MG PO TABS
ORAL_TABLET | ORAL | 0 refills | Status: DC
  Filled 2022-01-20: qty 6, 5d supply, fill #0

## 2022-01-20 NOTE — Progress Notes (Signed)
Tara Rosales is a 41 y.o. female with the following history as recorded in EpicCare:  Patient Active Problem List   Diagnosis Date Noted   Hypothyroidism 09/19/2016   Vitamin D deficiency 03/26/2015   Abnormal Pap smear of cervix 07/24/2014   Asthma 05/07/2011    Current Outpatient Medications  Medication Sig Dispense Refill   cetirizine (ZYRTEC) 10 MG tablet Take 10 mg by mouth as needed for allergies.     levothyroxine (SYNTHROID) 75 MCG tablet Take 1 tablet (75 mcg total) by mouth daily. 30 tablet 0   liothyronine (CYTOMEL) 5 MCG tablet Take 1 tablet (5 mcg total) by mouth 2 (two) times daily. 60 tablet 0   fluticasone (FLONASE) 50 MCG/ACT nasal spray Place into the nose. (Patient not taking: Reported on 01/20/2022)     No current facility-administered medications for this visit.    Allergies: Sulfa antibiotics, Elemental sulfur, and Penicillins  Past Medical History:  Diagnosis Date   Abnormal Pap smear of cervix    2004 & 2009   Allergy    History of chicken pox    Hypothyroidism    Hypothyroidism 09/19/2016    Past Surgical History:  Procedure Laterality Date   ADENOIDECTOMY     BREAST REDUCTION SURGERY     CERVICAL BIOPSY  W/ LOOP ELECTRODE EXCISION  2004   COLPOSCOPY     2004 & 2009   LYMPH NODE DISSECTION     TONSILLECTOMY     WISDOM TOOTH EXTRACTION  1995    Family History  Problem Relation Age of Onset   Hypertension Mother    Hypertension Father    Cancer Father        prostate   Cancer Maternal Grandmother        rectal   Breast cancer Paternal Grandmother 42   Heart attack Paternal Grandfather    Diabetes Maternal Grandfather     Social History   Tobacco Use   Smoking status: Never   Smokeless tobacco: Never  Substance Use Topics   Alcohol use: Not on file    Subjective:  2 day history of cough/ congestion; negative COVID test on Tuesday; No fever; no OTC medications;  Woke up today with worsening symptoms; +right ear pain;   LMP- no  concerns for pregnancy;      Objective:  Vitals:   01/20/22 1308  BP: 111/83  Pulse: 90  Resp: (!) 22  SpO2: 98%  Weight: 216 lb 6.4 oz (98.2 kg)  Height: 5\' 8"  (1.727 m)    General: Well developed, well nourished, in no acute distress  Skin : Warm and dry.  Head: Normocephalic and atraumatic  Eyes: Sclera and conjunctiva clear; pupils round and reactive to light; extraocular movements intact  Ears: External normal; canals clear; tympanic membranes erythematous Oropharynx: Pink, supple. No suspicious lesions  Neck: Supple without thyromegaly, adenopathy  Lungs: Respirations unlabored; clear to auscultation bilaterally without wheeze, rales, rhonchi  CVS exam: normal rate and regular rhythm.  Neurologic: Alert and oriented; speech intact; face symmetrical; moves all extremities well; CNII-XII intact without focal deficit   Assessment:  1. Acute cough   2. Sore throat     Plan:   Rx for Z-pak #1 take as directed; Rx for Hycodan cough syrup; increase fluids, rest and follow up worse, no better.  Work note given as requested;   This visit occurred during the SARS-CoV-2 public health emergency.  Safety protocols were in place, including screening questions prior to the visit, additional usage  of staff PPE, and extensive cleaning of exam room while observing appropriate contact time as indicated for disinfecting solutions.      No follow-ups on file.  No orders of the defined types were placed in this encounter.   Requested Prescriptions    No prescriptions requested or ordered in this encounter

## 2022-01-22 ENCOUNTER — Emergency Department (INDEPENDENT_AMBULATORY_CARE_PROVIDER_SITE_OTHER)
Admission: EM | Admit: 2022-01-22 | Discharge: 2022-01-22 | Disposition: A | Discharge status: Home / Self Care | Payer: BC Managed Care – PPO | Source: Home / Self Care

## 2022-01-22 ENCOUNTER — Other Ambulatory Visit: Payer: Self-pay

## 2022-01-22 ENCOUNTER — Encounter: Payer: Self-pay | Admitting: Emergency Medicine

## 2022-01-22 DIAGNOSIS — B9689 Other specified bacterial agents as the cause of diseases classified elsewhere: Secondary | ICD-10-CM

## 2022-01-22 DIAGNOSIS — J019 Acute sinusitis, unspecified: Secondary | ICD-10-CM

## 2022-01-22 MED ORDER — PREDNISONE 20 MG PO TABS: 20.0000 mg | ORAL_TABLET | Freq: Two times a day (BID) | ORAL | 0 refills | Status: DC

## 2022-01-22 MED ORDER — METHYLPREDNISOLONE SODIUM SUCC 125 MG IJ SOLR
80.0000 mg | Freq: Once | INTRAMUSCULAR | Status: AC
  Administered 2022-01-22: 80 mg via INTRAMUSCULAR

## 2022-01-22 NOTE — ED Provider Notes (Signed)
Tara Rosales [facility]    CSN: [id number] Arrival date & time: 01/22/22  1013      History   Chief Complaint Chief Complaint  Patient presents with   Cough    HPI Tara Rosales is a 41 y.o. female.   HPI  Patient is sick with an upper respiratory infection.  She was saw her primary care doctor couple days ago.  She was started on azithromycin.  She states that in spite of this she is not getting any better.  She has a lot of sinus pressure and pain.  Pain when she leans over.  Scratchy throat.  Coughing.  Chest congestion.  She states her symptoms feel like they are getting worse.  She is drinking lots of fluids.  She is doing salt water saline sinus rinses.  She is taking the Hycodan at night.  Past Medical History:  Diagnosis Date   Abnormal Pap smear of cervix    2004 & 2009   Allergy    History of chicken pox    Hypothyroidism    Hypothyroidism 09/19/2016    Patient Active Problem List   Diagnosis Date Noted   Hypothyroidism 09/19/2016   Vitamin D deficiency 03/26/2015   Abnormal Pap smear of cervix 07/24/2014    Class: History of   Asthma 05/07/2011    Past Surgical History:  Procedure Laterality Date   ADENOIDECTOMY     BREAST REDUCTION SURGERY     CERVICAL BIOPSY  W/ LOOP ELECTRODE EXCISION  2004   COLPOSCOPY     2004 & 2009   LYMPH NODE DISSECTION     TONSILLECTOMY     WISDOM TOOTH EXTRACTION  1995    OB History     Gravida  0   Para  0   Term  0   Preterm  0   AB  0   Living  0      SAB  0   IAB  0   Ectopic  0   Multiple  0   Live Births               Home Medications    Prior to Admission medications   Medication Sig Start Date End Date Taking? Authorizing Provider  predniSONE (DELTASONE) 20 MG tablet Take 1 tablet (20 mg total) by mouth 2 (two) times daily with a meal. 01/22/22  Yes Eustace Moore, MD  azithromycin (ZITHROMAX) 250 MG tablet Take 2 tablets by mouth on day 1, then take 1 tablet daily for 4  days. 01/20/22   Olive Bass, FNP  cetirizine (ZYRTEC) 10 MG tablet Take 10 mg by mouth as needed for allergies.    [provider]  fluticasone (FLONASE) 50 MCG/ACT nasal spray Place into the nose. Patient not taking: Reported on 01/20/2022    [provider]  HYDROcodone bit-homatropine (HYCODAN) 5-1.5 MG/5ML syrup Take 5 mLs by mouth every 8 (eight) hours as needed for cough. 01/20/22   Olive Bass, FNP  levothyroxine (SYNTHROID) 75 MCG tablet Take 1 tablet (75 mcg total) by mouth daily. 01/03/22   Sharlene Dory, DO  liothyronine (CYTOMEL) 5 MCG tablet Take 1 tablet (5 mcg total) by mouth 2 (two) times daily. 01/03/22   Sharlene Dory, DO    Family History Family History  Problem Relation Age of Onset   Hypertension Mother    Hypertension Father    Cancer Father        prostate   Cancer  Maternal Grandmother        rectal   Breast cancer Paternal Grandmother 26   Heart attack Paternal Grandfather    Diabetes Maternal Grandfather     Social History Social History   Tobacco Use   Smoking status: Never   Smokeless tobacco: Never  Vaping Use   Vaping Use: Never used  Substance Use Topics   Drug use: No     Allergies   Sulfa antibiotics, Elemental sulfur, and Penicillins   Review of Systems Review of Systems See HPI  Physical Exam Triage Vital Signs ED Triage Vitals  Enc Vitals Group     BP 01/22/22 1023 122/86     Pulse Rate 01/22/22 1023 81     Resp 01/22/22 1023 15     Temp 01/22/22 1023 99.3 F (37.4 C)     Temp Source 01/22/22 1023 Oral     SpO2 01/22/22 1023 100 %     Weight 01/22/22 1027 216 lb (98 kg)     Height 01/22/22 1027 5\' 8"  (1.727 m)     Head Circumference --      Peak Flow --      Pain Score 01/22/22 1027 4     Pain Loc --      Pain Edu? --      Excl. in GC? --    No data found.  Updated Vital Signs BP 122/86 (BP Location: Left Arm)    Pulse 81    Temp 99.3 F (37.4 C) (Oral)     Resp 15    Ht 5\' 8"  (1.727 m)    Wt 98 kg    LMP 01/21/2022 (Exact Date)    SpO2 100%    BMI 32.84 kg/m      Physical Exam Constitutional:      General: She is not in acute distress.    Appearance: She is well-developed. She is ill-appearing.  HENT:     Head: Normocephalic and atraumatic.     Right Ear: Tympanic membrane and ear canal normal.     Left Ear: Tympanic membrane and ear canal normal.     Nose: Congestion and rhinorrhea present.     Mouth/Throat:     Pharynx: Posterior oropharyngeal erythema present.     Comments: Maxillary and ethmoid sinuses acutely tender.  Nasal membranes are swollen and red.  Posterior pharynx is injected Eyes:     Conjunctiva/sclera: Conjunctivae normal.     Pupils: Pupils are equal, round, and reactive to light.  Cardiovascular:     Rate and Rhythm: Normal rate.     Heart sounds: Normal heart sounds.  Pulmonary:     Effort: Pulmonary effort is normal. No respiratory distress.     Breath sounds: Rhonchi present.  Abdominal:     General: There is no distension.     Palpations: Abdomen is soft.  Musculoskeletal:        General: Normal range of motion.     Cervical back: Normal range of motion.  Lymphadenopathy:     Cervical: Cervical adenopathy present.  Skin:    General: Skin is warm and dry.  Neurological:     Mental Status: She is alert.     UC Treatments / Results  Labs (all labs ordered are listed, but only abnormal results are displayed) Labs Reviewed - No data to display  EKG   Radiology No results found.  Procedures Procedures (including critical care time)  Medications Ordered in UC Medications  methylPREDNISolone sodium succinate (SOLU-MEDROL)  125 mg/2 mL injection 80 mg (has no administration in time range)    Initial Impression / Assessment and Plan / UC Course  I have reviewed the triage vital signs and the nursing notes.  Pertinent labs & imaging results that were available during my care of the patient were  reviewed by me and considered in my medical decision making (see chart for details).     Final Clinical Impressions(s) / UC Diagnoses   Final diagnoses:  Acute bacterial sinusitis     Discharge Instructions      We have given you a shot of Solu-Medrol, steroid to start now. I have also given you a prescription for prednisone to take 2 times a day for 5 days.  Take your first dose tonight Continue to drink lots of fluids, use your saline sinus rinses, and take your antibiotic Return as needed   ED Prescriptions     Medication Sig Dispense Auth. Provider   predniSONE (DELTASONE) 20 MG tablet Take 1 tablet (20 mg total) by mouth 2 (two) times daily with a meal. 10 tablet Eustace Moore, MD      PDMP not reviewed this encounter.   Eustace Moore, MD 01/22/22 1049

## 2022-01-22 NOTE — ED Triage Notes (Addendum)
Sinus congestion & runny nose since Tuesday  Saw PCP on Thursday - started a z pak on Thursday  Neti pot sinus rinses Fri & today  Tylenol/Ibuprofen combo pill at 0945 Here today for ear pain, headache & cough - not feeling better since Thursday  Negative home COVID test on Tuesday & Thursday  COVID vaccine x 4 COVID 7/22

## 2022-01-22 NOTE — Discharge Instructions (Signed)
We have given you a shot of Solu-Medrol, steroid to start now. I have also given you a prescription for prednisone to take 2 times a day for 5 days.  Take your first dose tonight Continue to drink lots of fluids, use your saline sinus rinses, and take your antibiotic Return as needed

## 2022-01-30 ENCOUNTER — Emergency Department (INDEPENDENT_AMBULATORY_CARE_PROVIDER_SITE_OTHER)
Admission: EM | Admit: 2022-01-30 | Discharge: 2022-01-30 | Disposition: A | Discharge status: Home / Self Care | Payer: BC Managed Care – PPO | Source: Home / Self Care

## 2022-01-30 ENCOUNTER — Other Ambulatory Visit: Payer: Self-pay

## 2022-01-30 DIAGNOSIS — J309 Allergic rhinitis, unspecified: Secondary | ICD-10-CM

## 2022-01-30 DIAGNOSIS — J0100 Acute maxillary sinusitis, unspecified: Secondary | ICD-10-CM | POA: Diagnosis not present

## 2022-01-30 DIAGNOSIS — J3489 Other specified disorders of nose and nasal sinuses: Secondary | ICD-10-CM

## 2022-01-30 MED ORDER — DOXYCYCLINE HYCLATE 100 MG PO CAPS: 100.0000 mg | ORAL_CAPSULE | Freq: Two times a day (BID) | ORAL | 0 refills | Status: AC

## 2022-01-30 MED ORDER — FEXOFENADINE HCL 180 MG PO TABS
180.0000 mg | ORAL_TABLET | Freq: Every day | ORAL | 0 refills | Status: DC
Start: 2022-01-30 — End: 2022-04-05

## 2022-01-30 MED ORDER — PREDNISONE 20 MG PO TABS: ORAL_TABLET | ORAL | 0 refills | Status: DC

## 2022-01-30 NOTE — ED Triage Notes (Signed)
Pt presents to Urgent Care with c/o continued ear fullness/pressure, headache, and nasal congestion after visit to [facility] one week ago. States she has also had intermittent dizziness and vomited several times yesterday. Also reports loose stool since yesterday.

## 2022-01-30 NOTE — ED Provider Notes (Signed)
[facility]    CSN: [id number] Arrival date & time: 01/30/22  1001      History   Chief Complaint Chief Complaint  Patient presents with   Ear Fullness   Nasal Congestion   Dizziness    HPI Tara Rosales is a 41 y.o. female.   HPI 41 year old female presents with ear fullness/pressure, headache and nasal congestion for 7 days.  Patient was evaluated here on 01/22/2022 for cough given IM steroid injection and oral prednisone.  Patient's PCP had prescribed Zithromax and Hycodan prior to this office visit on 01/20/2022.  PMH significant for asthma.  Past Medical History:  Diagnosis Date   Abnormal Pap smear of cervix    2004 & 2009   Allergy    History of chicken pox    Hypothyroidism    Hypothyroidism 09/19/2016    Patient Active Problem List   Diagnosis Date Noted   Hypothyroidism 09/19/2016   Vitamin D deficiency 03/26/2015   Abnormal Pap smear of cervix 07/24/2014    Class: History of   Asthma 05/07/2011    Past Surgical History:  Procedure Laterality Date   ADENOIDECTOMY     BREAST REDUCTION SURGERY     CERVICAL BIOPSY  W/ LOOP ELECTRODE EXCISION  2004   COLPOSCOPY     2004 & 2009   LYMPH NODE DISSECTION     TONSILLECTOMY     WISDOM TOOTH EXTRACTION  1995    OB History     Gravida  0   Para  0   Term  0   Preterm  0   AB  0   Living  0      SAB  0   IAB  0   Ectopic  0   Multiple  0   Live Births               Home Medications    Prior to Admission medications   Medication Sig Start Date End Date Taking? Authorizing Provider  doxycycline (VIBRAMYCIN) 100 MG capsule Take 1 capsule (100 mg total) by mouth 2 (two) times daily for 10 days. 01/30/22 02/09/22 Yes Trevor Iha, FNP  fexofenadine [facility] ALLERGY) 180 MG tablet Take 1 tablet (180 mg total) by mouth daily for 15 days. 01/30/22 02/14/22 Yes Trevor Iha, FNP  predniSONE (DELTASONE) 20 MG tablet Take 3 tabs PO daily x 5 days. 01/30/22  Yes Trevor Iha,  FNP  azithromycin (ZITHROMAX) 250 MG tablet Take 2 tablets by mouth on day 1, then take 1 tablet daily for 4 days. 01/20/22   Olive Bass, FNP  cetirizine (ZYRTEC) 10 MG tablet Take 10 mg by mouth as needed for allergies.    [provider]  fluticasone (FLONASE) 50 MCG/ACT nasal spray Place into the nose. Patient not taking: Reported on 01/20/2022    [provider]  HYDROcodone bit-homatropine (HYCODAN) 5-1.5 MG/5ML syrup Take 5 mLs by mouth every 8 (eight) hours as needed for cough. 01/20/22   Olive Bass, FNP  levothyroxine (SYNTHROID) 75 MCG tablet Take 1 tablet (75 mcg total) by mouth daily. 01/03/22   Sharlene Dory, DO  liothyronine (CYTOMEL) 5 MCG tablet Take 1 tablet (5 mcg total) by mouth 2 (two) times daily. 01/03/22   Sharlene Dory, DO    Family History Family History  Problem Relation Age of Onset   Hypertension Mother    Hypertension Father    Cancer Father        prostate  Cancer Maternal Grandmother        rectal   Breast cancer Paternal Grandmother 28   Heart attack Paternal Grandfather    Diabetes Maternal Grandfather     Social History Social History   Tobacco Use   Smoking status: Never   Smokeless tobacco: Never  Vaping Use   Vaping Use: Never used  Substance Use Topics   Alcohol use: Yes    Alcohol/week: 0.0 - 3.0 standard drinks    Comment: occasionally   Drug use: No     Allergies   Sulfa antibiotics, Elemental sulfur, and Penicillins   Review of Systems Review of Systems  HENT:  Positive for congestion and ear pain.   All other systems reviewed and are negative.   Physical Exam Triage Vital Signs ED Triage Vitals  Enc Vitals Group     BP      Pulse      Resp      Temp      Temp src      SpO2      Weight      Height      Head Circumference      Peak Flow      Pain Score      Pain Loc      Pain Edu?      Excl. in GC?    No data found.  Updated Vital Signs BP 114/79  (BP Location: Right Arm)    Pulse 80    Temp 98.4 F (36.9 C) (Oral)    Resp 20    Ht 5\' 8"  (1.727 m)    Wt 215 lb (97.5 kg)    LMP 01/21/2022 (Exact Date)    SpO2 98%    BMI 32.69 kg/m    Physical Exam Vitals and nursing note reviewed.  Constitutional:      General: She is not in acute distress.    Appearance: Normal appearance. She is obese. She is not ill-appearing.  HENT:     Head: Normocephalic and atraumatic.     Right Ear: Tympanic membrane and external ear normal.     Left Ear: Tympanic membrane and external ear normal.     Ears:     Comments: Moderate to significant eustachian tube dysfunction noted bilaterally    Nose:     Right Sinus: Maxillary sinus tenderness present.     Left Sinus: Maxillary sinus tenderness present.     Comments: Turbinates are edematous/erythematous    Mouth/Throat:     Mouth: Mucous membranes are moist.     Pharynx: Oropharynx is clear.     Comments: Moderate amount of clear drainage of posterior oropharynx noted Eyes:     Extraocular Movements: Extraocular movements intact.     Conjunctiva/sclera: Conjunctivae normal.     Pupils: Pupils are equal, round, and reactive to light.  Cardiovascular:     Rate and Rhythm: Normal rate and regular rhythm.     Pulses: Normal pulses.     Heart sounds: Normal heart sounds.  Pulmonary:     Effort: Pulmonary effort is normal.     Breath sounds: Normal breath sounds.  Musculoskeletal:     Cervical back: Normal range of motion and neck supple.  Skin:    General: Skin is warm and dry.  Neurological:     General: No focal deficit present.     Mental Status: She is alert and oriented to person, place, and time. Mental status is at baseline.  UC Treatments / Results  Labs (all labs ordered are listed, but only abnormal results are displayed) Labs Reviewed - No data to display  EKG   Radiology No results found.  Procedures Procedures (including critical care time)  Medications Ordered in  UC Medications - No data to display  Initial Impression / Assessment and Plan / UC Course  I have reviewed the triage vital signs and the nursing notes.  Pertinent labs & imaging results that were available during my care of the patient were reviewed by me and considered in my medical decision making (see chart for details).     MDM: 1.  Acute maxillary sinusitis-Rx'd Doxycycline; 2.  Sinus pressure-Rx'd Prednisone; 3.  Allergic rhinitis-Rx'd Allegra. Advised patient to take medication as directed with food to completion.  Advised patient to take Prednisone and Allegra with first dose of Doxycycline for the next 5 of 10 days.  Advised may use Allegra afterwards as needed for concurrent postnasal drip/drainage.  Encouraged patient to increase daily water intake while taking these medications.  Patient discharged home, hemodynamically stable. Final Clinical Impressions(s) / UC Diagnoses   Final diagnoses:  Acute maxillary sinusitis, recurrence not specified  Allergic rhinitis, unspecified seasonality, unspecified trigger  Sinus pressure     Discharge Instructions      Advised patient to take medication as directed with food to completion.  Advised patient to take Prednisone and Allegra with first dose of Doxycycline for the next 5 of 10 days.  Advised may use Allegra afterwards as needed for concurrent postnasal drip/drainage.  Encouraged patient to increase daily water intake while taking these medications.     ED Prescriptions     Medication Sig Dispense Auth. Provider   doxycycline (VIBRAMYCIN) 100 MG capsule Take 1 capsule (100 mg total) by mouth 2 (two) times daily for 10 days. 20 capsule Trevor Iha, FNP   predniSONE (DELTASONE) 20 MG tablet Take 3 tabs PO daily x 5 days. 15 tablet Trevor Iha, FNP   fexofenadine [facility] ALLERGY) 180 MG tablet Take 1 tablet (180 mg total) by mouth daily for 15 days. 15 tablet Trevor Iha, FNP      PDMP not reviewed this encounter.    Trevor Iha, FNP 01/30/22 1042

## 2022-01-30 NOTE — Discharge Instructions (Addendum)
Advised patient to take medication as directed with food to completion.  Advised patient to take Prednisone and Allegra with first dose of Doxycycline for the next 5 of 10 days.  Advised may use Allegra afterwards as needed for concurrent postnasal drip/drainage.  Encouraged patient to increase daily water intake while taking these medications.

## 2022-02-01 ENCOUNTER — Telehealth: Payer: Self-pay

## 2022-02-01 ENCOUNTER — Other Ambulatory Visit: Payer: Self-pay | Admitting: Family Medicine

## 2022-02-01 MED ORDER — LEVOTHYROXINE SODIUM 75 MCG PO TABS: 75.0000 ug | ORAL_TABLET | Freq: Every day | ORAL | 2 refills | Status: DC

## 2022-02-01 MED ORDER — LIOTHYRONINE SODIUM 5 MCG PO TABS: 5.0000 ug | ORAL_TABLET | Freq: Two times a day (BID) | ORAL | 2 refills | Status: DC

## 2022-02-01 NOTE — Telephone Encounter (Signed)
Contacted patient to schedule new patient appointment and patient declined at this time. Patient states that she will give [place] a call if she decided to come to our office.

## 2022-02-08 ENCOUNTER — Ambulatory Visit (HOSPITAL_BASED_OUTPATIENT_CLINIC_OR_DEPARTMENT_OTHER)
Admission: RE | Admit: 2022-02-08 | Discharge: 2022-02-08 | Disposition: A | Discharge status: Home / Self Care | Payer: BC Managed Care – PPO | Source: Ambulatory Visit | Attending: Family Medicine | Admitting: Family Medicine

## 2022-02-08 ENCOUNTER — Encounter (HOSPITAL_BASED_OUTPATIENT_CLINIC_OR_DEPARTMENT_OTHER): Payer: Self-pay

## 2022-02-08 ENCOUNTER — Other Ambulatory Visit: Payer: Self-pay

## 2022-02-08 DIAGNOSIS — Z1231 Encounter for screening mammogram for malignant neoplasm of breast: Secondary | ICD-10-CM | POA: Diagnosis not present

## 2022-04-05 ENCOUNTER — Telehealth: Payer: Self-pay

## 2022-04-05 ENCOUNTER — Ambulatory Visit: Payer: BC Managed Care – PPO | Admitting: Family Medicine

## 2022-04-05 ENCOUNTER — Encounter: Payer: Self-pay | Admitting: Family Medicine

## 2022-04-05 VITALS — BP 108/70 | HR 74 | Temp 98.1°F | Ht 68.0 in | Wt 215.5 lb

## 2022-04-05 DIAGNOSIS — R519 Headache, unspecified: Secondary | ICD-10-CM

## 2022-04-05 DIAGNOSIS — R6889 Other general symptoms and signs: Secondary | ICD-10-CM

## 2022-04-05 DIAGNOSIS — R413 Other amnesia: Secondary | ICD-10-CM | POA: Diagnosis not present

## 2022-04-05 DIAGNOSIS — E039 Hypothyroidism, unspecified: Secondary | ICD-10-CM | POA: Diagnosis not present

## 2022-04-05 LAB — COMPREHENSIVE METABOLIC PANEL
ALT: 15 U/L (ref 0–35)
AST: 17 U/L (ref 0–37)
Albumin: 4.6 g/dL (ref 3.5–5.2)
Alkaline Phosphatase: 48 U/L (ref 39–117)
BUN: 13 mg/dL (ref 6–23)
CO2: 26 mEq/L (ref 19–32)
Calcium: 9.2 mg/dL (ref 8.4–10.5)
Chloride: 104 mEq/L (ref 96–112)
Creatinine, Ser: 1 mg/dL (ref 0.40–1.20)
GFR: 69.25 mL/min (ref >60.00)
Glucose, Bld: 79 mg/dL (ref 70–99)
Potassium: 4.3 mEq/L (ref 3.5–5.1)
Sodium: 139 mEq/L (ref 135–145)
Total Bilirubin: 0.5 mg/dL (ref 0.2–1.2)
Total Protein: 6.8 g/dL (ref 6.0–8.3)

## 2022-04-05 LAB — CBC
HCT: 42 % (ref 36.0–46.0)
Hemoglobin: 14.4 g/dL (ref 12.0–15.0)
MCHC: 34.2 g/dL (ref 30.0–36.0)
MCV: 94.4 fl (ref 78.0–100.0)
Platelets: 195 10*3/uL (ref 150.0–400.0)
RBC: 4.45 Mil/uL (ref 3.87–5.11)
RDW: 12.6 % (ref 11.5–15.5)
WBC: 6.6 10*3/uL (ref 4.0–10.5)

## 2022-04-05 LAB — VITAMIN B12: Vitamin B-12: 395 pg/mL (ref 211–911)

## 2022-04-05 LAB — T4, FREE: Free T4: 0.84 ng/dL (ref 0.60–1.60)

## 2022-04-05 LAB — TSH: TSH: 1.41 u[IU]/mL (ref 0.35–5.50)

## 2022-04-05 NOTE — Telephone Encounter (Signed)
Caller Name [name] ?Caller Phone Number [phone] ?Patient Name Tara Rosales ?Patient DOB [birth year] ?Call Type Message Only Information Provided ?Reason for Call Request to Schedule Office Appointment ?Initial Comment Caller states she need a appointment. ?Patient request to speak to RN No ?Disp. Time Disposition Final User ?04/04/2022 6:19:16 PM General Information Provided Yes Clemon Chambers ?Call Closed By: Clemon Chambers ?Transaction Date/Time: 04/04/2022 6:16:18 PM (ET) ?

## 2022-04-05 NOTE — Telephone Encounter (Signed)
Called and scheduled today at 10:45 with PCP. ?For increased brain fog. ?

## 2022-04-05 NOTE — Progress Notes (Signed)
Chief Complaint  ?Patient presents with  ? increase in brain fog  ? ? ?Subjective: ?Patient is a 41 y.o. female here for brain fog. ? ?Over the past month, the patient has been having worsening thinning of her hair/eyelashes, fatigue, central headache, and brain fog/poor memory.  She does have quite a bit of stress but mood has been stable overall.  She is eating healthy overall and exercising routinely.  She denies any injury to her head.  No fevers or recent illness.  She has been compliant with her thyroid medication.  She has a follow-up with her endocrinologist next week.  Of note, she started taking a St. John's wort supplement around 1 month ago, just prior to her symptom onset.  She is not following with a counselor or psychologist. ? ?Past Medical History:  ?Diagnosis Date  ? Abnormal Pap smear of cervix   ? 2004 & 2009  ? Allergy   ? History of chicken pox   ? Hypothyroidism   ? Hypothyroidism 09/19/2016  ? ? ?Objective: ?BP 108/70   Pulse 74   Temp 98.1 ?F (36.7 ?C) (Oral)   Ht 5\' 8"  (1.727 m)   Wt 215 lb 8 oz (97.8 kg)   SpO2 93%   BMI 32.77 kg/m?  ?General: Awake, appears stated age ?Heart: RRR, no LE edema ?Lungs: CTAB, no rales, wheezes or rhonchi. No accessory muscle use ?MSK: No TTP over the cervical paraspinal musculature or suboccipital triangle, temporalis, or TMJ ?Neuro: DTRs equal and symmetric throughout, no clonus, no cerebellar signs, 5/5 strength throughout, gait is normal ?Psych: Age appropriate judgment and insight, normal affect and mood ? ?Assessment and Plan: ?Hypothyroidism, unspecified type ? ?Cold intolerance ? ?Nonintractable headache, unspecified chronicity pattern, unspecified headache type ? ?Memory deficit - Plan: CBC, Comprehensive metabolic panel, TSH, T4, free, B12 ? ?Cont Synthroid and Cytomel.  ?Ck thyroid studies.  ?If labs are neg, will ck CT head. I do want her to stop her St John's Wort supp. ?New problem, uncertain prog. Workup planned. Stop supp. Ck above labs.  CT head if neg and continued s/s's after stopping supplement.  ?F/u pending the above.  ?The patient voiced understanding and agreement to the plan. ? ? , DO ?04/05/22  ?11:14 AM ? ? ? ? ?

## 2022-04-05 NOTE — Patient Instructions (Signed)
Give [place] 2-3 business days to get the results of your labs back.  ? ?Keep the diet clean and stay active. ? ?Please stop the St John's Wort supplement.  ? ?Please consider counseling. Contact [phone] to schedule an appointment or inquire about cost/insurance coverage. ? ?[facility] located at [address], [place], [place], [place].  Phone number = [phone].  Dr. Lennice Sites - Adult Psychiatry.  ?  ?[facility] located at [address] [place], [place]. Phone number = [phone]. ?  ?[facility] located at [address], [place], [place].  Phone number = [phone]. ?  ?[facility] located at [place], [place], [place].  Phone number = [phone]. ? ?Let us know if you need anything. ? ? ?

## 2022-04-13 DIAGNOSIS — R5383 Other fatigue: Secondary | ICD-10-CM | POA: Diagnosis not present

## 2022-04-13 DIAGNOSIS — N921 Excessive and frequent menstruation with irregular cycle: Secondary | ICD-10-CM | POA: Diagnosis not present

## 2022-04-13 DIAGNOSIS — E063 Autoimmune thyroiditis: Secondary | ICD-10-CM | POA: Diagnosis not present

## 2022-04-13 DIAGNOSIS — E038 Other specified hypothyroidism: Secondary | ICD-10-CM | POA: Diagnosis not present

## 2022-04-18 ENCOUNTER — Encounter (INDEPENDENT_AMBULATORY_CARE_PROVIDER_SITE_OTHER): Payer: BC Managed Care – PPO | Admitting: Family Medicine

## 2022-04-18 DIAGNOSIS — R768 Other specified abnormal immunological findings in serum: Secondary | ICD-10-CM | POA: Diagnosis not present

## 2022-04-19 NOTE — Telephone Encounter (Signed)
Please see the MyChart message reply(ies) for my assessment and plan.  The patient gave consent for this Medical Advice Message and is aware that it may result in a bill to their insurance company as well as the possibility that this may result in a co-payment or deductible. They are an established patient, but are not seeking medical advice exclusively about a problem treated during an in person or video visit in the last 7 days. I did not recommend an in person or video visit within 7 days of my reply.  I spent a total of 8 minutes cumulative time within 7 days through MyChart messaging Diora Bellizzi Paul Charbel Los, DO  

## 2022-04-21 DIAGNOSIS — R768 Other specified abnormal immunological findings in serum: Secondary | ICD-10-CM | POA: Diagnosis not present

## 2022-06-22 ENCOUNTER — Ambulatory Visit: Payer: BC Managed Care – PPO | Admitting: Family Medicine

## 2022-06-22 ENCOUNTER — Encounter: Payer: Self-pay | Admitting: Family Medicine

## 2022-06-22 ENCOUNTER — Other Ambulatory Visit (HOSPITAL_BASED_OUTPATIENT_CLINIC_OR_DEPARTMENT_OTHER): Payer: Self-pay

## 2022-06-22 VITALS — BP 120/70 | HR 64 | Temp 98.7°F | Ht 68.0 in | Wt 223.0 lb

## 2022-06-22 DIAGNOSIS — J0100 Acute maxillary sinusitis, unspecified: Secondary | ICD-10-CM

## 2022-06-22 MED ORDER — PREDNISONE 20 MG PO TABS
40.0000 mg | ORAL_TABLET | Freq: Every day | ORAL | 0 refills | Status: AC
Start: 2022-06-22 — End: 2022-06-27
  Filled 2022-06-22: qty 10, 5d supply, fill #0

## 2022-06-22 MED ORDER — DOXYCYCLINE HYCLATE 100 MG PO TABS
100.0000 mg | ORAL_TABLET | Freq: Two times a day (BID) | ORAL | 0 refills | Status: AC
  Filled 2022-06-22: qty 14, 7d supply, fill #0

## 2022-06-22 NOTE — Progress Notes (Signed)
Chief Complaint  Patient presents with   Sinusitis    Send prescription to [facility] for today    Tara Rosales here for URI complaints.  Duration: 1 week  Associated symptoms: sinus congestion, sinus pain, rhinorrhea, itchy watery eyes, dental pain, and coughing Denies: ear pain, ear drainage, sore throat, wheezing, shortness of breath, myalgia, and fevers Treatment to date: Tessalon Perles Sick contacts: No  Past Medical History:  Diagnosis Date   Abnormal Pap smear of cervix    2004 & 2009   Allergy    History of chicken pox    Hypothyroidism    Hypothyroidism 09/19/2016    Objective BP 120/70   Pulse 64   Temp 98.7 F (37.1 C) (Oral)   Ht 5\' 8"  (1.727 m)   Wt 223 lb (101.2 kg)   SpO2 97%   BMI 33.91 kg/m  General: Awake, alert, appears stated age HEENT: AT, [place], ears patent b/l and TM's neg, nares patent w/o discharge, pharynx pink and without exudates, MMM, max sinuses ttp b/l Neck: No masses or asymmetry Heart: RRR Lungs: CTAB, no accessory muscle use Psych: Age appropriate judgment and insight, normal mood and affect  Acute maxillary sinusitis, recurrence not specified - Plan: doxycycline (VIBRA-TABS) 100 MG tablet, predniSONE (DELTASONE) 20 MG tablet  5 d pred burst, 40 mg/d. Doxy if continuing to worsen by tomorrow (3 d in a row). Continue to push fluids, practice good hand hygiene, cover mouth when coughing. F/u prn. If starting to experience fevers, shaking, or shortness of breath, seek immediate care. Pt voiced understanding and agreement to the plan.  [place], DO 06/22/22 11:52 AM

## 2022-06-22 NOTE — Patient Instructions (Signed)
Continue to push fluids, practice good hand hygiene, and cover your mouth if you cough.  If you start having fevers, shaking or shortness of breath, seek immediate care.  OK to take Tylenol 1000 mg (2 extra strength tabs) or 975 mg (3 regular strength tabs) every 6 hours as needed.  Start with the prednisone. If no better in the next 36-48 hours, take the doxycycline.  Let us know if you need anything.

## 2022-08-02 ENCOUNTER — Encounter: Payer: Self-pay | Admitting: General Practice

## 2022-08-02 IMAGING — MG MM DIGITAL SCREENING BILAT W/ TOMO AND CAD
6 of 12 series · 6 of 36 positions shown · non-contrast
Comparison: Previous exam(s).

CLINICAL DATA: Screening.

EXAM:
DIGITAL SCREENING BILATERAL MAMMOGRAM WITH TOMOSYNTHESIS AND CAD
TECHNIQUE: Bilateral screening digital craniocaudal and mediolateral oblique
mammograms were obtained. Bilateral screening digital breast
tomosynthesis was performed. The images were evaluated with
computer-aided detection.

[L MLO synth-2D (1 of 2)]
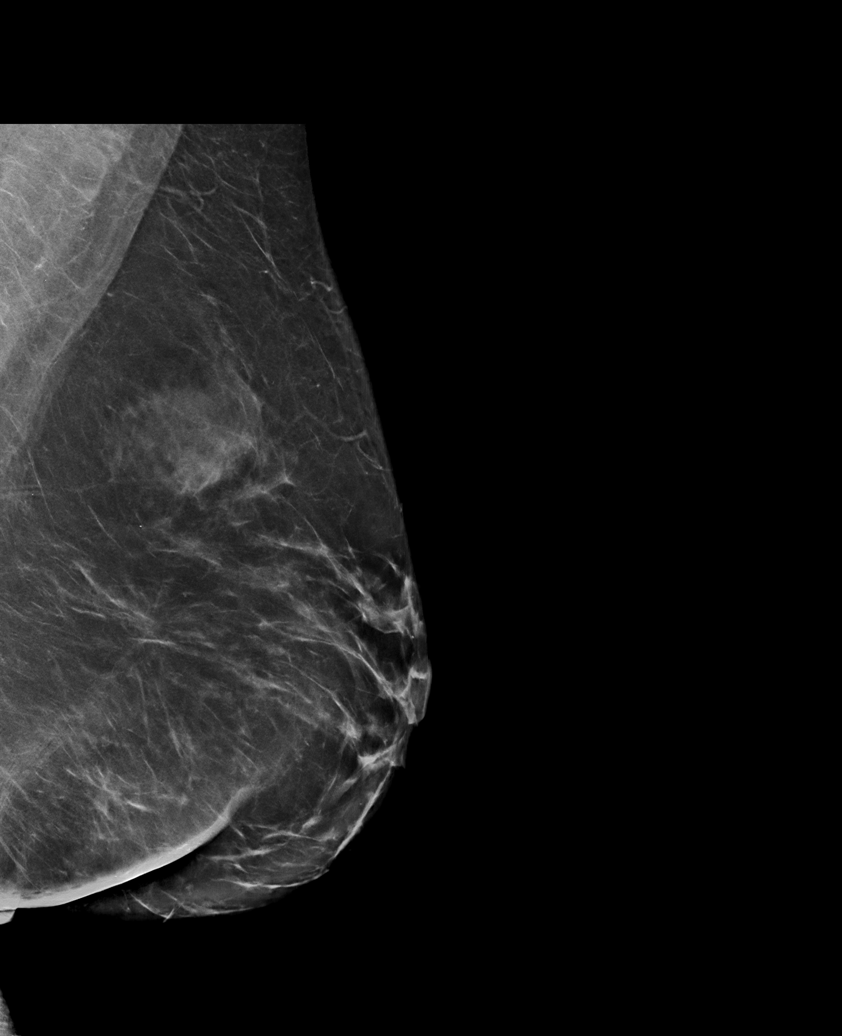

[R CC synth-2D]
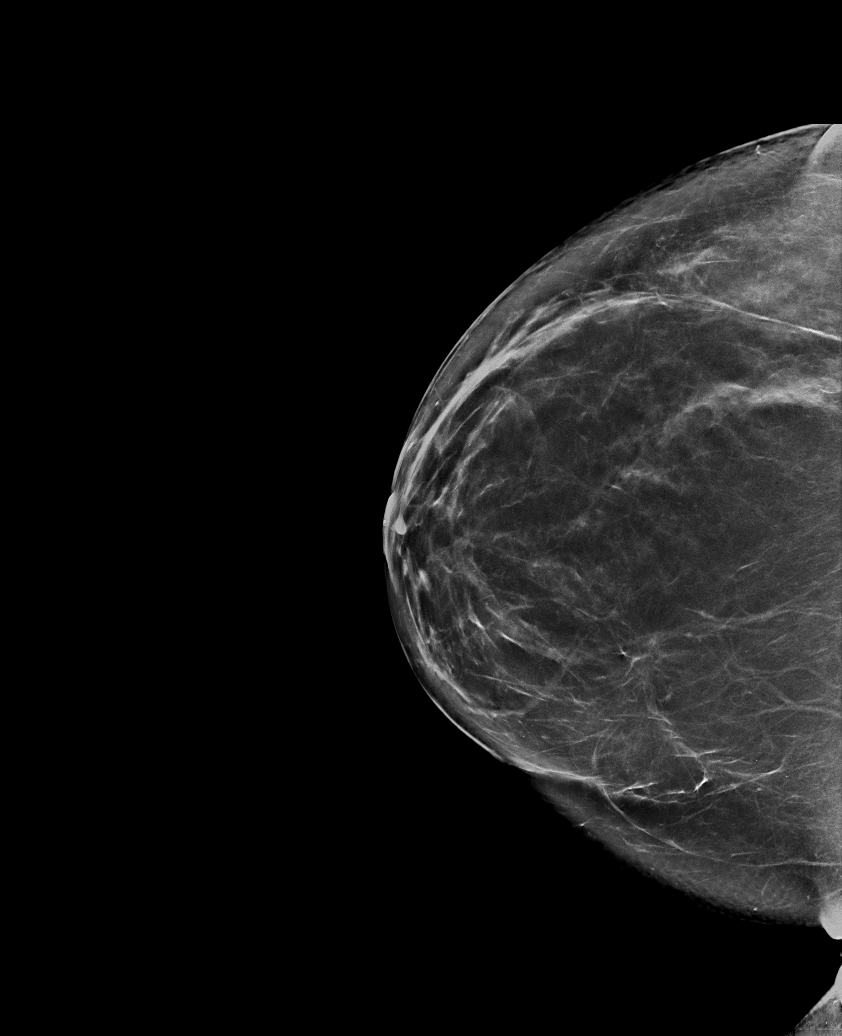

[L MLO synth-2D (2 of 2)]
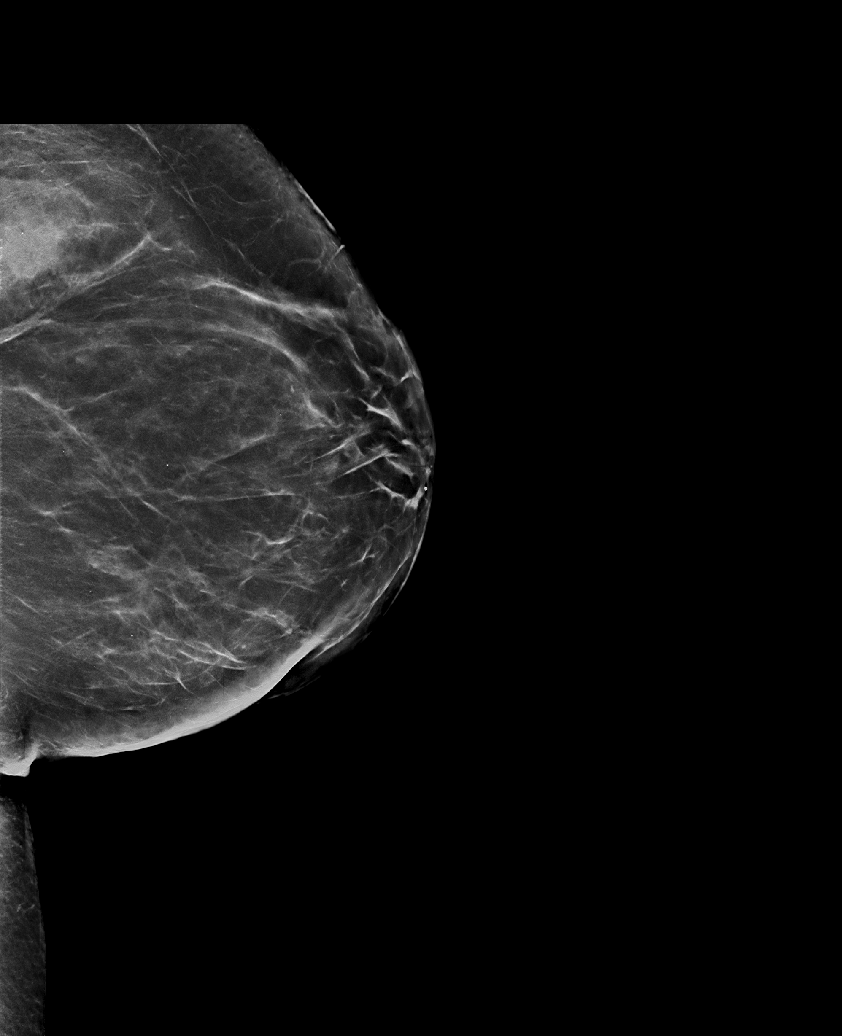

[R MLO synth-2D (1 of 2)]
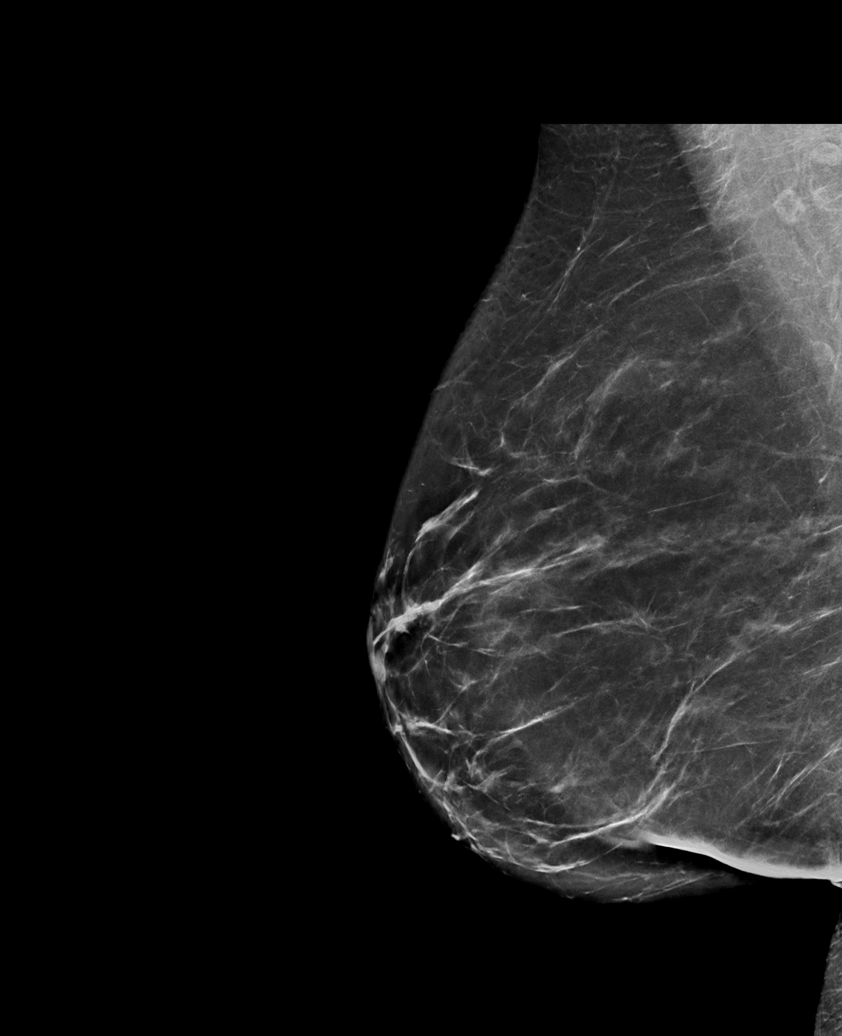

[L CC synth-2D]
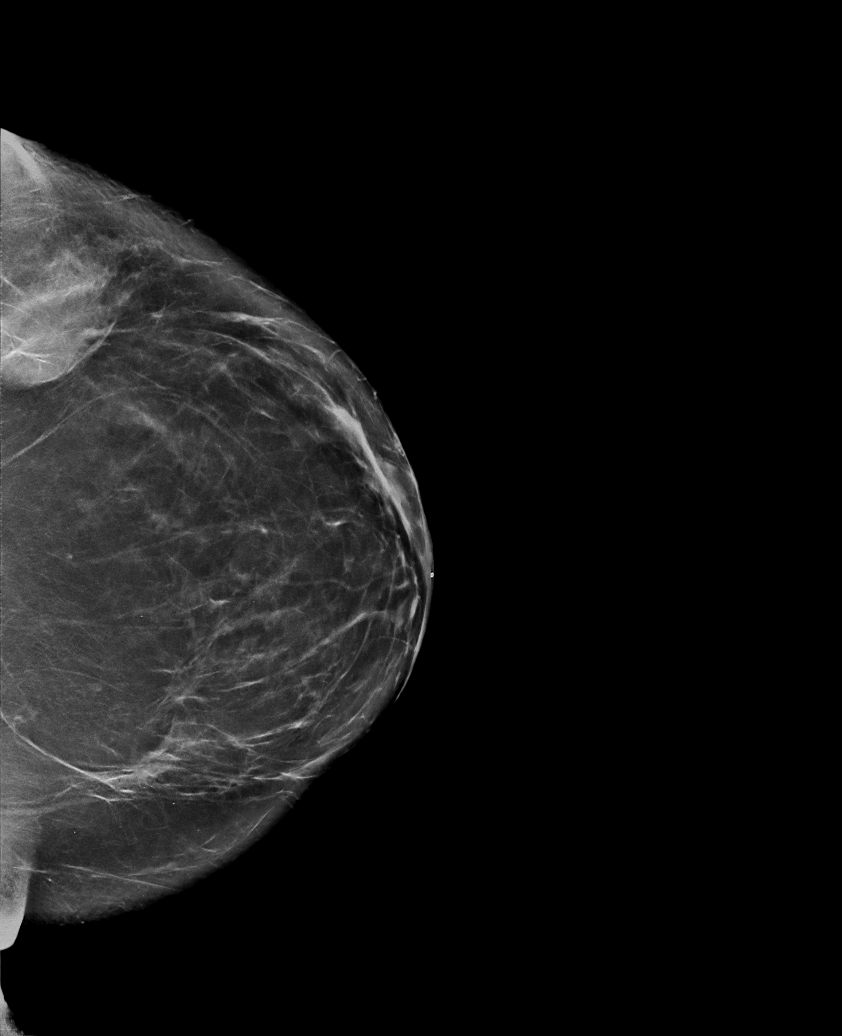

[R MLO synth-2D (2 of 2)]
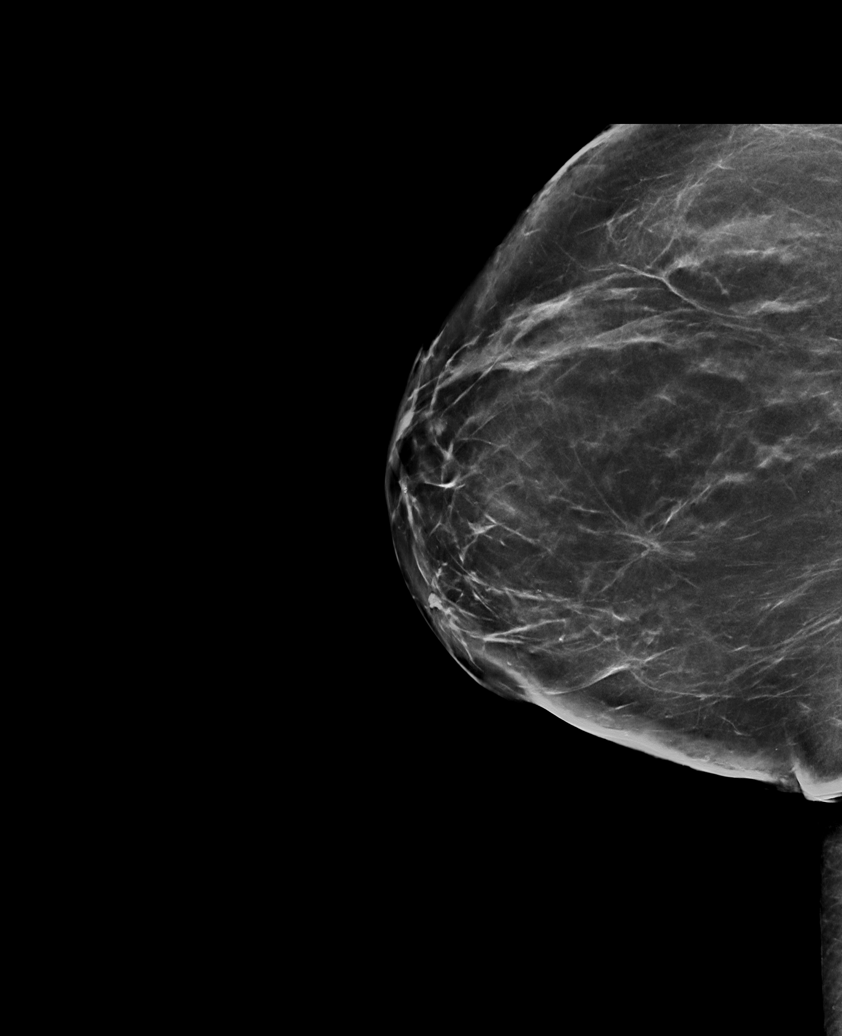

[6 of 36 positions shown; findings below may reference images not displayed]

ACR Breast Density Category b: There are scattered areas of
fibroglandular density.
FINDINGS: There are no findings suspicious for malignancy.
IMPRESSION: No mammographic evidence of malignancy. A result letter of this
screening mammogram will be mailed directly to the patient.

RECOMMENDATION:
Screening mammogram in one year. (Code:51-O-LD2)

BI-RADS CATEGORY  1: Negative.

## 2022-10-03 NOTE — Progress Notes (Signed)
Office Visit Note  Patient: Tara Rosales             Date of Birth: [birth year]           MRN: [id number]             PCP: Shelda Pal, DO Referring: Shelda Pal* Visit Date: 10/14/2022 Occupation: @GUAROCC @  Subjective:  +ANA, fatigue  History of Present Illness: Tara Rosales is a 41 y.o. female seen in consultation at the request of her PCP.  According to the patient her symptoms a started about 5 years ago with episodes of nausea dizziness and brain fog.  She has been also experiencing fatigue for the many years.  She attributed her symptoms to hypothyroidism.  She states in May 2023 her symptoms got worse with increased brain fog to the point that she was having difficulty finding words and increased fatigue.  She states she has also noticed intermittent hair loss with the loss of her eyelashes.  She gives history of photosensitivity for the last 2 years.  She gives history of frequent headaches over the years.  She states that for the last 2 years she has been having increased skin sensitivity and she prefers to not wear jewelry and would prefer to take off clothing because they feel uncomfortable she notices itchy scalp.  She states that she always feels cold despite having several layers of clothing.  No history of oral ulcers, nasal ulcers, malar rash, joint pain or joint swelling.  No family history of autoimmune disease.  Gravida 0, para 0.  No history of DVTs.  Birth control method is abstinence.  Activities of Daily Living:  Patient reports morning stiffness for a few minutes.   Patient Denies nocturnal pain.  Difficulty dressing/grooming: Denies Difficulty climbing stairs: Denies Difficulty getting out of chair: Denies Difficulty using hands for taps, buttons, cutlery, and/or writing: Reports  Review of Systems  Constitutional:  Positive for fatigue.  HENT:  Negative for mouth sores and mouth dryness.   Eyes:  Negative for dryness.   Respiratory:  Negative for shortness of breath.   Cardiovascular:  Negative for chest pain and palpitations.  Gastrointestinal:  Positive for diarrhea. Negative for blood in stool and constipation.  Endocrine: Positive for cold intolerance. Negative for increased urination.  Genitourinary:  Negative for involuntary urination.  Musculoskeletal:  Positive for myalgias, morning stiffness, muscle tenderness and myalgias. Negative for joint pain, gait problem, joint pain, joint swelling and muscle weakness.  Skin:  Positive for rash, hair loss and sensitivity to sunlight. Negative for color change.  Allergic/Immunologic: Positive for susceptible to infections.  Neurological:  Positive for dizziness, light-headedness, numbness and headaches.  Hematological:  Negative for swollen glands.  Psychiatric/Behavioral:  Positive for sleep disturbance. Negative for depressed mood. The patient is nervous/anxious.     PMFS History:  Patient Active Problem List   Diagnosis Date Noted   Hypothyroidism 09/19/2016   Vitamin D deficiency 03/26/2015   Abnormal Pap smear of cervix 07/24/2014    Class: History of   Asthma 05/07/2011    Past Medical History:  Diagnosis Date   Abnormal Pap smear of cervix    2004 & 2009   Allergy    History of chicken pox    Hypothyroidism    Hypothyroidism 09/19/2016    Family History  Problem Relation Age of Onset   Hypertension Mother    Hypertension Father    Cancer Father        prostate  Stroke Father    Cancer Maternal Grandmother        rectal   Diabetes Maternal Grandfather    Breast cancer Paternal Grandmother 57   Heart attack Paternal Grandfather    Past Surgical History:  Procedure Laterality Date   ADENOIDECTOMY     BREAST REDUCTION SURGERY     CERVICAL BIOPSY  W/ LOOP ELECTRODE EXCISION  2004   COLPOSCOPY     2004 & 2009   LYMPH NODE DISSECTION     TONSILLECTOMY     uterine biopsy  10/10/2022   WISDOM TOOTH EXTRACTION  1995   Social  History   Social History Narrative   Not on file   Immunization History  Administered Date(s) Administered   Influenza Split 10/03/2017   Influenza, Quadrivalent, Recombinant, Inj, Pf 09/19/2016   Influenza,inj,Quad PF,6+ Mos 09/07/2015, 10/03/2017, 09/26/2018   Influenza-Unspecified 09/21/2014, 09/07/2015, 09/19/2016, 10/03/2017, 09/26/2018, 09/25/2019, 09/18/2021   Moderna Sars-Covid-2 Vaccination 12/04/2019, 01/01/2020, 10/25/2020   Pfizer Covid-19 Vaccine Bivalent Booster 80yrs & up 08/28/2021   Td 08/10/2016   Tdap 12/05/2005, 08/10/2016   Varicella 03/22/1989     Objective: Vital Signs: BP 132/86 (BP Location: Right Arm, Patient Position: Sitting, Cuff Size: Large)   Pulse 80   Resp 17   Ht 5\' 8"  (1.727 m)   Wt 233 lb 3.2 oz (105.8 kg)   LMP 10/07/2022 (Exact Date)   BMI 35.46 kg/m    Physical Exam Vitals and nursing note reviewed.  Constitutional:      Appearance: She is well-developed.  HENT:     Head: Normocephalic and atraumatic.  Eyes:     Conjunctiva/sclera: Conjunctivae normal.  Cardiovascular:     Rate and Rhythm: Normal rate and regular rhythm.     Heart sounds: Normal heart sounds.  Pulmonary:     Effort: Pulmonary effort is normal.     Breath sounds: Normal breath sounds.  Abdominal:     General: Bowel sounds are normal.     Palpations: Abdomen is soft.  Musculoskeletal:     Cervical back: Normal range of motion.  Lymphadenopathy:     Cervical: No cervical adenopathy.  Skin:    General: Skin is warm and dry.     Capillary Refill: Capillary refill takes less than 2 seconds.  Neurological:     Mental Status: She is alert and oriented to person, place, and time.  Psychiatric:        Behavior: Behavior normal.      Musculoskeletal Exam: Cervical, thoracic and lumbar spine were in good range of motion.  She had no SI joint tenderness.  Shoulder joints, elbow joints, wrist joints, MCPs PIPs and DIPs with good range of motion with no synovitis.   Hip joints, knee joints, ankles, MTPs and PIPs with good range of motion with no synovitis.  CDAI Exam: CDAI Score: -- Patient Global: --; Provider Global: -- Swollen: --; Tender: -- Joint Exam 10/14/2022   No joint exam has been documented for this visit   There is currently no information documented on the homunculus. Go to the Rheumatology activity and complete the homunculus joint exam.  Investigation: No additional findings.  Imaging: No results found.  Recent Labs: Lab Results  Component Value Date   WBC 6.6 04/05/2022   HGB 14.4 04/05/2022   PLT 195.0 04/05/2022   NA 139 04/05/2022   K 4.3 04/05/2022   CL 104 04/05/2022   CO2 26 04/05/2022   GLUCOSE 79 04/05/2022   BUN 13 04/05/2022  CREATININE 1.00 04/05/2022   BILITOT 0.5 04/05/2022   ALKPHOS 48 04/05/2022   AST 17 04/05/2022   ALT 15 04/05/2022   PROT 6.8 04/05/2022   ALBUMIN 4.6 04/05/2022   CALCIUM 9.2 04/05/2022   GFRAA >89 03/23/2015     Speciality Comments: No specialty comments available.  Procedures:  No procedures performed Allergies: Sulfa antibiotics, Elemental sulfur, and Penicillins   Assessment / Plan:     Visit Diagnoses: Positive ANA (antinuclear antibody) -04/15/2022 ANA 1: 320 NS.  04/21/22: Complements WNL, Free T3 3.59, T4 0.8, TSH 1.23, vitamin D 31 -patient gives history of fatigue, brain fog, hair loss, photosensitivity and feeling cold.  There is no history of sicca symptoms, oral ulcers, malar rash, Raynaud's phenomenon, inflammatory arthritis or lymphadenopathy.  She had positive ANA.  I will obtain additional labs today.  Plan: Sedimentation rate, ANA, Anti-scleroderma antibody, RNP Antibody, Anti-Smith antibody, Sjogrens syndrome-A extractable nuclear antibody, Sjogrens syndrome-B extractable nuclear antibody, Anti-DNA antibody, double-stranded, C3 and C4, Beta-2 glycoprotein antibodies, Cardiolipin antibodies, IgG, IgM, IgA, Protein / creatinine ratio, urine  Other fatigue -she  gives history of fatigue for over 5 years.  She states she attributed fatigue to hypothyroidism.  Plan: CBC with Differential/Platelet, COMPLETE METABOLIC PANEL WITH GFR, Serum protein electrophoresis with reflex  Insomnia-patient gives history of sleep disturbance.  Good sleep hygiene was discussed.  Myalgia -she gives history of generalized achiness of her muscles.  She also feels skin sensitivity and difficulty wearing jewelry and tight clothing.  She had no muscular weakness or tenderness on the examination.  She had no difficulty getting up from the squatting position.  Plan: CK.  I discussed possibility of myofascial pain syndrome with history of episodic increased pain and fatigue.  Stretching exercises including yoga discussed.  Need for regular exercise was emphasized.  History of hypothyroidism -she was diagnosed with hypothyroidism several years ago.  She has been taking thyroid medications.  It is possible the positive ANA could be related to thyroid antibodies.  I will check thyroid antibodies today.  Plan: Thyroid peroxidase antibody, Thyroglobulin Level  Vitamin D deficiency-she gives history of vitamin D deficiency.  She has been taking vitamin D.  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  Orders: Orders Placed This Encounter  Procedures   CBC with Differential/Platelet   COMPLETE METABOLIC PANEL WITH GFR   CK   Sedimentation rate   ANA   Anti-scleroderma antibody   RNP Antibody   Anti-Smith antibody   Sjogrens syndrome-A extractable nuclear antibody   Sjogrens syndrome-B extractable nuclear antibody   Anti-DNA antibody, double-stranded   C3 and C4   Beta-2 glycoprotein antibodies   Cardiolipin antibodies, IgG, IgM, IgA   Protein / creatinine ratio, urine   Serum protein electrophoresis with reflex   Thyroid peroxidase antibody   Thyroglobulin Level   No orders of the defined types were placed in this  encounter.    Follow-Up Instructions: Return for +ANA, fatigue.   Pollyann Savoy, MD  Note - This record has been created using Animal nutritionist.  Chart creation errors have been sought, but may not always  have been located. Such creation errors do not reflect on  the standard of medical care.,

## 2022-10-10 ENCOUNTER — Encounter: Payer: Self-pay | Admitting: Obstetrics and Gynecology

## 2022-10-10 ENCOUNTER — Other Ambulatory Visit (HOSPITAL_COMMUNITY)
Admission: RE | Admit: 2022-10-10 | Discharge: 2022-10-10 | Disposition: A | Discharge status: Home / Self Care | Payer: BC Managed Care – PPO | Source: Ambulatory Visit | Attending: Obstetrics and Gynecology | Admitting: Obstetrics and Gynecology

## 2022-10-10 ENCOUNTER — Ambulatory Visit (INDEPENDENT_AMBULATORY_CARE_PROVIDER_SITE_OTHER): Payer: BC Managed Care – PPO | Admitting: Obstetrics and Gynecology

## 2022-10-10 VITALS — BP 114/82 | HR 83 | Ht 68.0 in | Wt 233.0 lb

## 2022-10-10 DIAGNOSIS — N939 Abnormal uterine and vaginal bleeding, unspecified: Secondary | ICD-10-CM | POA: Insufficient documentation

## 2022-10-10 DIAGNOSIS — N710 Acute inflammatory disease of uterus: Secondary | ICD-10-CM | POA: Diagnosis not present

## 2022-10-10 DIAGNOSIS — Z01419 Encounter for gynecological examination (general) (routine) without abnormal findings: Secondary | ICD-10-CM | POA: Diagnosis not present

## 2022-10-10 DIAGNOSIS — N946 Dysmenorrhea, unspecified: Secondary | ICD-10-CM

## 2022-10-10 DIAGNOSIS — N840 Polyp of corpus uteri: Secondary | ICD-10-CM | POA: Diagnosis not present

## 2022-10-10 HISTORY — PX: OTHER SURGICAL HISTORY: SHX169

## 2022-10-10 LAB — POCT URINE PREGNANCY: Preg Test, Ur: NEGATIVE

## 2022-10-10 NOTE — Progress Notes (Signed)
ANNUAL EXAM Patient name: Tara Rosales MRN [id number]  Date of birth: 10-Dec-1980 Chief Complaint:   well woman  History of Present Illness:   Tara Rosales is a 41 y.o. G0P0000 female being seen today for a routine annual exam. She reports doing ok. She has had a pap within the last 3 years done at [facility]. She has questions regarding managing her menses. She last spoke to a GYN about an ablation and was told she was too young. She has no intention of pregnancy in the future.  Her bleeding has always been heavy, usually lasting 2 to 3 days.  In the last year or so she has noticed there has been increased cramping, clots, and pain.  The duration has not changed.  Her menses have always been heavy, that has not changed either.  She is interested in surgical management of her bleeding.  She has tried birth control pills in the past, felt they did not do well for her.  She is not interested in hormonal management at this time.  Patient's last menstrual period was 10/07/2022 (exact date).   Last pap 01/2021. Results were: NILM w/ HRHPV negative. H/O abnormal pap: yes - several years ago  Last mammogram: 02/2022. Results were: normal.      12/10/2021    1:42 PM  Depression screen PHQ 2/9  Decreased Interest 0  Down, Depressed, Hopeless 0  PHQ - 2 Score 0         No data to display           Review of Systems:   Pertinent items are noted in HPI Denies any headaches, blurred vision, fatigue, shortness of breath, chest pain, abdominal pain, abnormal vaginal discharge/itching/odor/irritation, problems with periods, bowel movements, urination, or intercourse unless otherwise stated above. Pertinent History Reviewed:  Reviewed past medical,surgical, social and family history.  Reviewed problem list, medications and allergies. Physical Assessment:   Vitals:   10/10/22 0815  BP: 114/82  Pulse: 83  Weight: 233 lb (105.7 kg)  Height: 5\' 8"  (1.727 m)  Body mass index is 35.43  kg/m.        Physical Examination:   General appearance - well appearing, and in no distress  Mental status - alert, oriented to person, place, and time  Psych:  She has a normal mood and affect  Skin - warm and dry, normal color, no suspicious lesions noted  Chest - effort normal, all lung fields clear to auscultation bilaterally  Heart - normal rate and regular rhythm  Breasts - breasts appear normal, no suspicious masses, no skin or nipple changes or axillary nodes, midline and under breast scars from prior breast reduction  Abdomen - soft, nontender, nondistended, no masses or organomegaly  Pelvic -  VULVA: normal appearing vulva with no masses, tenderness or lesions   VAGINA: normal appearing vagina with normal color and discharge, no lesions CERVIX: normal appearing cervix without discharge or lesions, no CMT  Thin prep pap is not done - not indicte  Extremities:  No swelling or varicosities noted  Chaperone present for exam  No results found for this or any previous visit (from the past 24 hour(s)).   Endometrial Biopsy Procedure  Patient identified, informed consent performed,  indication reviewed, consent signed.  Reviewed risk of perforation, pain, bleeding, insufficient sample, etc were reviewd. Time out was performed.  Urine pregnancy test negative.  Speculum placed in the vagina.  Cervix visualized.  Cleaned with Betadine x 2.  Anterior cervix infiltrated  with 0.5cc of 1%. Grasped anteriorly with a single tooth tenaculum.  Paracervical block was administered and the endocervical canal instilled with lidocaine as well.  Endometrial pipelle was used to draw up 1cc of 1% lidocaine, introduced into the cervical os and instilled into the endometrial cavity.  The pipelle was passed three times without difficulty and sample obtained. Tenaculum was removed, good hemostasis noted.  Patient tolerated procedure well.  Patient was given post-procedure instructions.  Assessment & Plan:   1. Well woman exam with routine gynecological exam Pap due 2025 at earliest assuming still has a cervix at that point - MM 3D SCREEN BREAST BILATERAL; Future  2. Abnormal uterine bleeding (AUB) Now s/p uncomplicated EMB. Pelvic ultrasound ordered. Discussed management options for abnormal uterine bleeding including NSAIDs (Naproxen), tranexamic acid (Lysteda), oral progesterone, Depo Provera, Levonogestrel IUD, endometrial ablation or hysterectomy as definitive surgical management.  Discussed risks and benefits of each method.   Patient desires likely endometrial ablation but would like to think more on it.   - POCT urine pregnancy - US PELVIC COMPLETE WITH TRANSVAGINAL; Future - Surgical pathology  3. Dysmenorrhea Pelvic ultrasound. Currently using combo tylenol/advil with good effect.  - US PELVIC COMPLETE WITH TRANSVAGINAL; Future   Labs/procedures today: EMB   Orders Placed This Encounter  Procedures   MM 3D SCREEN BREAST BILATERAL   US PELVIC COMPLETE WITH TRANSVAGINAL   POCT urine pregnancy    Meds: No orders of the defined types were placed in this encounter.   Follow-up: No follow-ups on file. Pending EMB   Darliss Cheney, MD 10/10/2022 10:41 AM

## 2022-10-11 ENCOUNTER — Encounter: Payer: Self-pay | Admitting: General Practice

## 2022-10-11 LAB — SURGICAL PATHOLOGY

## 2022-10-12 ENCOUNTER — Other Ambulatory Visit: Payer: Self-pay | Admitting: Obstetrics and Gynecology

## 2022-10-12 ENCOUNTER — Ambulatory Visit (HOSPITAL_BASED_OUTPATIENT_CLINIC_OR_DEPARTMENT_OTHER)
Admission: RE | Admit: 2022-10-12 | Discharge: 2022-10-12 | Disposition: A | Discharge status: Home / Self Care | Payer: BC Managed Care – PPO | Source: Ambulatory Visit | Attending: Obstetrics and Gynecology | Admitting: Obstetrics and Gynecology

## 2022-10-12 DIAGNOSIS — N946 Dysmenorrhea, unspecified: Secondary | ICD-10-CM | POA: Diagnosis not present

## 2022-10-12 DIAGNOSIS — N939 Abnormal uterine and vaginal bleeding, unspecified: Secondary | ICD-10-CM | POA: Diagnosis not present

## 2022-10-12 DIAGNOSIS — Z01419 Encounter for gynecological examination (general) (routine) without abnormal findings: Secondary | ICD-10-CM

## 2022-10-14 ENCOUNTER — Ambulatory Visit: Payer: BC Managed Care – PPO | Attending: Rheumatology | Admitting: Rheumatology

## 2022-10-14 ENCOUNTER — Encounter: Payer: Self-pay | Admitting: Rheumatology

## 2022-10-14 ENCOUNTER — Encounter: Payer: Self-pay | Admitting: Obstetrics and Gynecology

## 2022-10-14 VITALS — BP 132/86 | HR 80 | Resp 17 | Ht 68.0 in | Wt 233.2 lb

## 2022-10-14 DIAGNOSIS — R768 Other specified abnormal immunological findings in serum: Secondary | ICD-10-CM

## 2022-10-14 DIAGNOSIS — Z8639 Personal history of other endocrine, nutritional and metabolic disease: Secondary | ICD-10-CM

## 2022-10-14 DIAGNOSIS — R87612 Low grade squamous intraepithelial lesion on cytologic smear of cervix (LGSIL): Secondary | ICD-10-CM

## 2022-10-14 DIAGNOSIS — R5383 Other fatigue: Secondary | ICD-10-CM | POA: Diagnosis not present

## 2022-10-14 DIAGNOSIS — E559 Vitamin D deficiency, unspecified: Secondary | ICD-10-CM

## 2022-10-14 DIAGNOSIS — G4709 Other insomnia: Secondary | ICD-10-CM

## 2022-10-14 DIAGNOSIS — M791 Myalgia, unspecified site: Secondary | ICD-10-CM | POA: Diagnosis not present

## 2022-10-14 DIAGNOSIS — Z8709 Personal history of other diseases of the respiratory system: Secondary | ICD-10-CM

## 2022-10-14 DIAGNOSIS — M7918 Myalgia, other site: Secondary | ICD-10-CM

## 2022-10-14 HISTORY — DX: Myalgia, other site: M79.18

## 2022-10-14 HISTORY — DX: Other specified abnormal immunological findings in serum: R76.8

## 2022-10-19 LAB — COMPLETE METABOLIC PANEL WITH GFR
AG Ratio: 1.9 (calc) (ref 1.0–2.5)
ALT: 13 U/L (ref 6–29)
AST: 16 U/L (ref 10–30)
Albumin: 4.5 g/dL (ref 3.6–5.1)
Alkaline phosphatase (APISO): 73 U/L (ref 31–125)
BUN/Creatinine Ratio: 22 (calc) (ref 6–22)
BUN: 22 mg/dL (ref 7–25)
CO2: 28 mmol/L (ref 20–32)
Calcium: 9.4 mg/dL (ref 8.6–10.2)
Chloride: 106 mmol/L (ref 98–110)
Creat: 1.02 mg/dL — ABNORMAL HIGH (ref 0.50–0.99)
Globulin: 2.4 g/dL (calc) (ref 1.9–3.7)
Glucose, Bld: 84 mg/dL (ref 65–99)
Potassium: 4.5 mmol/L (ref 3.5–5.3)
Sodium: 141 mmol/L (ref 135–146)
Total Bilirubin: 0.4 mg/dL (ref 0.2–1.2)
Total Protein: 6.9 g/dL (ref 6.1–8.1)
eGFR: 70 mL/min/{1.73_m2} (ref >=60)

## 2022-10-19 LAB — ANTI-NUCLEAR AB-TITER (ANA TITER): ANA Titer 1: 1:320 {titer} — ABNORMAL HIGH

## 2022-10-19 LAB — CBC WITH DIFFERENTIAL/PLATELET
Absolute Monocytes: 522 cells/uL (ref 200–950)
Basophils Absolute: 29 cells/uL (ref 0–200)
Basophils Relative: 0.5 %
Eosinophils Absolute: 168 cells/uL (ref 15–500)
Eosinophils Relative: 2.9 %
HCT: 42.2 % (ref 35.0–45.0)
Hemoglobin: 14.5 g/dL (ref 11.7–15.5)
Lymphs Abs: 1247 cells/uL (ref 850–3900)
MCH: 32.1 pg (ref 27.0–33.0)
MCHC: 34.4 g/dL (ref 32.0–36.0)
MCV: 93.4 fL (ref 80.0–100.0)
MPV: 12 fL (ref 7.5–12.5)
Monocytes Relative: 9 %
Neutro Abs: 3834 cells/uL (ref 1500–7800)
Neutrophils Relative %: 66.1 %
Platelets: 207 10*3/uL (ref 140–400)
RBC: 4.52 10*6/uL (ref 3.80–5.10)
RDW: 12.2 % (ref 11.0–15.0)
Total Lymphocyte: 21.5 %
WBC: 5.8 10*3/uL (ref 3.8–10.8)

## 2022-10-19 LAB — BETA-2 GLYCOPROTEIN ANTIBODIES
Beta-2 Glyco 1 IgA: <2 U/mL (ref <20.0)
Beta-2 Glyco 1 IgM: 2 U/mL (ref <20.0)
Beta-2 Glyco I IgG: <2 U/mL (ref <20.0)

## 2022-10-19 LAB — CARDIOLIPIN ANTIBODIES, IGG, IGM, IGA
Anticardiolipin IgA: <2 APL-U/mL (ref <20.0)
Anticardiolipin IgG: <2 GPL-U/mL (ref <20.0)
Anticardiolipin IgM: <2 MPL-U/mL (ref <20.0)

## 2022-10-19 LAB — C3 AND C4
C3 Complement: 127 mg/dL (ref 83–193)
C4 Complement: 22 mg/dL (ref 15–57)

## 2022-10-19 LAB — THYROGLOBULIN LEVEL: Thyroglobulin: 21.9 ng/mL

## 2022-10-19 LAB — PROTEIN ELECTROPHORESIS, SERUM, WITH REFLEX
Albumin ELP: 4.2 g/dL (ref 3.8–4.8)
Alpha 1: 0.3 g/dL (ref 0.2–0.3)
Alpha 2: 0.5 g/dL (ref 0.5–0.9)
Beta 2: 0.3 g/dL (ref 0.2–0.5)
Beta Globulin: 0.4 g/dL (ref 0.4–0.6)
Gamma Globulin: 0.9 g/dL (ref 0.8–1.7)
Total Protein: 6.6 g/dL (ref 6.1–8.1)

## 2022-10-19 LAB — PROTEIN / CREATININE RATIO, URINE
Creatinine, Urine: 78 mg/dL (ref 20–275)
Protein/Creat Ratio: 51 mg/g creat (ref 24–184)
Protein/Creatinine Ratio: 0.051 mg/mg creat (ref 0.024–0.184)
Total Protein, Urine: 4 mg/dL — ABNORMAL LOW (ref 5–24)

## 2022-10-19 LAB — RNP ANTIBODY: Ribonucleic Protein(ENA) Antibody, IgG: <1 AI

## 2022-10-19 LAB — ANTI-SMITH ANTIBODY: ENA SM Ab Ser-aCnc: <1 AI

## 2022-10-19 LAB — SEDIMENTATION RATE: Sed Rate: 2 mm/h (ref 0–20)

## 2022-10-19 LAB — CK: Total CK: 72 U/L (ref 29–143)

## 2022-10-19 LAB — ANA: Anti Nuclear Antibody (ANA): POSITIVE — AB

## 2022-10-19 LAB — SJOGRENS SYNDROME-B EXTRACTABLE NUCLEAR ANTIBODY: SSB (La) (ENA) Antibody, IgG: <1 AI

## 2022-10-19 LAB — SJOGRENS SYNDROME-A EXTRACTABLE NUCLEAR ANTIBODY: SSA (Ro) (ENA) Antibody, IgG: <1 AI

## 2022-10-19 LAB — ANTI-DNA ANTIBODY, DOUBLE-STRANDED: ds DNA Ab: 1 IU/mL

## 2022-10-19 LAB — THYROID PEROXIDASE ANTIBODY: Thyroperoxidase Ab SerPl-aCnc: 6 IU/mL (ref <9)

## 2022-10-19 LAB — ANTI-SCLERODERMA ANTIBODY: Scleroderma (Scl-70) (ENA) Antibody, IgG: <1 AI

## 2022-10-19 NOTE — Progress Notes (Signed)
I will discuss results at the follow-up visit.

## 2022-10-24 ENCOUNTER — Encounter: Payer: Self-pay | Admitting: Obstetrics and Gynecology

## 2022-10-24 ENCOUNTER — Ambulatory Visit (INDEPENDENT_AMBULATORY_CARE_PROVIDER_SITE_OTHER): Payer: BC Managed Care – PPO | Admitting: Obstetrics and Gynecology

## 2022-10-24 ENCOUNTER — Encounter: Payer: Self-pay | Admitting: Family Medicine

## 2022-10-24 ENCOUNTER — Other Ambulatory Visit (HOSPITAL_BASED_OUTPATIENT_CLINIC_OR_DEPARTMENT_OTHER): Payer: Self-pay

## 2022-10-24 VITALS — BP 125/76 | HR 74 | Ht 68.0 in | Wt 233.0 lb

## 2022-10-24 DIAGNOSIS — N946 Dysmenorrhea, unspecified: Secondary | ICD-10-CM

## 2022-10-24 DIAGNOSIS — N939 Abnormal uterine and vaginal bleeding, unspecified: Secondary | ICD-10-CM | POA: Diagnosis not present

## 2022-10-24 MED ORDER — LIOTHYRONINE SODIUM 5 MCG PO TABS
5.0000 ug | ORAL_TABLET | Freq: Two times a day (BID) | ORAL | 2 refills | Status: DC
  Filled 2022-10-24: qty 180, 90d supply, fill #0
  Filled 2023-01-19: qty 180, 90d supply, fill #1
  Filled 2023-04-19: qty 180, 90d supply, fill #2

## 2022-10-24 MED ORDER — LEVOTHYROXINE SODIUM 75 MCG PO TABS
75.0000 ug | ORAL_TABLET | Freq: Every day | ORAL | 2 refills | Status: DC
  Filled 2022-10-24: qty 90, 90d supply, fill #0
  Filled 2023-02-06 – 2023-02-07 (×3): qty 30, 30d supply, fill #1

## 2022-10-24 NOTE — Progress Notes (Signed)
Patient presents for consult for ablation. Patient has heavy painful periods.  Armandina Stammer RN

## 2022-10-24 NOTE — H&P (View-Only) (Signed)
GYNECOLOGY VISIT  Patient name: Tara Rosales MRN [id number]  Date of birth: 08-19-1981 Chief Complaint:   Follow-up  History:  Tara Rosales is a 41 y.o. G0P0000 being seen today for discussion of endometrial ablation. Endometrial biopsy benign. Would like to have done as soon as possible. Wondering if the procedure will help with cramping. She would like to get done in December if able. Understands risk - breast reduction complicated by hemorrhage.   Past Medical History:  Diagnosis Date   Abnormal Pap smear of cervix    2004 & 2009   Allergy    History of chicken pox    Hypothyroidism    Hypothyroidism 09/19/2016    Past Surgical History:  Procedure Laterality Date   ADENOIDECTOMY     BREAST REDUCTION SURGERY     CERVICAL BIOPSY  W/ LOOP ELECTRODE EXCISION  2004   COLPOSCOPY     2004 & 2009   LYMPH NODE DISSECTION     TONSILLECTOMY     uterine biopsy  10/10/2022   WISDOM TOOTH EXTRACTION  1995    The following portions of the patient's history were reviewed and updated as appropriate: allergies, current medications, past family history, past medical history, past social history, past surgical history and problem list.    Review of Systems:  Pertinent items are noted in HPI. Comprehensive review of systems was otherwise negative.   Objective:  Physical Exam BP 125/76   Pulse 74   Ht 5\' 8"  (1.727 m)   Wt 233 lb (105.7 kg)   LMP 10/07/2022 (Exact Date)   BMI 35.43 kg/m    Physical Exam Vitals and nursing note reviewed.  Constitutional:      Appearance: Normal appearance.  HENT:     Head: Normocephalic and atraumatic.  Cardiovascular:     Rate and Rhythm: Normal rate and regular rhythm.  Pulmonary:     Effort: Pulmonary effort is normal.     Breath sounds: Normal breath sounds.  Skin:    General: Skin is warm and dry.  Neurological:     General: No focal deficit present.     Mental Status: She is alert.  Psychiatric:        Mood and Affect:  Mood normal.        Behavior: Behavior normal.        Thought Content: Thought content normal.        Judgment: Judgment normal.      Labs and Imaging 13/02/2022 PELVIS (TRANSABDOMINAL ONLY)  Result Date: 10/16/2022 CLINICAL DATA:  Dysmenorrhea.  Heavy bleeding. EXAM: TRANSABDOMINAL ULTRASOUND OF PELVIS TECHNIQUE: Transabdominal ultrasound examination of the pelvis was performed including evaluation of the uterus, ovaries, adnexal regions, and pelvic cul-de-sac. COMPARISON:  None Available. FINDINGS: Uterus Measurements: 9.9 x 4.0 x 4.1 cm = volume: 97 mL. No fibroids or other mass visualized. Endometrium Thickness: 4.6 mm.  No focal abnormality visualized. Right ovary Measurements: 4.2 x 2.2 x 2.1 cm = volume: 10 mL. Normal appearance/no adnexal mass. Left ovary Measurements: 2.4 x 1.6 x 2.9 cm = volume: 5.8 mL. Normal appearance/no adnexal mass. Other findings:  No abnormal free fluid. IMPRESSION: Normal study. No cause for dysmenorrhea identified. Electronically Signed   By: 13/11/2022 III M.D.   On: 10/16/2022 11:22       Assessment & Plan:   1. Abnormal uterine bleeding (AUB)  Patient desires surgical management with endometrial ablation.  The risks of surgery were discussed in detail with the patient including but  not limited to: bleeding which may require transfusion or reoperation; infection which may require prolonged hospitalization or re-hospitalization and antibiotic therapy; injury to bowel, bladder, ureters and major vessels or other surrounding organs which may lead to other procedures; formation of adhesions; need for additional procedures including laparotomy or subsequent procedures secondary to intraoperative injury or abnormal pathology; thromboembolic phenomenon; incisional problems and other postoperative or anesthesia complications.  Patient was told that the likelihood that her condition and symptoms will be treated effectively with this surgical management was very high; the  postoperative expectations were also discussed in detail. The patient also understands the alternative treatment options which were discussed in full. All questions were answered.  She was told that she will be contacted by our surgical scheduler regarding the time and date of her surgery; routine preoperative instructions will be given to her by the preoperative nursing team.  Printed patient education handouts about the procedure were given to the patient to review at home.   2. Dysmenorrhea Discussed that procedure not indicated for dysmenorrhea, but may help. If persists beyond procedure, may still need medication to address pain.    Routine preventative health maintenance measures emphasized.  Lorriane Shire, MD Minimally Invasive Gynecologic Surgery Center for [facility], [facility]

## 2022-10-24 NOTE — Progress Notes (Signed)
GYNECOLOGY VISIT  Patient name: Tara Rosales MRN [id number]  Date of birth: 1981-04-23 Chief Complaint:   Follow-up  History:  Tara Rosales is a 41 y.o. G0P0000 being seen today for discussion of endometrial ablation. Endometrial biopsy benign. Would like to have done as soon as possible. Wondering if the procedure will help with cramping. She would like to get done in December if able. Understands risk - breast reduction complicated by hemorrhage.   Past Medical History:  Diagnosis Date   Abnormal Pap smear of cervix    2004 & 2009   Allergy    History of chicken pox    Hypothyroidism    Hypothyroidism 09/19/2016    Past Surgical History:  Procedure Laterality Date   ADENOIDECTOMY     BREAST REDUCTION SURGERY     CERVICAL BIOPSY  W/ LOOP ELECTRODE EXCISION  2004   COLPOSCOPY     2004 & 2009   LYMPH NODE DISSECTION     TONSILLECTOMY     uterine biopsy  10/10/2022   WISDOM TOOTH EXTRACTION  1995    The following portions of the patient's history were reviewed and updated as appropriate: allergies, current medications, past family history, past medical history, past social history, past surgical history and problem list.    Review of Systems:  Pertinent items are noted in HPI. Comprehensive review of systems was otherwise negative.   Objective:  Physical Exam BP 125/76   Pulse 74   Ht 5\' 8"  (1.727 m)   Wt 233 lb (105.7 kg)   LMP 10/07/2022 (Exact Date)   BMI 35.43 kg/m    Physical Exam Vitals and nursing note reviewed.  Constitutional:      Appearance: Normal appearance.  HENT:     Head: Normocephalic and atraumatic.  Cardiovascular:     Rate and Rhythm: Normal rate and regular rhythm.  Pulmonary:     Effort: Pulmonary effort is normal.     Breath sounds: Normal breath sounds.  Skin:    General: Skin is warm and dry.  Neurological:     General: No focal deficit present.     Mental Status: She is alert.  Psychiatric:        Mood and Affect:  Mood normal.        Behavior: Behavior normal.        Thought Content: Thought content normal.        Judgment: Judgment normal.      Labs and Imaging 13/02/2022 PELVIS (TRANSABDOMINAL ONLY)  Result Date: 10/16/2022 CLINICAL DATA:  Dysmenorrhea.  Heavy bleeding. EXAM: TRANSABDOMINAL ULTRASOUND OF PELVIS TECHNIQUE: Transabdominal ultrasound examination of the pelvis was performed including evaluation of the uterus, ovaries, adnexal regions, and pelvic cul-de-sac. COMPARISON:  None Available. FINDINGS: Uterus Measurements: 9.9 x 4.0 x 4.1 cm = volume: 97 mL. No fibroids or other mass visualized. Endometrium Thickness: 4.6 mm.  No focal abnormality visualized. Right ovary Measurements: 4.2 x 2.2 x 2.1 cm = volume: 10 mL. Normal appearance/no adnexal mass. Left ovary Measurements: 2.4 x 1.6 x 2.9 cm = volume: 5.8 mL. Normal appearance/no adnexal mass. Other findings:  No abnormal free fluid. IMPRESSION: Normal study. No cause for dysmenorrhea identified. Electronically Signed   By: 13/11/2022 III M.D.   On: 10/16/2022 11:22       Assessment & Plan:   1. Abnormal uterine bleeding (AUB)  Patient desires surgical management with endometrial ablation.  The risks of surgery were discussed in detail with the patient including but  not limited to: bleeding which may require transfusion or reoperation; infection which may require prolonged hospitalization or re-hospitalization and antibiotic therapy; injury to bowel, bladder, ureters and major vessels or other surrounding organs which may lead to other procedures; formation of adhesions; need for additional procedures including laparotomy or subsequent procedures secondary to intraoperative injury or abnormal pathology; thromboembolic phenomenon; incisional problems and other postoperative or anesthesia complications.  Patient was told that the likelihood that her condition and symptoms will be treated effectively with this surgical management was very high; the  postoperative expectations were also discussed in detail. The patient also understands the alternative treatment options which were discussed in full. All questions were answered.  She was told that she will be contacted by our surgical scheduler regarding the time and date of her surgery; routine preoperative instructions will be given to her by the preoperative nursing team.  Printed patient education handouts about the procedure were given to the patient to review at home.   2. Dysmenorrhea Discussed that procedure not indicated for dysmenorrhea, but may help. If persists beyond procedure, may still need medication to address pain.    Routine preventative health maintenance measures emphasized.  Lorriane Shire, MD Minimally Invasive Gynecologic [facility] for [facility], [facility]

## 2022-10-25 ENCOUNTER — Other Ambulatory Visit (HOSPITAL_BASED_OUTPATIENT_CLINIC_OR_DEPARTMENT_OTHER): Payer: Self-pay

## 2022-10-27 NOTE — Progress Notes (Signed)
Office Visit Note  Patient: Tara Rosales             Date of Birth: December 04, 1981           MRN: [id number]             PCP: Shelda Pal, DO Referring: Shelda Pal* Visit Date: 11/10/2022 Occupation: @GUAROCC @  Subjective:  Fatigue and positive ANA  History of Present Illness: Tara Rosales is a 41 y.o. female returns for follow-up visit today for the evaluation of fatigue and positive ANA.  She states she continues to have generalized pain, fatigue and tender points.  She continues to have brain fog.  She complains of a skin sensitivity with wearing clothing and jewelry.  She has no history of oral ulcers, nasal ulcers, malar rash, photosensitivity, Raynaud's phenomenon, lymphadenopathy or joint inflammation.  Activities of Daily Living:  Patient reports morning stiffness for 1 hour.   Patient Denies nocturnal pain.  Difficulty dressing/grooming: Denies Difficulty climbing stairs: Denies Difficulty getting out of chair: Denies Difficulty using hands for taps, buttons, cutlery, and/or writing: Denies  Review of Systems  Constitutional:  Positive for fatigue.  HENT:  Negative for mouth sores and mouth dryness.   Eyes:  Negative for dryness.  Respiratory:  Negative for shortness of breath.   Cardiovascular:  Negative for chest pain and palpitations.  Gastrointestinal:  Negative for blood in stool, constipation and diarrhea.  Endocrine: Negative for increased urination.  Genitourinary:  Negative for involuntary urination.  Musculoskeletal:  Positive for joint pain, joint pain, myalgias, morning stiffness, muscle tenderness and myalgias. Negative for gait problem, joint swelling and muscle weakness.  Skin:  Positive for hair loss. Negative for color change, rash and sensitivity to sunlight.  Allergic/Immunologic: Negative for susceptible to infections.  Neurological:  Positive for dizziness and headaches.  Hematological:  Negative for swollen glands.   Psychiatric/Behavioral:  Negative for depressed mood and sleep disturbance. The patient is not nervous/anxious.     PMFS History:  Patient Active Problem List   Diagnosis Date Noted   Hypothyroidism 09/19/2016   Vitamin D deficiency 03/26/2015   Abnormal Pap smear of cervix 07/24/2014    Class: History of   Asthma 05/07/2011    Past Medical History:  Diagnosis Date   Abnormal Pap smear of cervix    2004 & 2009   Allergy    History of chicken pox    Hypothyroidism    Hypothyroidism 09/19/2016    Family History  Problem Relation Age of Onset   Hypertension Mother    Hypertension Father    Cancer Father        prostate   Stroke Father    Cancer Maternal Grandmother        rectal   Diabetes Maternal Grandfather    Breast cancer Paternal Grandmother 1   Heart attack Paternal Grandfather    Past Surgical History:  Procedure Laterality Date   ADENOIDECTOMY     BREAST REDUCTION SURGERY     CERVICAL BIOPSY  W/ LOOP ELECTRODE EXCISION  2004   COLPOSCOPY     2004 & 2009   LYMPH NODE DISSECTION     TONSILLECTOMY     uterine biopsy  10/10/2022   WISDOM TOOTH EXTRACTION  1995   Social History   Social History Narrative   Not on file   Immunization History  Administered Date(s) Administered   Influenza Split 10/03/2017   Influenza, Quadrivalent, Recombinant, Inj, Pf 09/19/2016   Influenza,inj,Quad PF,6+ Mos 09/07/2015,  10/03/2017, 09/26/2018   Influenza-Unspecified 09/21/2014, 09/07/2015, 09/19/2016, 10/03/2017, 09/26/2018, 09/25/2019, 09/18/2021   Moderna Sars-Covid-2 Vaccination 12/04/2019, 01/01/2020, 10/25/2020   Pfizer Covid-19 Vaccine Bivalent Booster 36yrs & up 08/28/2021   Td 08/10/2016   Tdap 12/05/2005, 08/10/2016   Varicella 03/22/1989     Objective: Vital Signs: BP 124/84 (BP Location: Left Arm, Patient Position: Sitting, Cuff Size: Normal)   Pulse 81   Resp 16   Ht 5\' 8"  (1.727 m)   Wt 234 lb (106.1 kg)   BMI 35.58 kg/m    Physical  Exam Vitals and nursing note reviewed.  Constitutional:      Appearance: She is well-developed.  HENT:     Head: Normocephalic and atraumatic.  Eyes:     Conjunctiva/sclera: Conjunctivae normal.  Cardiovascular:     Rate and Rhythm: Normal rate and regular rhythm.     Heart sounds: Normal heart sounds.  Pulmonary:     Effort: Pulmonary effort is normal.     Breath sounds: Normal breath sounds.  Abdominal:     General: Bowel sounds are normal.     Palpations: Abdomen is soft.  Musculoskeletal:     Cervical back: Normal range of motion.  Lymphadenopathy:     Cervical: No cervical adenopathy.  Skin:    General: Skin is warm and dry.     Capillary Refill: Capillary refill takes less than 2 seconds.  Neurological:     Mental Status: She is alert and oriented to person, place, and time.  Psychiatric:        Behavior: Behavior normal.      Musculoskeletal Exam: Cervical, thoracic and lumbar spine with good range of motion.  Shoulder joints, elbow joints, wrist joints, MCPs PIPs and DIPs with good range of motion with no synovitis.  Hip joints, knee joints, ankles, MTPs and PIPs with good range of motion with no synovitis.  She has generalized hyperalgesia and positive tender points.  CDAI Exam: CDAI Score: -- Patient Global: --; Provider Global: -- Swollen: --; Tender: -- Joint Exam 11/10/2022   No joint exam has been documented for this visit   There is currently no information documented on the homunculus. Go to the Rheumatology activity and complete the homunculus joint exam.  Investigation: No additional findings.  Imaging: 14/06/2022 PELVIS (TRANSABDOMINAL ONLY)  Result Date: 10/16/2022 CLINICAL DATA:  Dysmenorrhea.  Heavy bleeding. EXAM: TRANSABDOMINAL ULTRASOUND OF PELVIS TECHNIQUE: Transabdominal ultrasound examination of the pelvis was performed including evaluation of the uterus, ovaries, adnexal regions, and pelvic cul-de-sac. COMPARISON:  None Available. FINDINGS:  Uterus Measurements: 9.9 x 4.0 x 4.1 cm = volume: 97 mL. No fibroids or other mass visualized. Endometrium Thickness: 4.6 mm.  No focal abnormality visualized. Right ovary Measurements: 4.2 x 2.2 x 2.1 cm = volume: 10 mL. Normal appearance/no adnexal mass. Left ovary Measurements: 2.4 x 1.6 x 2.9 cm = volume: 5.8 mL. Normal appearance/no adnexal mass. Other findings:  No abnormal free fluid. IMPRESSION: Normal study. No cause for dysmenorrhea identified. Electronically Signed   By: 13/11/2022 III M.D.   On: 10/16/2022 11:22    Recent Labs: Lab Results  Component Value Date   WBC 5.8 10/14/2022   HGB 14.5 10/14/2022   PLT 207 10/14/2022   NA 141 10/14/2022   K 4.5 10/14/2022   CL 106 10/14/2022   CO2 28 10/14/2022   GLUCOSE 84 10/14/2022   BUN 22 10/14/2022   CREATININE 1.02 (H) 10/14/2022   BILITOT 0.4 10/14/2022   ALKPHOS 48 04/05/2022  AST 16 10/14/2022   ALT 13 10/14/2022   PROT 6.9 10/14/2022   PROT 6.6 10/14/2022   ALBUMIN 4.6 04/05/2022   CALCIUM 9.4 10/14/2022   GFRAA >89 03/23/2015   October 14, 2022 SPEP normal, protein creatinine ratio normal, ANA 1: 320 NS, ENA (SCL 70, RNP, Smith, SSA, SSB, dsDNA) negative, C3-C4 normal, anticardiolipin negative, beta-2 GP 1 negative, CK 72, sed rate 2, TPO antibody negative, thyroglobulin negative   Speciality Comments: No specialty comments available.  Procedures:  No procedures performed Allergies: Sulfa antibiotics, Elemental sulfur, and Penicillins   Assessment / Plan:     Visit Diagnoses: Positive ANA (antinuclear antibody) - Positive ANA, ENA negative, complements normal.  Patient gives no history of oral ulcers, nasal ulcers, malar rash, sicca symptoms, Raynaud's, lymphadenopathy, or inflammatory arthritis.  She continues to have generalized arthralgias and myalgias.  Lab results were discussed with the patient at length.  She was also advised to contact me if she develops any new symptoms.   I would see her back in 1  year .  Myofascial pain syndrome -she gives history of generalized pain, positive tender points.  CK normal.  She had positive tender points on the examination today.  Detail concerning myofascial pain syndrome was provided.  She will benefit from  aerobics and stretching exercises.  Benefits of swimming, water aerobics and yoga were discussed.  I will also refer her to physical therapy.  I also discussed possible use of Cymbalta in the future.  I would try to stay away from the medications if possible.  Avoidance of sugar, processed food was also discussed.  Other fatigue - Most likely related to insomnia and myofascial pain.  She gives history of brain fog fatigue and hair loss.  If brain fog continues then she should see a neurologist.  Other insomnia - History of chronic insomnia.  Good sleep hygiene was discussed.  Elevated serum creatinine-patient did his intake of NSAIDs.  Her creatinine is mildly elevated.  She has a physical coming up in January.  I advised her to increase water intake and have repeat labs with Dr. Nani Ravens in January.  Elevated blood pressure reading-blood pressure reading was elevated at 169/78.  Repeat blood pressure reading was 124/84.  Patient was advised to monitor blood pressure and follow-up with her PCP.  History of hypothyroidism  Vitamin D deficiency - On vitamin D supplement.  She may get repeat vitamin D level with her physical.  Her vitamin D was 30 in January 2023.  Low grade squamous intraepithelial lesion on cytologic smear of cervix (LGSIL)  Orders: No orders of the defined types were placed in this encounter.  No orders of the defined types were placed in this encounter.    Follow-Up Instructions: Return in about 1 year (around 11/11/2023) for +ANA.   Bo Merino, MD  Note - This record has been created using Editor, commissioning.  Chart creation errors have been sought, but may not always  have been located. Such creation errors do not  reflect on  the standard of medical care.

## 2022-11-03 ENCOUNTER — Encounter: Payer: Self-pay | Admitting: Obstetrics and Gynecology

## 2022-11-10 ENCOUNTER — Encounter: Payer: Self-pay | Admitting: Rheumatology

## 2022-11-10 ENCOUNTER — Ambulatory Visit: Payer: BC Managed Care – PPO | Attending: Rheumatology | Admitting: Rheumatology

## 2022-11-10 VITALS — BP 124/84 | HR 81 | Resp 16 | Ht 68.0 in | Wt 234.0 lb

## 2022-11-10 DIAGNOSIS — G4709 Other insomnia: Secondary | ICD-10-CM | POA: Diagnosis not present

## 2022-11-10 DIAGNOSIS — R5383 Other fatigue: Secondary | ICD-10-CM | POA: Diagnosis not present

## 2022-11-10 DIAGNOSIS — E559 Vitamin D deficiency, unspecified: Secondary | ICD-10-CM

## 2022-11-10 DIAGNOSIS — Z8639 Personal history of other endocrine, nutritional and metabolic disease: Secondary | ICD-10-CM

## 2022-11-10 DIAGNOSIS — R768 Other specified abnormal immunological findings in serum: Secondary | ICD-10-CM | POA: Diagnosis not present

## 2022-11-10 DIAGNOSIS — R87612 Low grade squamous intraepithelial lesion on cytologic smear of cervix (LGSIL): Secondary | ICD-10-CM

## 2022-11-10 DIAGNOSIS — M7918 Myalgia, other site: Secondary | ICD-10-CM

## 2022-11-10 DIAGNOSIS — R7989 Other specified abnormal findings of blood chemistry: Secondary | ICD-10-CM

## 2022-11-10 DIAGNOSIS — R030 Elevated blood-pressure reading, without diagnosis of hypertension: Secondary | ICD-10-CM

## 2022-11-10 NOTE — Addendum Note (Signed)
Addended by: Ellen Henri on: 11/10/2022 09:54 AM   Modules accepted: Orders

## 2022-11-15 ENCOUNTER — Encounter (HOSPITAL_BASED_OUTPATIENT_CLINIC_OR_DEPARTMENT_OTHER): Payer: Self-pay | Admitting: Obstetrics and Gynecology

## 2022-11-15 NOTE — Anesthesia Preprocedure Evaluation (Signed)
Anesthesia Evaluation  Patient identified by MRN, date of birth, ID band Patient awake    Reviewed: Allergy & Precautions, NPO status , Patient's Chart, lab work & pertinent test results  Airway Mallampati: II  TM Distance: >3 FB Neck ROM: Full    Dental no notable dental hx. (+) Teeth Intact   Pulmonary asthma    Pulmonary exam normal breath sounds clear to auscultation       Cardiovascular Exercise Tolerance: Good Normal cardiovascular exam Rhythm:Regular Rate:Normal     Neuro/Psych  Neuromuscular disease    GI/Hepatic negative GI ROS, Neg liver ROS,,,  Endo/Other  Hypothyroidism    Renal/GU Lab Results      Component                Value               Date                      CREATININE               1.02 (H)            10/14/2022                BUN                      22                  10/14/2022                NA                       141                 10/14/2022                K                        4.5                 10/14/2022                CL                       106                 10/14/2022                CO2                      28                  10/14/2022                Musculoskeletal negative musculoskeletal ROS (+)    Abdominal  (+) + obese (BMI 35.6)  Peds  Hematology Lab Results      Component                Value               Date                      WBC                      5.8  10/14/2022                HGB                      14.5                10/14/2022                HCT                      42.2                10/14/2022                MCV                      93.4                10/14/2022                PLT                      207                 10/14/2022              Anesthesia Other Findings   Reproductive/Obstetrics negative OB ROS                             Anesthesia Physical Anesthesia Plan  ASA: 2  Anesthesia  Plan: General   Post-op Pain Management:    Induction: Intravenous  PONV Risk Score and Plan: Treatment may vary due to age or medical condition, Midazolam, Dexamethasone and Ondansetron  Airway Management Planned: LMA  Additional Equipment: None  Intra-op Plan:   Post-operative Plan:   Informed Consent: I have reviewed the patients History and Physical, chart, labs and discussed the procedure including the risks, benefits and alternatives for the proposed anesthesia with the patient or authorized representative who has indicated his/her understanding and acceptance.     Dental advisory given  Plan Discussed with: CRNA, Anesthesiologist and Surgeon  Anesthesia Plan Comments:        Anesthesia Quick Evaluation

## 2022-11-15 NOTE — Progress Notes (Signed)
Spoke w/ via phone for pre-op interview--- pt Lab needs dos----   urine preg (per anes)            Lab results------ no COVID test -----patient states asymptomatic no test needed Arrive at ------- 0745 on 11-16-2022 NPO after MN NO Solid Food.  Clear liquids from MN until--- 0645 Med rec completed Medications to take morning of surgery ----- synthroid, cytomel Diabetic medication ----- n/a Patient instructed no nail polish to be worn day of surgery Patient instructed to bring photo id and insurance card day of surgery Patient aware to have Driver (ride ) / caregiver    for 24 hours after surgery -- mother, deborah Patient Special Instructions ----- n/a Pre-Op special Istructions ----- sent inbox message in epic to dr Briscoe Deutscher, requested orders Patient verbalized understanding of instructions that were given at this phone interview. Patient denies shortness of breath, chest pain, fever, cough at this phone interview.

## 2022-11-16 ENCOUNTER — Ambulatory Visit (HOSPITAL_BASED_OUTPATIENT_CLINIC_OR_DEPARTMENT_OTHER): Payer: BC Managed Care – PPO | Admitting: Obstetrics and Gynecology

## 2022-11-16 ENCOUNTER — Ambulatory Visit (HOSPITAL_BASED_OUTPATIENT_CLINIC_OR_DEPARTMENT_OTHER)
Admission: RE | Admit: 2022-11-16 | Discharge: 2022-11-16 | Disposition: A | Discharge status: Home / Self Care | Payer: BC Managed Care – PPO | Source: Ambulatory Visit | Attending: Obstetrics and Gynecology | Admitting: Obstetrics and Gynecology

## 2022-11-16 ENCOUNTER — Other Ambulatory Visit (HOSPITAL_BASED_OUTPATIENT_CLINIC_OR_DEPARTMENT_OTHER): Payer: Self-pay

## 2022-11-16 ENCOUNTER — Other Ambulatory Visit: Payer: Self-pay

## 2022-11-16 ENCOUNTER — Encounter (HOSPITAL_BASED_OUTPATIENT_CLINIC_OR_DEPARTMENT_OTHER): Payer: Self-pay | Admitting: Obstetrics and Gynecology

## 2022-11-16 ENCOUNTER — Encounter (HOSPITAL_BASED_OUTPATIENT_CLINIC_OR_DEPARTMENT_OTHER)
Admission: RE | Disposition: A | Discharge status: Home / Self Care | Payer: Self-pay | Source: Ambulatory Visit | Attending: Obstetrics and Gynecology

## 2022-11-16 DIAGNOSIS — Z01818 Encounter for other preprocedural examination: Secondary | ICD-10-CM

## 2022-11-16 DIAGNOSIS — N946 Dysmenorrhea, unspecified: Secondary | ICD-10-CM | POA: Insufficient documentation

## 2022-11-16 DIAGNOSIS — N840 Polyp of corpus uteri: Secondary | ICD-10-CM | POA: Diagnosis not present

## 2022-11-16 DIAGNOSIS — N939 Abnormal uterine and vaginal bleeding, unspecified: Secondary | ICD-10-CM | POA: Insufficient documentation

## 2022-11-16 HISTORY — DX: Abnormal uterine and vaginal bleeding, unspecified: N93.9

## 2022-11-16 HISTORY — DX: Personal history of cervical dysplasia: Z87.410

## 2022-11-16 HISTORY — DX: Presence of spectacles and contact lenses: Z97.3

## 2022-11-16 HISTORY — PX: DILITATION & CURRETTAGE/HYSTROSCOPY WITH NOVASURE ABLATION: SHX5568

## 2022-11-16 HISTORY — DX: Allergic rhinitis, unspecified: J30.9

## 2022-11-16 LAB — POCT PREGNANCY, URINE: Preg Test, Ur: NEGATIVE

## 2022-11-16 LAB — TYPE AND SCREEN
ABO/RH(D): B NEG
Antibody Screen: NEGATIVE

## 2022-11-16 SURGERY — DILATATION & CURETTAGE/HYSTEROSCOPY WITH NOVASURE ABLATION
Anesthesia: General | Site: Vagina

## 2022-11-16 MED ORDER — ONDANSETRON HCL 4 MG/2ML IJ SOLN
INTRAMUSCULAR | Status: AC
  Filled 2022-11-16: qty 2

## 2022-11-16 MED ORDER — MIDAZOLAM HCL 5 MG/5ML IJ SOLN
INTRAMUSCULAR | Status: DC | PRN
  Administered 2022-11-16: 2 mg via INTRAVENOUS

## 2022-11-16 MED ORDER — HYDROMORPHONE HCL 1 MG/ML IJ SOLN
0.2500 mg | INTRAMUSCULAR | Status: DC | PRN
  Administered 2022-11-16: 0.25 mg via INTRAVENOUS

## 2022-11-16 MED ORDER — ONDANSETRON HCL 4 MG/2ML IJ SOLN: 4.0000 mg | Freq: Once | INTRAMUSCULAR | Status: DC | PRN

## 2022-11-16 MED ORDER — PROPOFOL 10 MG/ML IV BOLUS
INTRAVENOUS | Status: AC
  Filled 2022-11-16: qty 20

## 2022-11-16 MED ORDER — FENTANYL CITRATE (PF) 100 MCG/2ML IJ SOLN
INTRAMUSCULAR | Status: AC
  Filled 2022-11-16: qty 2

## 2022-11-16 MED ORDER — ACETAMINOPHEN 500 MG PO TABS
500.0000 mg | ORAL_TABLET | Freq: Four times a day (QID) | ORAL | 0 refills | Status: DC
  Filled 2022-11-16: qty 100, 25d supply, fill #0

## 2022-11-16 MED ORDER — LIDOCAINE 2% (20 MG/ML) 5 ML SYRINGE
INTRAMUSCULAR | Status: DC | PRN
  Administered 2022-11-16: 100 mg via INTRAVENOUS

## 2022-11-16 MED ORDER — SODIUM CHLORIDE 0.9 % IR SOLN
Status: DC | PRN
  Administered 2022-11-16: 3000 mL

## 2022-11-16 MED ORDER — ONDANSETRON HCL 4 MG/2ML IJ SOLN
INTRAMUSCULAR | Status: DC | PRN
  Administered 2022-11-16: 4 mg via INTRAVENOUS

## 2022-11-16 MED ORDER — DEXAMETHASONE SODIUM PHOSPHATE 10 MG/ML IJ SOLN
INTRAMUSCULAR | Status: DC | PRN
  Administered 2022-11-16: 5 mg via INTRAVENOUS

## 2022-11-16 MED ORDER — KETOROLAC TROMETHAMINE 30 MG/ML IJ SOLN
INTRAMUSCULAR | Status: DC | PRN
  Administered 2022-11-16: 30 mg via INTRAVENOUS

## 2022-11-16 MED ORDER — IBUPROFEN 600 MG PO TABS
600.0000 mg | ORAL_TABLET | Freq: Four times a day (QID) | ORAL | 3 refills | Status: DC
  Filled 2022-11-16: qty 60, 15d supply, fill #0

## 2022-11-16 MED ORDER — ACETAMINOPHEN 500 MG PO TABS
1000.0000 mg | ORAL_TABLET | Freq: Once | ORAL | Status: AC
  Administered 2022-11-16: 1000 mg via ORAL

## 2022-11-16 MED ORDER — DEXMEDETOMIDINE HCL IN NACL 200 MCG/50ML IV SOLN
INTRAVENOUS | Status: DC | PRN
  Administered 2022-11-16: 12 ug via INTRAVENOUS

## 2022-11-16 MED ORDER — PROPOFOL 10 MG/ML IV BOLUS
INTRAVENOUS | Status: DC | PRN
  Administered 2022-11-16: 20 mg via INTRAVENOUS
  Administered 2022-11-16: 180 mg via INTRAVENOUS

## 2022-11-16 MED ORDER — HYDROMORPHONE HCL 1 MG/ML IJ SOLN
INTRAMUSCULAR | Status: AC
  Filled 2022-11-16: qty 1

## 2022-11-16 MED ORDER — FENTANYL CITRATE (PF) 100 MCG/2ML IJ SOLN
INTRAMUSCULAR | Status: DC | PRN
  Administered 2022-11-16 (×2): 50 ug via INTRAVENOUS
  Administered 2022-11-16: 25 ug via INTRAVENOUS

## 2022-11-16 MED ORDER — MIDAZOLAM HCL 2 MG/2ML IJ SOLN
INTRAMUSCULAR | Status: AC
  Filled 2022-11-16: qty 2

## 2022-11-16 MED ORDER — BUPIVACAINE HCL (PF) 0.5 % IJ SOLN
INTRAMUSCULAR | Status: DC | PRN
  Administered 2022-11-16: 10 mL

## 2022-11-16 MED ORDER — OXYCODONE HCL 5 MG PO TABS: 5.0000 mg | ORAL_TABLET | Freq: Once | ORAL | Status: DC | PRN

## 2022-11-16 MED ORDER — LACTATED RINGERS IV SOLN: INTRAVENOUS | Status: DC

## 2022-11-16 MED ORDER — LIDOCAINE HCL (PF) 2 % IJ SOLN
INTRAMUSCULAR | Status: AC
  Filled 2022-11-16: qty 5

## 2022-11-16 MED ORDER — DEXAMETHASONE SODIUM PHOSPHATE 10 MG/ML IJ SOLN
INTRAMUSCULAR | Status: AC
  Filled 2022-11-16: qty 1

## 2022-11-16 MED ORDER — KETOROLAC TROMETHAMINE 30 MG/ML IJ SOLN: 30.0000 mg | Freq: Once | INTRAMUSCULAR | Status: DC | PRN

## 2022-11-16 MED ORDER — OXYCODONE HCL 5 MG/5ML PO SOLN: 5.0000 mg | Freq: Once | ORAL | Status: DC | PRN

## 2022-11-16 MED ORDER — POVIDONE-IODINE 10 % EX SWAB: 2.0000 | Freq: Once | CUTANEOUS | Status: DC

## 2022-11-16 MED ORDER — ACETAMINOPHEN 500 MG PO TABS
ORAL_TABLET | ORAL | Status: AC
  Filled 2022-11-16: qty 2

## 2022-11-16 SURGICAL SUPPLY — 21 items
ABLATOR SURESOUND NOVASURE (ABLATOR) IMPLANT
CATH ROBINSON RED A/P 16FR (CATHETERS) ×1 IMPLANT
DEVICE MYOSURE LITE (MISCELLANEOUS) IMPLANT
DEVICE MYOSURE REACH (MISCELLANEOUS) IMPLANT
DRSG TELFA 3X8 NADH STRL (GAUZE/BANDAGES/DRESSINGS) ×1 IMPLANT
GAUZE 4X4 16PLY ~~LOC~~+RFID DBL (SPONGE) ×1 IMPLANT
GLOVE BIO SURGEON STRL SZ7 (GLOVE) ×1 IMPLANT
GLOVE BIOGEL PI IND STRL 7.0 (GLOVE) IMPLANT
GOWN STRL REUS W/TWL LRG LVL3 (GOWN DISPOSABLE) IMPLANT
GOWN STRL REUS W/TWL XL LVL3 (GOWN DISPOSABLE) ×1 IMPLANT
IV NS IRRIG 3000ML ARTHROMATIC (IV SOLUTION) IMPLANT
KIT PROCEDURE FLUENT (KITS) ×1 IMPLANT
KIT TURNOVER CYSTO (KITS) ×1 IMPLANT
LOOP CUTTING BIPOLAR 21FR (ELECTRODE) IMPLANT
MYOSURE XL FIBROID (MISCELLANEOUS)
PACK VAGINAL MINOR WOMEN LF (CUSTOM PROCEDURE TRAY) ×1 IMPLANT
PAD OB MATERNITY 4.3X12.25 (PERSONAL CARE ITEMS) ×1 IMPLANT
SEAL CERVICAL OMNI LOK (ABLATOR) IMPLANT
SEAL ROD LENS SCOPE MYOSURE (ABLATOR) ×1 IMPLANT
SOL PREP POV-IOD 4OZ 10% (MISCELLANEOUS) IMPLANT
SYSTEM TISS REMOVAL MYOSURE XL (MISCELLANEOUS) IMPLANT

## 2022-11-16 NOTE — Progress Notes (Signed)
Next dose of Tylenol 91800) written on discharged instructions and informed patient and her mother. Verbalized understanding.

## 2022-11-16 NOTE — Interval H&P Note (Signed)
History and Physical Interval Note:  11/16/2022 8:20 AM  Tara Rosales  has presented today for surgery, with the diagnosis of AUB.  The various methods of treatment have been discussed with the patient and family. After consideration of risks, benefits and other options for treatment, the patient has consented to  Procedure(s): DILATATION & CURETTAGE/HYSTEROSCOPY WITH NOVASURE ABLATION (N/A) as a surgical intervention.  The patient's history has been reviewed, patient examined, no change in status, stable for surgery.  I have reviewed the patient's chart and labs.  Questions were answered to the patient's satisfaction.     Joeleen Wortley

## 2022-11-16 NOTE — Brief Op Note (Signed)
11/16/2022  10:55 AM  PATIENT:  Tara Rosales  41 y.o. female  PRE-OPERATIVE DIAGNOSIS:  AUB  POST-OPERATIVE DIAGNOSIS:  AUB  PROCEDURE:  Procedure(s): DILATATION & CURETTAGE/HYSTEROSCOPY WITH NOVASURE ABLATION (N/A)  SURGEON:  Surgeon(s) and Role:    Lorriane Shire, MD - Primary  PHYSICIAN ASSISTANT:  none  ASSISTANTS: none   ANESTHESIA:   general  EBL:  10 mL   BLOOD ADMINISTERED:none  DRAINS: none   LOCAL MEDICATIONS USED:  BUPIVICAINE   SPECIMEN:  Source of Specimen:  endometrial polyp and curettings  DISPOSITION OF SPECIMEN:  PATHOLOGY  COUNTS:  YES  TOURNIQUET:  * No tourniquets in log *  DICTATION: .Note written in EPIC  PLAN OF CARE: Discharge to home after PACU  PATIENT DISPOSITION:  PACU - hemodynamically stable.   Delay start of Pharmacological VTE agent (>24hrs) due to surgical blood loss or risk of bleeding: not applicable

## 2022-11-16 NOTE — Discharge Instructions (Addendum)
Take ibuprofen and tylenol every 6 hours for the first 24 hours.       No ibuprofen, Advil, Aleve, Motrin, ketorolac, meloxicam, naproxen, or other NSAIDS until after 4:45 pm today if needed.   Post Anesthesia Home Care Instructions  Activity: Get plenty of rest for the remainder of the day. A responsible individual must stay with you for 24 hours following the procedure.  For the next 24 hours, DO NOT: -Drive a car -Advertising copywriter -Drink alcoholic beverages -Take any medication unless instructed by your physician -Make any legal decisions or sign important papers.  Meals: Start with liquid foods such as gelatin or soup. Progress to regular foods as tolerated. Avoid greasy, spicy, heavy foods. If nausea and/or vomiting occur, drink only clear liquids until the nausea and/or vomiting subsides. Call your physician if vomiting continues.  Special Instructions/Symptoms: Your throat may feel dry or sore from the anesthesia or the breathing tube placed in your throat during surgery. If this causes discomfort, gargle with warm salt water. The discomfort should disappear within 24 hours.

## 2022-11-16 NOTE — Op Note (Signed)
PREOPERATIVE DIAGNOSIS:  Irregular uterine bleeding POSTOPERATIVE DIAGNOSIS: The same PROCEDURE:  Hysteroscopy,  Dilation and Curettage, NovaSure Endometrial Ablation SURGEON:  Dr. Lorriane Shire   INDICATIONS: 41 y.o. G0P0000  here for NovaSure Endometrial Ablation.  Risks of surgery were discussed with the patient including but not limited to: bleeding which may require transfusion; infection which may require antibiotics; injury to uterus leading to risk of injury to surrounding intraperitoneal organs, need for additional procedures including laparoscopy or laparotomy, and other postoperative/anesthesia complications. Written informed consent was obtained.   FINDINGS:  A 8 week size uterus.  Diffuse proliferative endometrium. Small polyp on posterior   Normal ostia bilaterally.  ANESTHESIA:   General and paracervical block with 30 ml of 0.5% Marcaine INTRAVENOUS FLUIDS:  775 ml of LR FLUID DEFICITS:  of normal saline ESTIMATED BLOOD LOSS:  Less than 20 ml SPECIMENS: Endometrial curettings with polyp sent to pathology COMPLICATIONS:  None immediate  PROCEDURE DETAILS:  The patient was taken to the operating room where general anesthesia was administered and was found to be adequate.  After an adequate timeout was performed, she was placed in the dorsal lithotomy position and examined; then prepped and draped in the sterile manner.   Her bladder was catheterized for an unmeasured amount of clear, yellow urine. A speculum was then placed in the patient's vagina and a single tooth tenaculum was applied to the anterior lip of the cervix.  A paracervical block using 0.5% Marcaine was administered.  The cervix was dilated manually with Pratt dilators to accommodate the diagnostic hysteroscope.  Once the cervix was dilated, the hysteroscope was inserted under direct visualization.  The above findings were noted and decision made to proceed with polypectomy with Myosure lite device. The  hysteroscope was removed and a curettage was done to obtain some endometrial curettings. The uterine cavity was measured using the SureSound and the cavity measured to 4.5cm  The cervix was further dilated to accommodate the NovaSure device.  The NovaSure device was inserted, and a cavity width of 3.5 cm was determined. Using a power of 87 watts, for 1:20 sec, the endometrial ablation was performed. The hysteroscope was then re-introduced into the uterine cavity, confirming complete ablation of the endometrium. The tenaculum was removed from the anterior lip of the cervix, and the vaginal speculum was removed after noting good hemostasis.  The patient tolerated the procedure well and was taken to the recovery area awake, extubated and in stable condition.  The patient will be discharged to home as per PACU criteria.  Routine postoperative instructions given.  She was prescribed acetaminophen and ibuprofen.  She will follow up in the clinic  for postoperative evaluation.

## 2022-11-16 NOTE — Anesthesia Postprocedure Evaluation (Signed)
Anesthesia Post Note  Patient: Tara Rosales  Procedure(s) Performed: DILATATION & CURETTAGE/HYSTEROSCOPY WITH NOVASURE ABLATION (Vagina )     Patient location during evaluation: PACU Anesthesia Type: General Level of consciousness: awake and alert Pain management: pain level controlled Vital Signs Assessment: post-procedure vital signs reviewed and stable Respiratory status: spontaneous breathing, nonlabored ventilation, respiratory function stable and patient connected to nasal cannula oxygen Cardiovascular status: blood pressure returned to baseline and stable Postop Assessment: no apparent nausea or vomiting Anesthetic complications: no  No notable events documented.  Last Vitals:  Vitals:   11/16/22 1115 11/16/22 1205  BP: 128/83 133/79  Pulse: 72 71  Resp: 18 16  Temp:  36.8 C  SpO2: 100% 100%    Last Pain:  Vitals:   11/16/22 1205  TempSrc: Oral  PainSc: 3                  Trevor Iha

## 2022-11-16 NOTE — Transfer of Care (Signed)
Immediate Anesthesia Transfer of Care Note  Patient: Tara Rosales  Procedure(s) Performed: DILATATION & CURETTAGE/HYSTEROSCOPY WITH NOVASURE ABLATION (Vagina )  Patient Location: PACU  Anesthesia Type:General  Level of Consciousness: awake, alert , oriented, and patient cooperative  Airway & Oxygen Therapy: Patient Spontanous Breathing  Post-op Assessment: Report given to RN and Post -op Vital signs reviewed and stable  Post vital signs: Reviewed and stable  Last Vitals:  Vitals Value Taken Time  BP 123/97 11/16/22 1052  Temp    Pulse 99 11/16/22 1054  Resp 14 11/16/22 1054  SpO2 100 % 11/16/22 1054  Vitals shown include unvalidated device data.  Last Pain:  Vitals:   11/16/22 0750  TempSrc: Oral  PainSc: 0-No pain      Patients Stated Pain Goal: 5 (11/16/22 0750)  Complications: No notable events documented.

## 2022-11-17 ENCOUNTER — Encounter (HOSPITAL_BASED_OUTPATIENT_CLINIC_OR_DEPARTMENT_OTHER): Payer: Self-pay | Admitting: Obstetrics and Gynecology

## 2022-11-17 ENCOUNTER — Encounter: Payer: Self-pay | Admitting: Obstetrics and Gynecology

## 2022-11-17 LAB — SURGICAL PATHOLOGY

## 2022-11-21 DIAGNOSIS — R2689 Other abnormalities of gait and mobility: Secondary | ICD-10-CM | POA: Diagnosis not present

## 2022-11-21 DIAGNOSIS — M5459 Other low back pain: Secondary | ICD-10-CM | POA: Diagnosis not present

## 2022-11-21 DIAGNOSIS — M542 Cervicalgia: Secondary | ICD-10-CM | POA: Diagnosis not present

## 2022-11-21 DIAGNOSIS — R293 Abnormal posture: Secondary | ICD-10-CM | POA: Diagnosis not present

## 2022-11-22 DIAGNOSIS — M542 Cervicalgia: Secondary | ICD-10-CM | POA: Diagnosis not present

## 2022-11-22 DIAGNOSIS — R2689 Other abnormalities of gait and mobility: Secondary | ICD-10-CM | POA: Diagnosis not present

## 2022-11-22 DIAGNOSIS — R293 Abnormal posture: Secondary | ICD-10-CM | POA: Diagnosis not present

## 2022-11-22 DIAGNOSIS — M5459 Other low back pain: Secondary | ICD-10-CM | POA: Diagnosis not present

## 2022-11-23 DIAGNOSIS — R293 Abnormal posture: Secondary | ICD-10-CM | POA: Diagnosis not present

## 2022-11-23 DIAGNOSIS — M5459 Other low back pain: Secondary | ICD-10-CM | POA: Diagnosis not present

## 2022-11-23 DIAGNOSIS — R2689 Other abnormalities of gait and mobility: Secondary | ICD-10-CM | POA: Diagnosis not present

## 2022-11-23 DIAGNOSIS — M542 Cervicalgia: Secondary | ICD-10-CM | POA: Diagnosis not present

## 2022-12-14 DIAGNOSIS — R293 Abnormal posture: Secondary | ICD-10-CM | POA: Diagnosis not present

## 2022-12-14 DIAGNOSIS — M542 Cervicalgia: Secondary | ICD-10-CM | POA: Diagnosis not present

## 2022-12-14 DIAGNOSIS — R2689 Other abnormalities of gait and mobility: Secondary | ICD-10-CM | POA: Diagnosis not present

## 2022-12-14 DIAGNOSIS — M5459 Other low back pain: Secondary | ICD-10-CM | POA: Diagnosis not present

## 2022-12-16 ENCOUNTER — Telehealth (INDEPENDENT_AMBULATORY_CARE_PROVIDER_SITE_OTHER): Payer: BC Managed Care – PPO | Admitting: Obstetrics and Gynecology

## 2022-12-16 ENCOUNTER — Encounter: Payer: Self-pay | Admitting: Obstetrics and Gynecology

## 2022-12-16 DIAGNOSIS — Z09 Encounter for follow-up examination after completed treatment for conditions other than malignant neoplasm: Secondary | ICD-10-CM

## 2022-12-16 DIAGNOSIS — N939 Abnormal uterine and vaginal bleeding, unspecified: Secondary | ICD-10-CM

## 2022-12-16 NOTE — Progress Notes (Signed)
Patient complaining of cramping and spotting that started yesterday. Patient states that she has begun with "heavy spotting"

## 2022-12-16 NOTE — Progress Notes (Signed)
   POSTOPERATIVE VIRTUAL VISIT NOTE   Subjective:     Idara Woodside is a 42 y.o. [id number] who presents to the clinic weeks status post  endometrial ablation  for abnormal uterine bleeding on 11/16/22. Eating a regular diet without difficulty. Bowel movements are normal. The patient is not having any pain. Started having some spotted yesterday and cramping overnight, has not checked pad this AM to see if anymore bleeding.   The following portions of the patient's history were reviewed and updated as appropriate: allergies, current medications, past family history, past medical history, past social history, past surgical history, and problem list.. Last pap on 01/2021 was negative, negative HPV.  Review of Systems Pertinent items are noted in HPI.    Objective:    LMP 11/02/2022 (Exact Date)  General:  alert, cooperative, and no distress          Pathology Results: FINAL MICROSCOPIC DIAGNOSIS:   A. ENDOMETRIUM, POLYP, CURETTAGE:  - Benign polypoid secretory endometrium  - Negative for hyperplasia or malignancy    Assessment:   Doing well postoperatively. Operative findings again reviewed. Pathology report discussed.   1. Postop check Doing well  2. Abnormal uterine bleeding S/p endometrial ablation   Plan:   1. Continue any current medications. 2. Monitor bleeding and cramping  3. Activity restrictions: none 4. Anticipated return to work: not applicable. 5. Follow up as needed   Darliss Cheney, MD [place], [facility] for [employer], [place]

## 2022-12-21 DIAGNOSIS — M5459 Other low back pain: Secondary | ICD-10-CM | POA: Diagnosis not present

## 2022-12-21 DIAGNOSIS — M542 Cervicalgia: Secondary | ICD-10-CM | POA: Diagnosis not present

## 2022-12-21 DIAGNOSIS — R293 Abnormal posture: Secondary | ICD-10-CM | POA: Diagnosis not present

## 2022-12-21 DIAGNOSIS — R2689 Other abnormalities of gait and mobility: Secondary | ICD-10-CM | POA: Diagnosis not present

## 2022-12-28 DIAGNOSIS — R2689 Other abnormalities of gait and mobility: Secondary | ICD-10-CM | POA: Diagnosis not present

## 2022-12-28 DIAGNOSIS — R293 Abnormal posture: Secondary | ICD-10-CM | POA: Diagnosis not present

## 2022-12-28 DIAGNOSIS — M542 Cervicalgia: Secondary | ICD-10-CM | POA: Diagnosis not present

## 2022-12-28 DIAGNOSIS — M5459 Other low back pain: Secondary | ICD-10-CM | POA: Diagnosis not present

## 2022-12-29 DIAGNOSIS — F411 Generalized anxiety disorder: Secondary | ICD-10-CM | POA: Diagnosis not present

## 2023-01-04 DIAGNOSIS — M5459 Other low back pain: Secondary | ICD-10-CM | POA: Diagnosis not present

## 2023-01-04 DIAGNOSIS — R293 Abnormal posture: Secondary | ICD-10-CM | POA: Diagnosis not present

## 2023-01-04 DIAGNOSIS — R2689 Other abnormalities of gait and mobility: Secondary | ICD-10-CM | POA: Diagnosis not present

## 2023-01-04 DIAGNOSIS — M542 Cervicalgia: Secondary | ICD-10-CM | POA: Diagnosis not present

## 2023-01-10 DIAGNOSIS — F411 Generalized anxiety disorder: Secondary | ICD-10-CM | POA: Diagnosis not present

## 2023-01-11 DIAGNOSIS — M5459 Other low back pain: Secondary | ICD-10-CM | POA: Diagnosis not present

## 2023-01-11 DIAGNOSIS — R2689 Other abnormalities of gait and mobility: Secondary | ICD-10-CM | POA: Diagnosis not present

## 2023-01-11 DIAGNOSIS — R293 Abnormal posture: Secondary | ICD-10-CM | POA: Diagnosis not present

## 2023-01-11 DIAGNOSIS — M542 Cervicalgia: Secondary | ICD-10-CM | POA: Diagnosis not present

## 2023-01-24 DIAGNOSIS — F411 Generalized anxiety disorder: Secondary | ICD-10-CM | POA: Diagnosis not present

## 2023-01-25 DIAGNOSIS — R293 Abnormal posture: Secondary | ICD-10-CM | POA: Diagnosis not present

## 2023-01-25 DIAGNOSIS — M5459 Other low back pain: Secondary | ICD-10-CM | POA: Diagnosis not present

## 2023-01-25 DIAGNOSIS — R2689 Other abnormalities of gait and mobility: Secondary | ICD-10-CM | POA: Diagnosis not present

## 2023-01-25 DIAGNOSIS — M542 Cervicalgia: Secondary | ICD-10-CM | POA: Diagnosis not present

## 2023-01-27 ENCOUNTER — Other Ambulatory Visit: Payer: Self-pay | Admitting: Family Medicine

## 2023-01-27 ENCOUNTER — Ambulatory Visit (INDEPENDENT_AMBULATORY_CARE_PROVIDER_SITE_OTHER): Payer: BC Managed Care – PPO | Admitting: Family Medicine

## 2023-01-27 ENCOUNTER — Encounter: Payer: Self-pay | Admitting: Family Medicine

## 2023-01-27 VITALS — BP 118/81 | HR 76 | Temp 98.3°F | Ht 68.0 in | Wt 237.0 lb

## 2023-01-27 DIAGNOSIS — E559 Vitamin D deficiency, unspecified: Secondary | ICD-10-CM | POA: Diagnosis not present

## 2023-01-27 DIAGNOSIS — Z0000 Encounter for general adult medical examination without abnormal findings: Secondary | ICD-10-CM | POA: Diagnosis not present

## 2023-01-27 LAB — COMPREHENSIVE METABOLIC PANEL
ALT: 18 U/L (ref 0–35)
AST: 17 U/L (ref 0–37)
Albumin: 4.4 g/dL (ref 3.5–5.2)
Alkaline Phosphatase: 61 U/L (ref 39–117)
BUN: 8 mg/dL (ref 6–23)
CO2: 27 mEq/L (ref 19–32)
Calcium: 9.9 mg/dL (ref 8.4–10.5)
Chloride: 105 mEq/L (ref 96–112)
Creatinine, Ser: 0.67 mg/dL (ref 0.40–1.20)
GFR: 106.76 mL/min (ref >60.00)
Glucose, Bld: 81 mg/dL (ref 70–99)
Potassium: 4.5 mEq/L (ref 3.5–5.1)
Sodium: 141 mEq/L (ref 135–145)
Total Bilirubin: 0.7 mg/dL (ref 0.2–1.2)
Total Protein: 6.7 g/dL (ref 6.0–8.3)

## 2023-01-27 LAB — CBC
HCT: 42.1 % (ref 36.0–46.0)
Hemoglobin: 14.5 g/dL (ref 12.0–15.0)
MCHC: 34.5 g/dL (ref 30.0–36.0)
MCV: 93.4 fl (ref 78.0–100.0)
Platelets: 194 10*3/uL (ref 150.0–400.0)
RBC: 4.51 Mil/uL (ref 3.87–5.11)
RDW: 12.2 % (ref 11.5–15.5)
WBC: 5.9 10*3/uL (ref 4.0–10.5)

## 2023-01-27 LAB — LIPID PANEL
Cholesterol: 176 mg/dL (ref 0–200)
HDL: 66.8 mg/dL (ref >39.00)
LDL Cholesterol: 96 mg/dL (ref 0–99)
NonHDL: 109.44
Total CHOL/HDL Ratio: 3
Triglycerides: 66 mg/dL (ref 0.0–149.0)
VLDL: 13.2 mg/dL (ref 0.0–40.0)

## 2023-01-27 LAB — VITAMIN D 25 HYDROXY (VIT D DEFICIENCY, FRACTURES): VITD: 27.22 ng/mL — ABNORMAL LOW (ref 30.00–100.00)

## 2023-01-27 LAB — TSH: TSH: 1.36 u[IU]/mL (ref 0.35–5.50)

## 2023-01-27 LAB — T4, FREE: Free T4: 0.87 ng/dL (ref 0.60–1.60)

## 2023-01-27 NOTE — Progress Notes (Signed)
Chief Complaint  Patient presents with   Annual Exam     Well Woman Tara Rosales is here for a complete physical.   Her last physical was >1 year ago.  Current diet: in general, a "healthy" diet. Current exercise: water aerobics. Weight is stable and she denies new fatigue out of ordinary. Seatbelt? Yes Advanced directive? No  Health Maintenance Pap/HPV- Yes Mammogram- Yes Tetanus- Yes Hep C screening- Yes HIV screening- Yes  Past Medical History:  Diagnosis Date   Abnormal uterine bleeding (AUB)    Allergic rhinitis    History of cervical dysplasia    s/p  LEEP  in 2004    in gyn office   Hypothyroidism due to Hashimoto's thyroiditis 09/19/2016   pcp and endocrinologist-- Isabell Jarvis NP   Myofascial pain syndrome 10/14/2022   Positive ANA (antinuclear antibody) 10/14/2022   work-up done by rheumologist--- dr s. Estanislado Pandy   Wears glasses      Past Surgical History:  Procedure Laterality Date   BREAST REDUCTION SURGERY Bilateral 1999   CERVICAL BIOPSY  W/ LOOP ELECTRODE EXCISION  2004   DILITATION & CURRETTAGE/HYSTROSCOPY WITH NOVASURE ABLATION N/A 11/16/2022   Procedure: DILATATION & CURETTAGE/HYSTEROSCOPY WITH NOVASURE ABLATION;  Surgeon: Darliss Cheney, MD;  Location: [place];  Service: Gynecology;  Laterality: N/A;   LYMPH NODE DISSECTION  2001   per pt under chin for enlargement,  benign   TONSILLECTOMY AND ADENOIDECTOMY  1992   child   [place]    Medications  Current Outpatient Medications on File Prior to Visit  Medication Sig Dispense Refill   cetirizine (ZYRTEC) 10 MG tablet Take 10 mg by mouth at bedtime.     Cholecalciferol (VITAMIN D-3 PO) Take 1 capsule by mouth daily.     levothyroxine (SYNTHROID) 75 MCG tablet Take 1 tablet (75 mcg total) by mouth daily. (Patient taking differently: Take 75 mcg by mouth daily.) 90 tablet 2   liothyronine (CYTOMEL) 5 MCG tablet Take 1 tablet (5 mcg total) by mouth 2  (two) times daily. (Patient taking differently: Take 5 mcg by mouth 2 (two) times daily.) 180 tablet 2   Allergies Allergies  Allergen Reactions   Sulfa Antibiotics Hives    Intense Hives   Penicillins Hives   Eggs Or Egg-Derived Products Diarrhea, Nausea And Vomiting and Rash   Gluten Meal Diarrhea, Nausea And Vomiting and Rash   Other Diarrhea, Nausea And Vomiting and Rash    Dairy products   Peanut-Containing Drug Products Diarrhea, Nausea And Vomiting and Rash    Per pt only peanuts,  tree nuts okay   Soy Allergy Diarrhea, Nausea And Vomiting and Rash    Review of Systems: Constitutional:  no unexpected weight changes Eye:  no recent significant change in vision Ear/Nose/Mouth/Throat:  Ears:  no recent change in hearing Nose/Mouth/Throat:  no complaints of nasal congestion, no sore throat Cardiovascular: no chest pain Respiratory:  no shortness of breath Gastrointestinal:  no abdominal pain, no change in bowel habits GU:  Female: negative for dysuria or pelvic pain Musculoskeletal/Extremities:  no new pain of the joints Integumentary (Skin/Breast):  no abnormal skin lesions reported Neurologic:  no headaches Endocrine:  denies fatigue Hematologic/Lymphatic:  No areas of easy bleeding  Exam BP 118/81 (BP Location: Left Arm, Patient Position: Sitting, Cuff Size: Large)   Pulse 76   Temp 98.3 F (36.8 C) (Oral)   Ht '5\' 8"'$  (1.727 m)   Wt 237 lb (107.5 kg)  SpO2 99%   BMI 36.04 kg/m  General:  well developed, well nourished, in no apparent distress Skin:  no significant moles, warts, or growths Head:  no masses, lesions, or tenderness Eyes:  pupils equal and round, sclera anicteric without injection Ears:  canals without lesions, TMs shiny without retraction, no obvious effusion, no erythema Nose:  nares patent, mucosa normal, and no drainage Throat/Pharynx:  lips and gingiva without lesion; tongue and uvula midline; non-inflamed pharynx; no exudates or postnasal  drainage Neck: neck supple without adenopathy, thyromegaly, or masses Lungs:  clear to auscultation, breath sounds equal bilaterally, no respiratory distress Cardio:  regular rate and rhythm, no LE edema Abdomen:  abdomen soft, nontender; bowel sounds normal; no masses or organomegaly Genital: Defer to GYN Musculoskeletal:  symmetrical muscle groups noted without atrophy or deformity Extremities:  no clubbing, cyanosis, or edema, no deformities, no skin discoloration Neuro:  gait normal; deep tendon reflexes normal and symmetric Psych: well oriented with normal range of affect and appropriate judgment/insight  Assessment and Plan  Well adult exam - Plan: CBC, Comprehensive metabolic panel, T4, free, TSH, Lipid panel  Vitamin D deficiency - Plan: VITAMIN D 25 Hydroxy (Vit-D Deficiency, Fractures)   Well 42 y.o. female. Counseled on diet and exercise. Advanced directive form provided today.  Other orders as above. Follow up 6 mo. The patient voiced understanding and agreement to the plan.  [place], DO 01/27/23 10:33 AM

## 2023-01-27 NOTE — Patient Instructions (Addendum)
Give [place] 2-3 business days to get the results of your labs back.   Keep the diet clean and stay active.  Please get me a copy of your advanced directive form at your convenience.   Try to track your protein. Aim for around 100-120 g daily.   Let us know if you need anything.

## 2023-02-07 ENCOUNTER — Other Ambulatory Visit: Payer: Self-pay

## 2023-02-07 ENCOUNTER — Other Ambulatory Visit (HOSPITAL_BASED_OUTPATIENT_CLINIC_OR_DEPARTMENT_OTHER): Payer: Self-pay

## 2023-02-07 DIAGNOSIS — F411 Generalized anxiety disorder: Secondary | ICD-10-CM | POA: Diagnosis not present

## 2023-02-08 DIAGNOSIS — M542 Cervicalgia: Secondary | ICD-10-CM | POA: Diagnosis not present

## 2023-02-08 DIAGNOSIS — R2689 Other abnormalities of gait and mobility: Secondary | ICD-10-CM | POA: Diagnosis not present

## 2023-02-08 DIAGNOSIS — M5459 Other low back pain: Secondary | ICD-10-CM | POA: Diagnosis not present

## 2023-02-08 DIAGNOSIS — R293 Abnormal posture: Secondary | ICD-10-CM | POA: Diagnosis not present

## 2023-02-13 ENCOUNTER — Encounter (HOSPITAL_BASED_OUTPATIENT_CLINIC_OR_DEPARTMENT_OTHER): Payer: Self-pay

## 2023-02-13 ENCOUNTER — Ambulatory Visit (HOSPITAL_BASED_OUTPATIENT_CLINIC_OR_DEPARTMENT_OTHER)
Admission: RE | Admit: 2023-02-13 | Discharge: 2023-02-13 | Disposition: A | Discharge status: Home / Self Care | Payer: BC Managed Care – PPO | Source: Ambulatory Visit | Attending: Obstetrics and Gynecology | Admitting: Obstetrics and Gynecology

## 2023-02-13 DIAGNOSIS — Z1231 Encounter for screening mammogram for malignant neoplasm of breast: Secondary | ICD-10-CM | POA: Diagnosis not present

## 2023-02-13 DIAGNOSIS — Z01419 Encounter for gynecological examination (general) (routine) without abnormal findings: Secondary | ICD-10-CM

## 2023-02-20 DIAGNOSIS — M542 Cervicalgia: Secondary | ICD-10-CM | POA: Diagnosis not present

## 2023-02-20 DIAGNOSIS — R293 Abnormal posture: Secondary | ICD-10-CM | POA: Diagnosis not present

## 2023-02-20 DIAGNOSIS — M5459 Other low back pain: Secondary | ICD-10-CM | POA: Diagnosis not present

## 2023-02-20 DIAGNOSIS — R2689 Other abnormalities of gait and mobility: Secondary | ICD-10-CM | POA: Diagnosis not present

## 2023-02-21 DIAGNOSIS — F411 Generalized anxiety disorder: Secondary | ICD-10-CM | POA: Diagnosis not present

## 2023-02-28 ENCOUNTER — Ambulatory Visit
Admission: EM | Admit: 2023-02-28 | Discharge: 2023-02-28 | Disposition: A | Discharge status: Home / Self Care | Payer: BC Managed Care – PPO | Attending: Family Medicine | Admitting: Family Medicine

## 2023-02-28 DIAGNOSIS — J069 Acute upper respiratory infection, unspecified: Secondary | ICD-10-CM | POA: Diagnosis not present

## 2023-02-28 LAB — POCT INFLUENZA A/B
Influenza A, POC: NEGATIVE
Influenza B, POC: NEGATIVE

## 2023-02-28 LAB — POC SARS CORONAVIRUS 2 AG -  ED: SARS Coronavirus 2 Ag: NEGATIVE

## 2023-02-28 NOTE — ED Provider Notes (Signed)
[facility]    CSN: [id number] Arrival date & time: 02/28/23  0821      History   Chief Complaint Chief Complaint  Patient presents with   Ear Fullness   Sore Throat   Shortness of Breath   Nasal Congestion    HPI Tara Rosales is a 42 y.o. female.   HPI  Patient is here with an upper respiratory infection.  She has mild sore throat and cough, ear fullness, nasal congestion.  She has some fatigue and headache but no real body aches.  Has not checked her temperature.  No sweats or chills.  She has not done any COVID testing no known exposure to illness  Past Medical History:  Diagnosis Date   Abnormal uterine bleeding (AUB)    Allergic rhinitis    History of cervical dysplasia    s/p  LEEP  in 2004    in gyn office   Hypothyroidism due to Hashimoto's thyroiditis 09/19/2016   pcp and endocrinologist-- Tara Rosales   Myofascial pain syndrome 10/14/2022   Positive ANA (antinuclear antibody) 10/14/2022   work-up done by rheumologist--- Tara Rosales   Wears glasses     Patient Active Problem List   Diagnosis Date Noted   Abnormal uterine bleeding 11/16/2022   Hypothyroidism 09/19/2016   Vitamin D deficiency 03/26/2015   Abnormal Pap smear of cervix 07/24/2014    Class: History of   Asthma 05/07/2011    Past Surgical History:  Procedure Laterality Date   BREAST REDUCTION SURGERY Bilateral 1999   CERVICAL BIOPSY  W/ LOOP ELECTRODE EXCISION  2004   DILITATION & CURRETTAGE/HYSTROSCOPY WITH NOVASURE ABLATION N/A 11/16/2022   Procedure: DILATATION & CURETTAGE/HYSTEROSCOPY WITH NOVASURE ABLATION;  Surgeon: Tara Rosales;  Location: [place];  Service: Gynecology;  Laterality: N/A;   LYMPH NODE DISSECTION  2001   per pt under chin for enlargement,  benign   TONSILLECTOMY AND ADENOIDECTOMY  1992   child   WISDOM TOOTH EXTRACTION  1995    OB History     Gravida  0   Para  0   Term  0   Preterm  0   AB  0    Living  0      SAB  0   IAB  0   Ectopic  0   Multiple  0   Live Births               Home Medications    Prior to Admission medications   Medication Sig Start Date End Date Taking? Authorizing Provider  cetirizine (ZYRTEC) 10 MG tablet Take 10 mg by mouth at bedtime.    Provider, Historical, Rosales  Cholecalciferol (VITAMIN D-3 PO) Take 1 capsule by mouth daily.    Provider, Historical, Rosales    Family History Family History  Problem Relation Age of Onset   Hypertension Mother    Hypertension Father    Cancer Father        prostate   Stroke Father    Cancer Maternal Grandmother        rectal   Diabetes Maternal Grandfather    Breast cancer Paternal Grandmother 47   Heart attack Paternal Grandfather     Social History Social History   Tobacco Use   Smoking status: Never    Passive exposure: Never   Smokeless tobacco: Never  Vaping Use   Vaping Use: Never used  Substance Use Topics   Drug use: Never  Allergies   Sulfa antibiotics, Penicillins, Egg-derived products, Gluten meal, Other, Peanut-containing drug products, and Soy allergy   Review of Systems Review of Systems See HPI  Physical Exam Triage Vital Signs ED Triage Vitals  Enc Vitals Group     BP 02/28/23 0840 113/80     Pulse Rate 02/28/23 0840 97     Resp 02/28/23 0840 20     Temp 02/28/23 0840 99.1 F (37.3 C)     Temp Source 02/28/23 0840 Oral     SpO2 02/28/23 0840 97 %     Weight 02/28/23 0838 230 lb (104.3 kg)     Height 02/28/23 0838 5\' 8"  (1.727 m)     Head Circumference --      Peak Flow --      Pain Score 02/28/23 0837 7     Pain Loc --      Pain Edu? --      Excl. in [place]? --    No data found.  Updated Vital Signs BP 113/80 (BP Location: Right Arm)   Pulse 97   Temp 99.1 F (37.3 C) (Oral)   Resp 20   Ht 5\' 8"  (1.727 m)   Wt 104.3 kg   SpO2 97%   BMI 34.97 kg/m       Physical Exam Constitutional:      General: She is not in acute distress.     Appearance: She is well-developed. She is ill-appearing.  HENT:     Head: Normocephalic and atraumatic.     Right Ear: Tympanic membrane and ear canal normal.     Left Ear: Tympanic membrane and ear canal normal.     Nose: Congestion and rhinorrhea present.     Mouth/Throat:     Mouth: Mucous membranes are moist.     Pharynx: Uvula midline. Posterior oropharyngeal erythema present. No pharyngeal swelling.  Eyes:     Conjunctiva/sclera: Conjunctivae normal.     Pupils: Pupils are equal, round, and reactive to light.  Cardiovascular:     Rate and Rhythm: Normal rate and regular rhythm.     Heart sounds: Normal heart sounds.  Pulmonary:     Effort: Pulmonary effort is normal. No respiratory distress.     Breath sounds: Normal breath sounds.  Abdominal:     General: There is no distension.     Palpations: Abdomen is soft.  Musculoskeletal:        General: Normal range of motion.     Cervical back: Normal range of motion.  Lymphadenopathy:     Cervical: No cervical adenopathy.  Skin:    General: Skin is warm and dry.  Neurological:     Mental Status: She is alert.      UC Treatments / Results  Labs (all labs ordered are listed, but only abnormal results are displayed) Labs Reviewed  POCT INFLUENZA A/B  POC SARS CORONAVIRUS 2 AG -  ED    EKG   Radiology No results found.  Procedures Procedures (including critical care time)  Medications Ordered in UC Medications - No data to display  Initial Impression / Assessment and Plan / UC Course  I have reviewed the triage vital signs and the nursing notes.  Pertinent labs & imaging results that were available during my care of the patient were reviewed by me and considered in my medical decision making (see chart for details).     Patient has sore throat and upper respiratory infection.  Flu and COVID testing are negative.  This is a viral illness.  Antibiotics will not help her get better faster.  Discussed home  treatment Final Clinical Impressions(s) / UC Diagnoses   Final diagnoses:  Viral upper respiratory tract infection     Discharge Instructions      May take over-the-counter cough or cold medicines as needed Drink lots of fluids Get plenty of rest Consider Flonase for the ear pressure and pain, and sinus congestion   ED Prescriptions   None    PDMP not reviewed this encounter.   Tara Everts, Rosales 02/28/23 [phone]

## 2023-02-28 NOTE — Discharge Instructions (Signed)
May take over-the-counter cough or cold medicines as needed Drink lots of fluids Get plenty of rest Consider Flonase for the ear pressure and pain, and sinus congestion

## 2023-02-28 NOTE — ED Triage Notes (Signed)
Pt presents to Urgent Care with c/o fatigue, sore throat, mild cough w/ sob, bil ear fullness, nasal congestion, and body aches x 2-3 days. Has not checked temp, no COVID test done.

## 2023-03-06 ENCOUNTER — Other Ambulatory Visit: Payer: Self-pay | Admitting: Family Medicine

## 2023-03-06 ENCOUNTER — Other Ambulatory Visit (HOSPITAL_BASED_OUTPATIENT_CLINIC_OR_DEPARTMENT_OTHER): Payer: Self-pay

## 2023-03-06 MED ORDER — LEVOTHYROXINE SODIUM 75 MCG PO TABS
75.0000 ug | ORAL_TABLET | Freq: Every day | ORAL | 2 refills | Status: DC
  Filled 2023-03-06: qty 90, 90d supply, fill #0
  Filled 2023-05-31: qty 90, 90d supply, fill #1
  Filled 2023-08-30: qty 90, 90d supply, fill #2

## 2023-03-06 NOTE — Telephone Encounter (Signed)
Prescription said discontinued?

## 2023-03-13 DIAGNOSIS — M542 Cervicalgia: Secondary | ICD-10-CM | POA: Diagnosis not present

## 2023-03-13 DIAGNOSIS — M5459 Other low back pain: Secondary | ICD-10-CM | POA: Diagnosis not present

## 2023-03-13 DIAGNOSIS — R293 Abnormal posture: Secondary | ICD-10-CM | POA: Diagnosis not present

## 2023-03-13 DIAGNOSIS — R2689 Other abnormalities of gait and mobility: Secondary | ICD-10-CM | POA: Diagnosis not present

## 2023-03-14 DIAGNOSIS — F411 Generalized anxiety disorder: Secondary | ICD-10-CM | POA: Diagnosis not present

## 2023-03-28 DIAGNOSIS — F411 Generalized anxiety disorder: Secondary | ICD-10-CM | POA: Diagnosis not present

## 2023-04-10 DIAGNOSIS — M542 Cervicalgia: Secondary | ICD-10-CM | POA: Diagnosis not present

## 2023-04-10 DIAGNOSIS — R2689 Other abnormalities of gait and mobility: Secondary | ICD-10-CM | POA: Diagnosis not present

## 2023-04-10 DIAGNOSIS — R293 Abnormal posture: Secondary | ICD-10-CM | POA: Diagnosis not present

## 2023-04-10 DIAGNOSIS — M5459 Other low back pain: Secondary | ICD-10-CM | POA: Diagnosis not present

## 2023-04-18 DIAGNOSIS — F411 Generalized anxiety disorder: Secondary | ICD-10-CM | POA: Diagnosis not present

## 2023-04-19 ENCOUNTER — Other Ambulatory Visit (HOSPITAL_BASED_OUTPATIENT_CLINIC_OR_DEPARTMENT_OTHER): Payer: Self-pay

## 2023-04-20 ENCOUNTER — Other Ambulatory Visit (HOSPITAL_BASED_OUTPATIENT_CLINIC_OR_DEPARTMENT_OTHER): Payer: Self-pay

## 2023-04-24 DIAGNOSIS — M5459 Other low back pain: Secondary | ICD-10-CM | POA: Diagnosis not present

## 2023-04-24 DIAGNOSIS — R293 Abnormal posture: Secondary | ICD-10-CM | POA: Diagnosis not present

## 2023-04-24 DIAGNOSIS — M542 Cervicalgia: Secondary | ICD-10-CM | POA: Diagnosis not present

## 2023-04-24 DIAGNOSIS — R2689 Other abnormalities of gait and mobility: Secondary | ICD-10-CM | POA: Diagnosis not present

## 2023-04-28 ENCOUNTER — Other Ambulatory Visit (INDEPENDENT_AMBULATORY_CARE_PROVIDER_SITE_OTHER): Payer: BC Managed Care – PPO

## 2023-04-28 DIAGNOSIS — E559 Vitamin D deficiency, unspecified: Secondary | ICD-10-CM

## 2023-04-28 LAB — VITAMIN D 25 HYDROXY (VIT D DEFICIENCY, FRACTURES): VITD: 37.95 ng/mL (ref 30.00–100.00)

## 2023-05-02 DIAGNOSIS — F411 Generalized anxiety disorder: Secondary | ICD-10-CM | POA: Diagnosis not present

## 2023-05-08 DIAGNOSIS — M542 Cervicalgia: Secondary | ICD-10-CM | POA: Diagnosis not present

## 2023-05-08 DIAGNOSIS — R2689 Other abnormalities of gait and mobility: Secondary | ICD-10-CM | POA: Diagnosis not present

## 2023-05-08 DIAGNOSIS — M5459 Other low back pain: Secondary | ICD-10-CM | POA: Diagnosis not present

## 2023-05-08 DIAGNOSIS — R293 Abnormal posture: Secondary | ICD-10-CM | POA: Diagnosis not present

## 2023-05-15 DIAGNOSIS — R293 Abnormal posture: Secondary | ICD-10-CM | POA: Diagnosis not present

## 2023-05-15 DIAGNOSIS — R2689 Other abnormalities of gait and mobility: Secondary | ICD-10-CM | POA: Diagnosis not present

## 2023-05-15 DIAGNOSIS — M542 Cervicalgia: Secondary | ICD-10-CM | POA: Diagnosis not present

## 2023-05-15 DIAGNOSIS — M5459 Other low back pain: Secondary | ICD-10-CM | POA: Diagnosis not present

## 2023-05-16 DIAGNOSIS — F411 Generalized anxiety disorder: Secondary | ICD-10-CM | POA: Diagnosis not present

## 2023-05-18 DIAGNOSIS — M47812 Spondylosis without myelopathy or radiculopathy, cervical region: Secondary | ICD-10-CM | POA: Diagnosis not present

## 2023-05-18 DIAGNOSIS — M9902 Segmental and somatic dysfunction of thoracic region: Secondary | ICD-10-CM | POA: Diagnosis not present

## 2023-05-18 DIAGNOSIS — M9903 Segmental and somatic dysfunction of lumbar region: Secondary | ICD-10-CM | POA: Diagnosis not present

## 2023-05-18 DIAGNOSIS — M9901 Segmental and somatic dysfunction of cervical region: Secondary | ICD-10-CM | POA: Diagnosis not present

## 2023-05-22 DIAGNOSIS — R293 Abnormal posture: Secondary | ICD-10-CM | POA: Diagnosis not present

## 2023-05-22 DIAGNOSIS — R2689 Other abnormalities of gait and mobility: Secondary | ICD-10-CM | POA: Diagnosis not present

## 2023-05-22 DIAGNOSIS — M542 Cervicalgia: Secondary | ICD-10-CM | POA: Diagnosis not present

## 2023-05-22 DIAGNOSIS — M5459 Other low back pain: Secondary | ICD-10-CM | POA: Diagnosis not present

## 2023-05-29 DIAGNOSIS — R293 Abnormal posture: Secondary | ICD-10-CM | POA: Diagnosis not present

## 2023-05-29 DIAGNOSIS — R2689 Other abnormalities of gait and mobility: Secondary | ICD-10-CM | POA: Diagnosis not present

## 2023-05-29 DIAGNOSIS — M542 Cervicalgia: Secondary | ICD-10-CM | POA: Diagnosis not present

## 2023-05-29 DIAGNOSIS — M5459 Other low back pain: Secondary | ICD-10-CM | POA: Diagnosis not present

## 2023-05-30 DIAGNOSIS — F411 Generalized anxiety disorder: Secondary | ICD-10-CM | POA: Diagnosis not present

## 2023-05-31 ENCOUNTER — Other Ambulatory Visit: Payer: Self-pay | Admitting: Family Medicine

## 2023-05-31 ENCOUNTER — Other Ambulatory Visit (HOSPITAL_BASED_OUTPATIENT_CLINIC_OR_DEPARTMENT_OTHER): Payer: Self-pay

## 2023-05-31 MED ORDER — LIOTHYRONINE SODIUM 5 MCG PO TABS
5.0000 ug | ORAL_TABLET | Freq: Two times a day (BID) | ORAL | 2 refills | Status: DC
  Filled 2023-05-31 – 2023-07-18 (×2): qty 180, 90d supply, fill #0
  Filled 2023-10-19: qty 180, 90d supply, fill #1
  Filled 2024-01-18: qty 180, 90d supply, fill #2

## 2023-06-05 DIAGNOSIS — R293 Abnormal posture: Secondary | ICD-10-CM | POA: Diagnosis not present

## 2023-06-05 DIAGNOSIS — R2689 Other abnormalities of gait and mobility: Secondary | ICD-10-CM | POA: Diagnosis not present

## 2023-06-05 DIAGNOSIS — M5459 Other low back pain: Secondary | ICD-10-CM | POA: Diagnosis not present

## 2023-06-05 DIAGNOSIS — M542 Cervicalgia: Secondary | ICD-10-CM | POA: Diagnosis not present

## 2023-06-20 DIAGNOSIS — F411 Generalized anxiety disorder: Secondary | ICD-10-CM | POA: Diagnosis not present

## 2023-06-20 DIAGNOSIS — F331 Major depressive disorder, recurrent, moderate: Secondary | ICD-10-CM | POA: Diagnosis not present

## 2023-07-04 ENCOUNTER — Ambulatory Visit
Admission: EM | Admit: 2023-07-04 | Discharge: 2023-07-04 | Disposition: A | Discharge status: Home / Self Care | Payer: BC Managed Care – PPO | Attending: Family Medicine | Admitting: Family Medicine

## 2023-07-04 ENCOUNTER — Other Ambulatory Visit (HOSPITAL_BASED_OUTPATIENT_CLINIC_OR_DEPARTMENT_OTHER): Payer: Self-pay

## 2023-07-04 DIAGNOSIS — F411 Generalized anxiety disorder: Secondary | ICD-10-CM | POA: Diagnosis not present

## 2023-07-04 DIAGNOSIS — F331 Major depressive disorder, recurrent, moderate: Secondary | ICD-10-CM | POA: Diagnosis not present

## 2023-07-04 DIAGNOSIS — J0140 Acute pansinusitis, unspecified: Secondary | ICD-10-CM | POA: Diagnosis not present

## 2023-07-04 DIAGNOSIS — J302 Other seasonal allergic rhinitis: Secondary | ICD-10-CM

## 2023-07-04 MED ORDER — PREDNISONE 20 MG PO TABS
40.0000 mg | ORAL_TABLET | Freq: Every day | ORAL | 0 refills | Status: DC
  Filled 2023-07-04: qty 10, 5d supply, fill #0

## 2023-07-04 MED ORDER — AMOXICILLIN 875 MG PO TABS
875.0000 mg | ORAL_TABLET | Freq: Two times a day (BID) | ORAL | 0 refills | Status: DC
  Filled 2023-07-04: qty 14, 7d supply, fill #0

## 2023-07-04 NOTE — Discharge Instructions (Signed)
Take the prednisone as directed If you fail to improve, or have worsening symptoms fill and take the amoxicillin Lots of fluids Continue allergy meds

## 2023-07-04 NOTE — ED Triage Notes (Signed)
Headache, eyes feel puffy, sore throat - since last week. Took at home covid test and it was negative.

## 2023-07-04 NOTE — ED Provider Notes (Signed)
[name]    CSN: [id number] Arrival date & time: 07/04/23  0801      History   Chief Complaint Chief Complaint  Patient presents with   Sore Throat    HPI Tara Rosales is a 42 y.o. female.   HPI Patient states she has not felt well for over a week.  She has runny and stuffy nose, postnasal drip, sore throat.  Fatigue.  Sinus pressure and pain.  Headaches. She states she does have some allergies but is taking her Zyrtec daily.  Intermittently will take Flonase as well   Past Medical History:  Diagnosis Date   Abnormal uterine bleeding (AUB)    Allergic rhinitis    History of cervical dysplasia    s/p  LEEP  in 2004    in gyn office   Hypothyroidism due to Hashimoto's thyroiditis 09/19/2016   pcp and endocrinologist-- Johnella Moloney NP   Myofascial pain syndrome 10/14/2022   Positive ANA (antinuclear antibody) 10/14/2022   work-up done by rheumologist--- dr s. Corliss Skains   Wears glasses     Patient Active Problem List   Diagnosis Date Noted   Abnormal uterine bleeding 11/16/2022   Hypothyroidism 09/19/2016   Vitamin D deficiency 03/26/2015   Abnormal Pap smear of cervix 07/24/2014    Class: History of   Asthma 05/07/2011    Past Surgical History:  Procedure Laterality Date   BREAST REDUCTION SURGERY Bilateral 1999   CERVICAL BIOPSY  W/ LOOP ELECTRODE EXCISION  2004   DILITATION & CURRETTAGE/HYSTROSCOPY WITH NOVASURE ABLATION N/A 11/16/2022   Procedure: DILATATION & CURETTAGE/HYSTEROSCOPY WITH NOVASURE ABLATION;  Surgeon: Lorriane Shire, MD;  Location: [facility];  Service: Gynecology;  Laterality: N/A;   LYMPH NODE DISSECTION  2001   per pt under chin for enlargement,  benign   TONSILLECTOMY AND ADENOIDECTOMY  1992   child   WISDOM TOOTH EXTRACTION  1995    OB History     Gravida  0   Para  0   Term  0   Preterm  0   AB  0   Living  0      SAB  0   IAB  0   Ectopic  0   Multiple  0   Live Births                Home Medications    Prior to Admission medications   Medication Sig Start Date End Date Taking? Authorizing Provider  amoxicillin (AMOXIL) 875 MG tablet Take 1 tablet (875 mg total) by mouth 2 (two) times daily. 07/04/23  Yes Eustace Moore, MD  predniSONE (DELTASONE) 20 MG tablet Take 2 tablets (40 mg total) by mouth daily with breakfast. 07/04/23  Yes Eustace Moore, MD  cetirizine (ZYRTEC) 10 MG tablet Take 10 mg by mouth at bedtime.    [provider]  Cholecalciferol (VITAMIN D-3 PO) Take 1 capsule by mouth daily.    [provider]  levothyroxine (SYNTHROID) 75 MCG tablet Take 1 tablet (75 mcg total) by mouth daily. 03/06/23   Sharlene Dory, DO  liothyronine (CYTOMEL) 5 MCG tablet Take 1 tablet (5 mcg total) by mouth 2 (two) times daily. 05/31/23   Sharlene Dory, DO    Family History Family History  Problem Relation Age of Onset   Hypertension Mother    Hypertension Father    Cancer Father        prostate   Stroke Father  Cancer Maternal Grandmother        rectal   Diabetes Maternal Grandfather    Breast cancer Paternal Grandmother 47   Heart attack Paternal Grandfather     Social History Social History   Tobacco Use   Smoking status: Never    Passive exposure: Never   Smokeless tobacco: Never  Vaping Use   Vaping status: Never Used  Substance Use Topics   Drug use: Never     Allergies   Penicillins, Sulfa antibiotics, Egg-derived products, Gluten meal, Other, Peanut-containing drug products, and Soy allergy   Review of Systems Review of Systems See HPI  Physical Exam Triage Vital Signs ED Triage Vitals  Encounter Vitals Group     BP 07/04/23 0818 119/85     Systolic BP Percentile --      Diastolic BP Percentile --      Pulse Rate 07/04/23 0818 76     Resp 07/04/23 0818 16     Temp 07/04/23 0818 98.5 F (36.9 C)     Temp Source 07/04/23 0818 Oral     SpO2 07/04/23 0818 96 %     Weight --       Height --      Head Circumference --      Peak Flow --      Pain Score 07/04/23 0816 7     Pain Loc --      Pain Education --      Exclude from Growth Chart --    No data found.  Updated Vital Signs BP 119/85 (BP Location: Right Arm)   Pulse 76   Temp 98.5 F (36.9 C) (Oral)   Resp 16   SpO2 96%      Physical Exam Constitutional:      General: She is not in acute distress.    Appearance: She is well-developed. She is ill-appearing.  HENT:     Head: Normocephalic and atraumatic.     Right Ear: Tympanic membrane and ear canal normal.     Left Ear: Tympanic membrane and ear canal normal.     Nose: Congestion and rhinorrhea present.     Mouth/Throat:     Pharynx: Posterior oropharyngeal erythema present.  Eyes:     Conjunctiva/sclera: Conjunctivae normal.     Pupils: Pupils are equal, round, and reactive to light.  Cardiovascular:     Rate and Rhythm: Normal rate and regular rhythm.     Heart sounds: Normal heart sounds.  Pulmonary:     Effort: Pulmonary effort is normal. No respiratory distress.     Breath sounds: Normal breath sounds.  Abdominal:     General: There is no distension.     Palpations: Abdomen is soft.  Musculoskeletal:        General: Normal range of motion.     Cervical back: Normal range of motion.  Lymphadenopathy:     Cervical: No cervical adenopathy.  Skin:    General: Skin is warm and dry.  Neurological:     Mental Status: She is alert.      UC Treatments / Results  Labs (all labs ordered are listed, but only abnormal results are displayed) Labs Reviewed - No data to display  EKG   Radiology No results found.  Procedures Procedures (including critical care time)  Medications Ordered in UC Medications - No data to display  Initial Impression / Assessment and Plan / UC Course  I have reviewed the triage vital signs and the nursing notes.  Pertinent labs & imaging results that were available during my care of the patient  were reviewed by me and considered in my medical decision making (see chart for details).     Discussed with patient that most respiratory infections including sinusitis are caused by a virus.  This may be exacerbated by her environmental allergies.  Recommend she continue her allergy medicine.  Add prednisone for 5 days.  If she fails to improve or sees worsening of condition may try amoxicillin Final Clinical Impressions(s) / UC Diagnoses   Final diagnoses:  Acute non-recurrent pansinusitis  Seasonal allergies     Discharge Instructions      Take the prednisone as directed If you fail to improve, or have worsening symptoms fill and take the amoxicillin Lots of fluids Continue allergy meds    ED Prescriptions     Medication Sig Dispense Auth. Provider   predniSONE (DELTASONE) 20 MG tablet Take 2 tablets (40 mg total) by mouth daily with breakfast. 10 tablet Eustace Moore, MD   amoxicillin (AMOXIL) 875 MG tablet Take 1 tablet (875 mg total) by mouth 2 (two) times daily. 14 tablet Eustace Moore, MD      PDMP not reviewed this encounter.   Eustace Moore, MD 07/04/23 [phone]

## 2023-07-18 ENCOUNTER — Other Ambulatory Visit (HOSPITAL_BASED_OUTPATIENT_CLINIC_OR_DEPARTMENT_OTHER): Payer: Self-pay

## 2023-07-25 DIAGNOSIS — F331 Major depressive disorder, recurrent, moderate: Secondary | ICD-10-CM | POA: Diagnosis not present

## 2023-07-25 DIAGNOSIS — F411 Generalized anxiety disorder: Secondary | ICD-10-CM | POA: Diagnosis not present

## 2023-08-16 ENCOUNTER — Other Ambulatory Visit (HOSPITAL_BASED_OUTPATIENT_CLINIC_OR_DEPARTMENT_OTHER): Payer: Self-pay

## 2023-08-16 ENCOUNTER — Encounter: Payer: Self-pay | Admitting: Emergency Medicine

## 2023-08-16 ENCOUNTER — Ambulatory Visit
Admission: EM | Admit: 2023-08-16 | Discharge: 2023-08-16 | Disposition: A | Discharge status: Home / Self Care | Payer: BC Managed Care – PPO | Attending: Family Medicine | Admitting: Family Medicine

## 2023-08-16 DIAGNOSIS — J309 Allergic rhinitis, unspecified: Secondary | ICD-10-CM

## 2023-08-16 DIAGNOSIS — R059 Cough, unspecified: Secondary | ICD-10-CM | POA: Diagnosis not present

## 2023-08-16 DIAGNOSIS — J069 Acute upper respiratory infection, unspecified: Secondary | ICD-10-CM | POA: Diagnosis not present

## 2023-08-16 MED ORDER — PREDNISONE 20 MG PO TABS
ORAL_TABLET | ORAL | 0 refills | Status: DC
  Filled 2023-08-16: qty 15, 5d supply, fill #0

## 2023-08-16 MED ORDER — DOXYCYCLINE HYCLATE 100 MG PO CAPS
100.0000 mg | ORAL_CAPSULE | Freq: Two times a day (BID) | ORAL | 0 refills | Status: AC
  Filled 2023-08-16: qty 14, 7d supply, fill #0

## 2023-08-16 MED ORDER — FEXOFENADINE HCL 180 MG PO TABS
180.0000 mg | ORAL_TABLET | Freq: Every day | ORAL | 0 refills | Status: DC
  Filled 2023-08-16: qty 15, 15d supply, fill #0

## 2023-08-16 NOTE — ED Triage Notes (Signed)
Febrile 1 week prior, w/ coughing, sneezing, headache. Negative covid test at that time. Felt mildly better Sunday. Woke up this morning and felt pressure on her chest and like she couldn't take a full breath. Denies chest pain and SOB, fever. Symptoms continuing into today, reports a productive wet cough.

## 2023-08-16 NOTE — Discharge Instructions (Addendum)
Advised patient to take medication as directed with food to completion.  Advised patient to take Allegra and Prednisone with first dose of Doxycycline for the next 5 of 7 days.  Advised may discontinue Allegra after 3 to 5 days and use as needed for concurrent postnasal drainage/drip.  Advised patient to hold Zyrtec while taking Allegra.  Encouraged to increase daily water intake to 64 ounces per day while taking these medications.

## 2023-08-16 NOTE — ED Provider Notes (Signed)
[facility]    CSN: [id number] Arrival date & time: 08/16/23  1600      History   Chief Complaint Chief Complaint  Patient presents with   Cough    HPI Tara Rosales is a 42 y.o. female.   HPI patient presents with fever, coughing, sneezing, and headache and chest pressure on and off for 1 week.  Patient denies chest pain or shortness of breath.  PMH significant for morbid obesity, myofascial pain syndrome, and hypothyroidism.  Past Medical History:  Diagnosis Date   Abnormal uterine bleeding (AUB)    Allergic rhinitis    History of cervical dysplasia    s/p  LEEP  in 2004    in gyn office   Hypothyroidism due to Hashimoto's thyroiditis 09/19/2016   pcp and endocrinologist-- Johnella Moloney NP   Myofascial pain syndrome 10/14/2022   Positive ANA (antinuclear antibody) 10/14/2022   work-up done by rheumologist--- dr s. Corliss Skains   Wears glasses     Patient Active Problem List   Diagnosis Date Noted   Abnormal uterine bleeding 11/16/2022   Hypothyroidism 09/19/2016   Vitamin D deficiency 03/26/2015   Abnormal Pap smear of cervix 07/24/2014    Class: History of   Asthma 05/07/2011    Past Surgical History:  Procedure Laterality Date   BREAST REDUCTION SURGERY Bilateral 1999   CERVICAL BIOPSY  W/ LOOP ELECTRODE EXCISION  2004   DILITATION & CURRETTAGE/HYSTROSCOPY WITH NOVASURE ABLATION N/A 11/16/2022   Procedure: DILATATION & CURETTAGE/HYSTEROSCOPY WITH NOVASURE ABLATION;  Surgeon: Lorriane Shire, MD;  Location: [facility];  Service: Gynecology;  Laterality: N/A;   LYMPH NODE DISSECTION  2001   per pt under chin for enlargement,  benign   TONSILLECTOMY AND ADENOIDECTOMY  1992   child   WISDOM TOOTH EXTRACTION  1995    OB History     Gravida  0   Para  0   Term  0   Preterm  0   AB  0   Living  0      SAB  0   IAB  0   Ectopic  0   Multiple  0   Live Births               Home Medications    Prior  to Admission medications   Medication Sig Start Date End Date Taking? Authorizing Provider  cetirizine (ZYRTEC) 10 MG tablet Take 10 mg by mouth at bedtime.   Yes [provider]  Cholecalciferol (VITAMIN D-3 PO) Take 1 capsule by mouth daily.   Yes [provider]  doxycycline (VIBRAMYCIN) 100 MG capsule Take 1 capsule (100 mg total) by mouth 2 (two) times daily for 7 days. 08/16/23 08/23/23 Yes Trevor Iha, FNP  fexofenadine [facility] ALLERGY) 180 MG tablet Take 1 tablet (180 mg total) by mouth daily for 15 days. 08/16/23 08/31/23 Yes Trevor Iha, FNP  levothyroxine (SYNTHROID) 75 MCG tablet Take 1 tablet (75 mcg total) by mouth daily. 03/06/23  Yes Sharlene Dory, DO  liothyronine (CYTOMEL) 5 MCG tablet Take 1 tablet (5 mcg total) by mouth 2 (two) times daily. 05/31/23  Yes Sharlene Dory, DO  predniSONE (DELTASONE) 20 MG tablet Take 3 tabs PO daily x 5 days. 08/16/23  Yes Trevor Iha, FNP    Family History Family History  Problem Relation Age of Onset   Hypertension Mother    Hypertension Father    Cancer Father  prostate   Stroke Father    Cancer Maternal Grandmother        rectal   Diabetes Maternal Grandfather    Breast cancer Paternal Grandmother 91   Heart attack Paternal Grandfather     Social History Social History   Tobacco Use   Smoking status: Never    Passive exposure: Never   Smokeless tobacco: Never  Vaping Use   Vaping status: Never Used  Substance Use Topics   Drug use: Never     Allergies   Penicillins, Sulfa antibiotics, Egg-derived products, Gluten meal, Other, Peanut-containing drug products, and Soy allergy   Review of Systems Review of Systems  HENT:  Positive for congestion and sore throat.   Respiratory:  Positive for cough.   Neurological:  Positive for headaches.  All other systems reviewed and are negative.    Physical Exam Triage Vital Signs ED Triage Vitals  Encounter Vitals Group     BP  08/16/23 1630 128/84     Systolic BP Percentile --      Diastolic BP Percentile --      Pulse Rate 08/16/23 1630 80     Resp 08/16/23 1630 16     Temp 08/16/23 1630 98.7 F (37.1 C)     Temp Source 08/16/23 1630 Oral     SpO2 08/16/23 1630 99 %     Weight --      Height --      Head Circumference --      Peak Flow --      Pain Score 08/16/23 1633 0     Pain Loc --      Pain Education --      Exclude from Growth Chart --    No data found.  Updated Vital Signs BP 128/84 (BP Location: Left Arm)   Pulse 80   Temp 98.7 F (37.1 C) (Oral)   Resp 16   SpO2 99%  Physical Exam Vitals and nursing note reviewed.  Constitutional:      Appearance: Normal appearance. She is obese.  HENT:     Head: Normocephalic and atraumatic.     Right Ear: Tympanic membrane, ear canal and external ear normal.     Left Ear: Tympanic membrane, ear canal and external ear normal.     Mouth/Throat:     Mouth: Mucous membranes are moist.     Pharynx: Oropharynx is clear.  Eyes:     Extraocular Movements: Extraocular movements intact.     Conjunctiva/sclera: Conjunctivae normal.     Pupils: Pupils are equal, round, and reactive to light.  Cardiovascular:     Rate and Rhythm: Normal rate and regular rhythm.     Pulses: Normal pulses.     Heart sounds: Normal heart sounds.  Pulmonary:     Effort: Pulmonary effort is normal.     Breath sounds: Normal breath sounds. No wheezing, rhonchi or rales.     Comments: Infrequent nonproductive cough noted on exam Musculoskeletal:        General: Normal range of motion.     Cervical back: Normal range of motion and neck supple.  Skin:    General: Skin is warm and dry.  Neurological:     General: No focal deficit present.     Mental Status: She is alert and oriented to person, place, and time. Mental status is at baseline.  Psychiatric:        Mood and Affect: Mood normal.  Behavior: Behavior normal.      UC Treatments / Results  Labs (all labs  ordered are listed, but only abnormal results are displayed) Labs Reviewed - No data to display  EKG   Radiology No results found.  Procedures Procedures (including critical care time)  Medications Ordered in UC Medications - No data to display  Initial Impression / Assessment and Plan / UC Course  I have reviewed the triage vital signs and the nursing notes.  Pertinent labs & imaging results that were available during my care of the patient were reviewed by me and considered in my medical decision making (see chart for details).     MDM: 1.  Acute URI-Rx'd Doxycycline 100 mg capsule twice daily x 7 days; 2.  Cough, unspecified type-Rx'd prednisone 60 mg daily x 5 days; 3.  Allergic rhinitis, unspecified seasonality, unspecified trigger-Rx Allegra 180 mg fexofenadine without D daily for the next 3 to 5 days, then as needed for concurrent postnasal drainage/drip. Advised patient to take medication as directed with food to completion.  Advised patient to take Allegra and Prednisone with first dose of Doxycycline for the next 5 of 7 days.  Advised may discontinue Allegra after 3 to 5 days and use as needed for concurrent postnasal drainage/drip.  Advised patient to hold Zyrtec while taking Allegra.  Encouraged to increase daily water intake to 64 ounces per day while taking these medications.  Patient discharged home, hemodynamically stable.  Final Clinical Impressions(s) / UC Diagnoses   Final diagnoses:  Acute URI  Cough, unspecified type  Allergic rhinitis, unspecified seasonality, unspecified trigger     Discharge Instructions      Advised patient to take medication as directed with food to completion.  Advised patient to take Allegra and Prednisone with first dose of Doxycycline for the next 5 of 7 days.  Advised may discontinue Allegra after 3 to 5 days and use as needed for concurrent postnasal drainage/drip.  Advised patient to hold Zyrtec while taking Allegra.  Encouraged to  increase daily water intake to 64 ounces per day while taking these medications.     ED Prescriptions     Medication Sig Dispense Auth. Provider   doxycycline (VIBRAMYCIN) 100 MG capsule Take 1 capsule (100 mg total) by mouth 2 (two) times daily for 7 days. 14 capsule Trevor Iha, FNP   predniSONE (DELTASONE) 20 MG tablet Take 3 tabs PO daily x 5 days. 15 tablet Trevor Iha, FNP   fexofenadine [facility] ALLERGY) 180 MG tablet Take 1 tablet (180 mg total) by mouth daily for 15 days. 15 tablet Trevor Iha, FNP      PDMP not reviewed this encounter.   Trevor Iha, FNP 08/16/23 1723

## 2023-08-23 ENCOUNTER — Ambulatory Visit: Payer: BC Managed Care – PPO | Admitting: Family Medicine

## 2023-09-02 DIAGNOSIS — F331 Major depressive disorder, recurrent, moderate: Secondary | ICD-10-CM | POA: Diagnosis not present

## 2023-09-02 DIAGNOSIS — F411 Generalized anxiety disorder: Secondary | ICD-10-CM | POA: Diagnosis not present

## 2023-09-14 DIAGNOSIS — F331 Major depressive disorder, recurrent, moderate: Secondary | ICD-10-CM | POA: Diagnosis not present

## 2023-09-14 DIAGNOSIS — F411 Generalized anxiety disorder: Secondary | ICD-10-CM | POA: Diagnosis not present

## 2023-09-28 DIAGNOSIS — F411 Generalized anxiety disorder: Secondary | ICD-10-CM | POA: Diagnosis not present

## 2023-09-28 DIAGNOSIS — F331 Major depressive disorder, recurrent, moderate: Secondary | ICD-10-CM | POA: Diagnosis not present

## 2023-10-12 DIAGNOSIS — F411 Generalized anxiety disorder: Secondary | ICD-10-CM | POA: Diagnosis not present

## 2023-10-12 DIAGNOSIS — F331 Major depressive disorder, recurrent, moderate: Secondary | ICD-10-CM | POA: Diagnosis not present

## 2023-10-16 ENCOUNTER — Ambulatory Visit (INDEPENDENT_AMBULATORY_CARE_PROVIDER_SITE_OTHER): Payer: BLUE CROSS/BLUE SHIELD | Admitting: Family Medicine

## 2023-10-16 ENCOUNTER — Other Ambulatory Visit (HOSPITAL_BASED_OUTPATIENT_CLINIC_OR_DEPARTMENT_OTHER): Payer: Self-pay

## 2023-10-16 VITALS — BP 128/76 | HR 103 | Temp 99.0°F | Resp 16 | Ht 68.0 in | Wt 221.4 lb

## 2023-10-16 DIAGNOSIS — Z91018 Allergy to other foods: Secondary | ICD-10-CM | POA: Diagnosis not present

## 2023-10-16 MED ORDER — EPINEPHRINE 0.3 MG/0.3ML IJ SOAJ
0.3000 mg | INTRAMUSCULAR | 2 refills | Status: AC | PRN
  Filled 2023-10-16: qty 2, 30d supply, fill #0

## 2023-10-16 NOTE — Patient Instructions (Signed)
Give [place] 2-3 business days to get the results of your labs back.   Food allergy cross reactions:  Cow's milk and goat milk: > 90% of individuals allergic to cow's milk also react to goat's/sheep's milk  Tree nuts and other nuts: > 50% of individuals with an allergy to a tree nut also react to others  Fish: > 50% of individuals allergic to any finned fish are reactive to all types  Wheat and other grains: 25% of individuals with a wheat allergy react to rye and barley  Cow's milk and beef: 10% of individuals with milk allergy react to beef  Peanuts and other legumes: < 10% of individuals with a peanut allergy react to other legumes  Soy and other legumes: < 5% of individuals with a soy allergy react to other legumes  Eggs and chicken: < 5% of individuals have both allergies  Let us know if you need anything.

## 2023-10-16 NOTE — Progress Notes (Signed)
Chief Complaint  Patient presents with   Allergic Reaction    Discuss allergic reaction    Tara Rosales is a 42 y.o. female here for an allergic reaction.  Duration: 2 days Any new medications, lotions, soaps, topicals or detergents? No ACEi/ARB/Estrogen? No Hx of allergic rxn/angioedema/anaphylaxis? Yes- food allergies usually leading to diarrhea and swelling to face She currently specifically denies shortness of breath, tongue or lip swelling, or swelling in the throat. She is still having diarrhea.   Past Medical History:  Diagnosis Date   Abnormal uterine bleeding (AUB)    Allergic rhinitis    History of cervical dysplasia    s/p  LEEP  in 2004    in gyn office   Hypothyroidism due to Hashimoto's thyroiditis 09/19/2016   pcp and endocrinologist-- Tara Moloney NP   Myofascial pain syndrome 10/14/2022   Positive ANA (antinuclear antibody) 10/14/2022   work-up done by rheumologist--- Tara Rosales   Wears glasses     Family History  Problem Relation Age of Onset   Hypertension Mother    Hypertension Father    Cancer Father        prostate   Stroke Father    Cancer Maternal Grandmother        rectal   Diabetes Maternal Grandfather    Breast cancer Paternal Grandmother 77   Heart attack Paternal Grandfather     BP 128/76 (BP Location: Left Arm, Patient Position: Sitting, Cuff Size: Normal)   Pulse (!) 103   Temp 99 F (37.2 C) (Oral)   Resp 16   Ht 5\' 8"  (1.727 m)   Wt 221 lb 6.4 oz (100.4 kg)   SpO2 99%   BMI 33.66 kg/m  General: Well appearing, appearing stated age, well-nourished, awake HEENT: Ears are patent, TM's negative, Nose patent without discharge, MMM, tongue without deviation or edema, uvula without edema, pharynx without erythema or petechiae; Neck without masses, edema or asymmetry Heart: RRR Lungs: CTAB, no rales or stridor, normal respiratory effort without accessory muscle use Skin: Exposed skin is warm and dry without lesion Psych: Age  appropriate judgment and insight, normal affect and mood  Food allergy - Plan: Food Allergy Profile, EPINEPHrine (EPIPEN 2-PAK) 0.3 mg/0.3 mL IJ SOAJ injection  Food allergy testing, pending results possible allergy specialist referral, daily antihistamine: Allegra.  EpiPen ordered. Pt informed to seek emergent care if starting to experience SOB, swelling with tongue or airway/neck.  F/u pending above. The patient voiced understanding and agreement to the plan.  Tara Roche [place], DO 10/16/23 3:07 PM

## 2023-10-17 ENCOUNTER — Encounter: Payer: Self-pay | Admitting: Family Medicine

## 2023-10-17 LAB — FOOD ALLERGY PROFILE
Allergen, Salmon, f41: <0.1 kU/L
Almonds: <0.1 kU/L
CLASS: 0
CLASS: 0
CLASS: 0
CLASS: 0
CLASS: 0
CLASS: 0
CLASS: 0
CLASS: 0
CLASS: 0
CLASS: 0
CLASS: 0
Cashew IgE: <0.1 kU/L
Class: 0
Class: 0
Class: 0
Class: 0
Egg White IgE: <0.1 kU/L
Fish Cod: <0.1 kU/L
Hazelnut: <0.1 kU/L
Milk IgE: <0.1 kU/L
Peanut IgE: <0.1 kU/L
Scallop IgE: <0.1 kU/L
Sesame Seed f10: <0.1 kU/L
Shrimp IgE: <0.1 kU/L
Soybean IgE: <0.1 kU/L
Tuna IgE: <0.1 kU/L
Walnut: <0.1 kU/L
Wheat IgE: <0.1 kU/L

## 2023-10-17 LAB — INTERPRETATION:

## 2023-10-18 ENCOUNTER — Other Ambulatory Visit: Payer: Self-pay

## 2023-10-18 DIAGNOSIS — Z9109 Other allergy status, other than to drugs and biological substances: Secondary | ICD-10-CM

## 2023-10-30 NOTE — Progress Notes (Unsigned)
Office Visit Note  Patient: Tara Rosales             Date of Birth: June 16, 1981           MRN: [id number]             PCP: Sharlene Dory, DO Referring: Sharlene Dory* Visit Date: 11/09/2023 Occupation: @GUAROCC @  Subjective:  No chief complaint on file.   History of Present Illness: Tara Rosales is a 42 y.o. female ***     Activities of Daily Living:  Patient reports morning stiffness for *** {minute/hour:19697}.   Patient {ACTIONS;DENIES/REPORTS:21021675::"Denies"} nocturnal pain.  Difficulty dressing/grooming: {ACTIONS;DENIES/REPORTS:21021675::"Denies"} Difficulty climbing stairs: {ACTIONS;DENIES/REPORTS:21021675::"Denies"} Difficulty getting out of chair: {ACTIONS;DENIES/REPORTS:21021675::"Denies"} Difficulty using hands for taps, buttons, cutlery, and/or writing: {ACTIONS;DENIES/REPORTS:21021675::"Denies"}  No Rheumatology ROS completed.   PMFS History:  Patient Active Problem List   Diagnosis Date Noted   Abnormal uterine bleeding 11/16/2022   Hypothyroidism 09/19/2016   Vitamin D deficiency 03/26/2015   Abnormal Pap smear of cervix 07/24/2014    Class: History of   Asthma 05/07/2011    Past Medical History:  Diagnosis Date   Abnormal uterine bleeding (AUB)    Allergic rhinitis    History of cervical dysplasia    s/p  LEEP  in 2004    in gyn office   Hypothyroidism due to Hashimoto's thyroiditis 09/19/2016   pcp and endocrinologist-- Johnella Moloney NP   Myofascial pain syndrome 10/14/2022   Positive ANA (antinuclear antibody) 10/14/2022   work-up done by rheumologist--- dr s. Corliss Skains   Wears glasses     Family History  Problem Relation Age of Onset   Hypertension Mother    Hypertension Father    Cancer Father        prostate   Stroke Father    Cancer Maternal Grandmother        rectal   Diabetes Maternal Grandfather    Breast cancer Paternal Grandmother 76   Heart attack Paternal Grandfather    Past Surgical History:   Procedure Laterality Date   BREAST REDUCTION SURGERY Bilateral 1999   CERVICAL BIOPSY  W/ LOOP ELECTRODE EXCISION  2004   DILITATION & CURRETTAGE/HYSTROSCOPY WITH NOVASURE ABLATION N/A 11/16/2022   Procedure: DILATATION & CURETTAGE/HYSTEROSCOPY WITH NOVASURE ABLATION;  Surgeon: Lorriane Shire, MD;  Location: [facility];  Service: Gynecology;  Laterality: N/A;   LYMPH NODE DISSECTION  2001   per pt under chin for enlargement,  benign   TONSILLECTOMY AND ADENOIDECTOMY  1992   child   WISDOM TOOTH EXTRACTION  1995   Social History   Social History Narrative   Not on file   Immunization History  Administered Date(s) Administered   Influenza Split 10/03/2017   Influenza, Quadrivalent, Recombinant, Inj, Pf 09/19/2016   Influenza,inj,Quad PF,6+ Mos 09/07/2015, 10/03/2017, 09/26/2018   Influenza-Unspecified 09/21/2014, 09/07/2015, 09/19/2016, 10/03/2017, 09/26/2018, 09/25/2019, 09/18/2021, 10/07/2022, 09/29/2023   Moderna Sars-Covid-2 Vaccination 12/04/2019, 01/01/2020, 10/25/2020, 09/15/2023   Pfizer Covid-19 Vaccine Bivalent Booster 6yrs & up 08/28/2021   Td 08/10/2016   Td (Adult),5 Lf Tetanus Toxid, Preservative Free 08/10/2016   Tdap 12/05/2005, 08/10/2016   Varicella 03/22/1989     Objective: Vital Signs: There were no vitals taken for this visit.   Physical Exam   Musculoskeletal Exam: ***  CDAI Exam: CDAI Score: -- Patient Global: --; Provider Global: -- Swollen: --; Tender: -- Joint Exam 11/09/2023   No joint exam has been documented for this visit   There is currently no information documented on the homunculus.  Go to the Rheumatology activity and complete the homunculus joint exam.  Investigation: No additional findings.  Imaging: No results found.  Recent Labs: Lab Results  Component Value Date   WBC 5.9 01/27/2023   HGB 14.5 01/27/2023   PLT 194.0 01/27/2023   NA 141 01/27/2023   K 4.5 01/27/2023   CL 105 01/27/2023   CO2 27  01/27/2023   GLUCOSE 81 01/27/2023   BUN 8 01/27/2023   CREATININE 0.67 01/27/2023   BILITOT 0.7 01/27/2023   ALKPHOS 61 01/27/2023   AST 17 01/27/2023   ALT 18 01/27/2023   PROT 6.7 01/27/2023   ALBUMIN 4.4 01/27/2023   CALCIUM 9.9 01/27/2023   GFRAA >89 03/23/2015    Speciality Comments: No specialty comments available.  Procedures:  No procedures performed Allergies: Penicillins, Sulfa antibiotics, Egg-derived products, Gluten meal, Other, Peanut-containing drug products, and Soy allergy   Assessment / Plan:     Visit Diagnoses: No diagnosis found.  Orders: No orders of the defined types were placed in this encounter.  No orders of the defined types were placed in this encounter.   Face-to-face time spent with patient was *** minutes. Greater than 50% of time was spent in counseling and coordination of care.  Follow-Up Instructions: No follow-ups on file.   Ellen Henri, CMA  Note - This record has been created using Animal nutritionist.  Chart creation errors have been sought, but may not always  have been located. Such creation errors do not reflect on  the standard of medical care.

## 2023-10-31 ENCOUNTER — Ambulatory Visit: Payer: BC Managed Care – PPO | Admitting: Internal Medicine

## 2023-10-31 DIAGNOSIS — F331 Major depressive disorder, recurrent, moderate: Secondary | ICD-10-CM | POA: Diagnosis not present

## 2023-10-31 DIAGNOSIS — F411 Generalized anxiety disorder: Secondary | ICD-10-CM | POA: Diagnosis not present

## 2023-11-09 ENCOUNTER — Ambulatory Visit: Payer: BC Managed Care – PPO | Admitting: Rheumatology

## 2023-11-09 DIAGNOSIS — E559 Vitamin D deficiency, unspecified: Secondary | ICD-10-CM

## 2023-11-09 DIAGNOSIS — Z8639 Personal history of other endocrine, nutritional and metabolic disease: Secondary | ICD-10-CM

## 2023-11-09 DIAGNOSIS — M7918 Myalgia, other site: Secondary | ICD-10-CM

## 2023-11-09 DIAGNOSIS — R5383 Other fatigue: Secondary | ICD-10-CM

## 2023-11-09 DIAGNOSIS — G4709 Other insomnia: Secondary | ICD-10-CM

## 2023-11-09 DIAGNOSIS — R768 Other specified abnormal immunological findings in serum: Secondary | ICD-10-CM

## 2023-11-09 DIAGNOSIS — R7989 Other specified abnormal findings of blood chemistry: Secondary | ICD-10-CM

## 2023-11-09 DIAGNOSIS — R87612 Low grade squamous intraepithelial lesion on cytologic smear of cervix (LGSIL): Secondary | ICD-10-CM

## 2023-11-14 DIAGNOSIS — F411 Generalized anxiety disorder: Secondary | ICD-10-CM | POA: Diagnosis not present

## 2023-11-14 DIAGNOSIS — F331 Major depressive disorder, recurrent, moderate: Secondary | ICD-10-CM | POA: Diagnosis not present

## 2023-11-25 ENCOUNTER — Other Ambulatory Visit: Payer: Self-pay | Admitting: Family Medicine

## 2023-11-27 ENCOUNTER — Other Ambulatory Visit (HOSPITAL_BASED_OUTPATIENT_CLINIC_OR_DEPARTMENT_OTHER): Payer: Self-pay

## 2023-11-27 MED ORDER — LEVOTHYROXINE SODIUM 75 MCG PO TABS
75.0000 ug | ORAL_TABLET | Freq: Every day | ORAL | 2 refills | Status: DC
  Filled 2023-11-27: qty 90, 90d supply, fill #0
  Filled 2024-02-27: qty 90, 90d supply, fill #1
  Filled 2024-05-24: qty 90, 90d supply, fill #2

## 2023-12-05 DIAGNOSIS — F331 Major depressive disorder, recurrent, moderate: Secondary | ICD-10-CM | POA: Diagnosis not present

## 2023-12-05 DIAGNOSIS — F411 Generalized anxiety disorder: Secondary | ICD-10-CM | POA: Diagnosis not present

## 2023-12-18 ENCOUNTER — Ambulatory Visit: Payer: BC Managed Care – PPO | Admitting: Internal Medicine

## 2023-12-28 DIAGNOSIS — F411 Generalized anxiety disorder: Secondary | ICD-10-CM | POA: Diagnosis not present

## 2023-12-28 DIAGNOSIS — F331 Major depressive disorder, recurrent, moderate: Secondary | ICD-10-CM | POA: Diagnosis not present

## 2024-01-02 ENCOUNTER — Other Ambulatory Visit: Payer: Self-pay

## 2024-01-02 ENCOUNTER — Other Ambulatory Visit (HOSPITAL_BASED_OUTPATIENT_CLINIC_OR_DEPARTMENT_OTHER): Payer: Self-pay

## 2024-01-02 ENCOUNTER — Ambulatory Visit
Admission: EM | Admit: 2024-01-02 | Discharge: 2024-01-02 | Disposition: A | Discharge status: Home / Self Care | Payer: BC Managed Care – PPO | Attending: Family Medicine | Admitting: Family Medicine

## 2024-01-02 DIAGNOSIS — M542 Cervicalgia: Secondary | ICD-10-CM | POA: Diagnosis not present

## 2024-01-02 DIAGNOSIS — M62838 Other muscle spasm: Secondary | ICD-10-CM

## 2024-01-02 MED ORDER — TIZANIDINE HCL 4 MG PO TABS
4.0000 mg | ORAL_TABLET | Freq: Three times a day (TID) | ORAL | 0 refills | Status: DC | PRN
  Filled 2024-01-02: qty 15, 5d supply, fill #0

## 2024-01-02 MED ORDER — KETOROLAC TROMETHAMINE 10 MG PO TABS
10.0000 mg | ORAL_TABLET | Freq: Four times a day (QID) | ORAL | 0 refills | Status: DC | PRN
  Filled 2024-01-02: qty 20, 5d supply, fill #0

## 2024-01-02 MED ORDER — KETOROLAC TROMETHAMINE 30 MG/ML IJ SOLN
30.0000 mg | Freq: Once | INTRAMUSCULAR | Status: AC
  Administered 2024-01-02: 30 mg via INTRAMUSCULAR

## 2024-01-02 NOTE — Discharge Instructions (Signed)
You have been given a shot of Toradol 30 mg today.  Ketorolac 10 mg tablets--take 1 tablet every 6 hours as needed for pain.  This is the same medicine that is in the shot we just gave you   Take tizanidine 4 mg--1 every 8 hours as needed for muscle spasms; this medication can cause dizziness and sleepiness.  Heating pad can help  Please follow-up with your primary care about this issue

## 2024-01-02 NOTE — ED Provider Notes (Signed)
[name]    CSN: [id number] Arrival date & time: 01/02/24  1621      History   Chief Complaint Chief Complaint  Patient presents with   Shoulder Pain    HPI Tara Rosales is a 43 y.o. female.    Shoulder Pain Here for pain in her right posterior shoulder and right upper arm.  About 2 weeks ago she had slept very hard and felt like she slept in 1 position in her right upper arm and posterior shoulder were a little sore and tight.  She took some prescription strength ibuprofen at that point and it got better within a couple of days.  No trauma or fall  Then over the last few days she is started having the same pain again in her right posterior shoulder and neck that radiates into her right upper arm on the posterior aspect.  Her right hand is also tingly.  No muscle weakness and no bowel or bladder incontinence.  Over-the-counter ibuprofen has not helped at this time.  She is allergic to sulfa and penicillins  LMP was about a year ago; she has had an ablation  Past Medical History:  Diagnosis Date   Abnormal uterine bleeding (AUB)    Allergic rhinitis    History of cervical dysplasia    s/p  LEEP  in 2004    in gyn office   Hypothyroidism due to Hashimoto's thyroiditis 09/19/2016   pcp and endocrinologist-- Johnella Moloney NP   Myofascial pain syndrome 10/14/2022   Positive ANA (antinuclear antibody) 10/14/2022   work-up done by rheumologist--- dr s. Corliss Skains   Wears glasses     Patient Active Problem List   Diagnosis Date Noted   Abnormal uterine bleeding 11/16/2022   Hypothyroidism 09/19/2016   Vitamin D deficiency 03/26/2015   Abnormal Pap smear of cervix 07/24/2014    Class: History of   Asthma 05/07/2011    Past Surgical History:  Procedure Laterality Date   BREAST REDUCTION SURGERY Bilateral 1999   CERVICAL BIOPSY  W/ LOOP ELECTRODE EXCISION  2004   DILITATION & CURRETTAGE/HYSTROSCOPY WITH NOVASURE ABLATION N/A 11/16/2022    Procedure: DILATATION & CURETTAGE/HYSTEROSCOPY WITH NOVASURE ABLATION;  Surgeon: Lorriane Shire, MD;  Location: [facility];  Service: Gynecology;  Laterality: N/A;   LYMPH NODE DISSECTION  2001   per pt under chin for enlargement,  benign   TONSILLECTOMY AND ADENOIDECTOMY  1992   child   WISDOM TOOTH EXTRACTION  1995    OB History     Gravida  0   Para  0   Term  0   Preterm  0   AB  0   Living  0      SAB  0   IAB  0   Ectopic  0   Multiple  0   Live Births               Home Medications    Prior to Admission medications   Medication Sig Start Date End Date Taking? Authorizing Provider  ketorolac (TORADOL) 10 MG tablet Take 1 tablet (10 mg total) by mouth every 6 (six) hours as needed (pain). 01/02/24  Yes Emerson Schreifels, Janace Aris, MD  tiZANidine (ZANAFLEX) 4 MG tablet Take 1 tablet (4 mg total) by mouth every 8 (eight) hours as needed for muscle spasms. 01/02/24  Yes Zenia Resides, MD  EPINEPHrine (EPIPEN 2-PAK) 0.3 mg/0.3 mL IJ SOAJ injection Inject 0.3 mg into the muscle as needed  for anaphylaxis. 10/16/23   Sharlene Dory, DO  fexofenadine (ALLEGRA ALLERGY) 180 MG tablet Take 1 tablet (180 mg total) by mouth daily for 15 days. 08/16/23 08/31/23  Trevor Iha, FNP  levothyroxine (SYNTHROID) 75 MCG tablet Take 1 tablet (75 mcg total) by mouth daily. 11/27/23   Sharlene Dory, DO  liothyronine (CYTOMEL) 5 MCG tablet Take 1 tablet (5 mcg total) by mouth 2 (two) times daily. 05/31/23   Sharlene Dory, DO    Family History Family History  Problem Relation Age of Onset   Hypertension Mother    Hypertension Father    Cancer Father        prostate   Stroke Father    Cancer Maternal Grandmother        rectal   Diabetes Maternal Grandfather    Breast cancer Paternal Grandmother 65   Heart attack Paternal Grandfather     Social History Social History   Tobacco Use   Smoking status: Never    Passive exposure:  Never   Smokeless tobacco: Never  Vaping Use   Vaping status: Never Used  Substance Use Topics   Drug use: Never     Allergies   Penicillins, Sulfa antibiotics, Egg-derived products, Gluten meal, Other, Peanut-containing drug products, and Soy allergy (do not select)   Review of Systems Review of Systems   Physical Exam Triage Vital Signs ED Triage Vitals  Encounter Vitals Group     BP 01/02/24 1635 119/83     Systolic BP Percentile --      Diastolic BP Percentile --      Pulse Rate 01/02/24 1635 61     Resp 01/02/24 1635 19     Temp 01/02/24 1635 98.9 F (37.2 C)     Temp src --      SpO2 01/02/24 1635 98 %     Weight --      Height --      Head Circumference --      Peak Flow --      Pain Score 01/02/24 1634 7     Pain Loc --      Pain Education --      Exclude from Growth Chart --    No data found.  Updated Vital Signs BP 119/83   Pulse 61   Temp 98.9 F (37.2 C)   Resp 19   SpO2 98%   Visual Acuity Right Eye Distance:   Left Eye Distance:   Bilateral Distance:    Right Eye Near:   Left Eye Near:    Bilateral Near:     Physical Exam Vitals reviewed.  Constitutional:      General: She is not in acute distress.    Appearance: She is not ill-appearing, toxic-appearing or diaphoretic.  HENT:     Mouth/Throat:     Mouth: Mucous membranes are moist.  Eyes:     Extraocular Movements: Extraocular movements intact.     Pupils: Pupils are equal, round, and reactive to light.  Cardiovascular:     Rate and Rhythm: Normal rate and regular rhythm.     Heart sounds: No murmur heard. Pulmonary:     Effort: Pulmonary effort is normal.     Breath sounds: Normal breath sounds.  Musculoskeletal:     Comments: There is some tenderness of the right trapezius.  Range of motion about the shoulder joint and elbow are normal.  Pulses are normal  Skin:    Coloration: Skin is not jaundiced  or pale.     Findings: No rash.  Neurological:     General: No focal  deficit present.     Mental Status: She is alert and oriented to person, place, and time.  Psychiatric:        Behavior: Behavior normal.      UC Treatments / Results  Labs (all labs ordered are listed, but only abnormal results are displayed) Labs Reviewed - No data to display  EKG   Radiology No results found.  Procedures Procedures (including critical care time)  Medications Ordered in UC Medications  ketorolac (TORADOL) 30 MG/ML injection 30 mg (has no administration in time range)    Initial Impression / Assessment and Plan / UC Course  I have reviewed the triage vital signs and the nursing notes.  Pertinent labs & imaging results that were available during my care of the patient were reviewed by me and considered in my medical decision making (see chart for details).     Toradol injection is given here and Toradol tablets are sent to pharmacy.  Also tizanidine is sent and to use at night.  Neck stretching exercises were given.  She will follow-up with her primary care Final Clinical Impressions(s) / UC Diagnoses   Final diagnoses:  Neck pain  Muscle spasm     Discharge Instructions      You have been given a shot of Toradol 30 mg today.  Ketorolac 10 mg tablets--take 1 tablet every 6 hours as needed for pain.  This is the same medicine that is in the shot we just gave you   Take tizanidine 4 mg--1 every 8 hours as needed for muscle spasms; this medication can cause dizziness and sleepiness.  Heating pad can help  Please follow-up with your primary care about this issue       ED Prescriptions     Medication Sig Dispense Auth. Provider   ketorolac (TORADOL) 10 MG tablet Take 1 tablet (10 mg total) by mouth every 6 (six) hours as needed (pain). 20 tablet Elanah Osmanovic, Janace Aris, MD   tiZANidine (ZANAFLEX) 4 MG tablet Take 1 tablet (4 mg total) by mouth every 8 (eight) hours as needed for muscle spasms. 15 tablet Davionne Dowty, Janace Aris, MD      PDMP  not reviewed this encounter.   Zenia Resides, MD 01/02/24 [phone]

## 2024-01-02 NOTE — ED Triage Notes (Signed)
Pt presents to [facility] with co of right arm pain since last week. Pt reports she woke up with it in the same position she went to sleep in and she noticed pain and soreness. Each day got better but over the last few days she noticed increasing pain and tinglings. Motrin otc.

## 2024-01-03 ENCOUNTER — Encounter (HOSPITAL_BASED_OUTPATIENT_CLINIC_OR_DEPARTMENT_OTHER): Payer: Self-pay | Admitting: Emergency Medicine

## 2024-01-03 ENCOUNTER — Emergency Department (HOSPITAL_BASED_OUTPATIENT_CLINIC_OR_DEPARTMENT_OTHER): Payer: BC Managed Care – PPO

## 2024-01-03 ENCOUNTER — Emergency Department (HOSPITAL_BASED_OUTPATIENT_CLINIC_OR_DEPARTMENT_OTHER)
Admission: EM | Admit: 2024-01-03 | Discharge: 2024-01-03 | Disposition: A | Discharge status: Home / Self Care | Payer: BC Managed Care – PPO | Attending: Emergency Medicine | Admitting: Emergency Medicine

## 2024-01-03 ENCOUNTER — Other Ambulatory Visit (HOSPITAL_BASED_OUTPATIENT_CLINIC_OR_DEPARTMENT_OTHER): Payer: Self-pay

## 2024-01-03 DIAGNOSIS — M25511 Pain in right shoulder: Secondary | ICD-10-CM | POA: Insufficient documentation

## 2024-01-03 DIAGNOSIS — R202 Paresthesia of skin: Secondary | ICD-10-CM | POA: Diagnosis not present

## 2024-01-03 DIAGNOSIS — M79601 Pain in right arm: Secondary | ICD-10-CM | POA: Insufficient documentation

## 2024-01-03 DIAGNOSIS — Z91010 Allergy to peanuts: Secondary | ICD-10-CM | POA: Insufficient documentation

## 2024-01-03 DIAGNOSIS — Z79899 Other long term (current) drug therapy: Secondary | ICD-10-CM | POA: Diagnosis not present

## 2024-01-03 DIAGNOSIS — S4981XA Other specified injuries of right shoulder and upper arm, initial encounter: Secondary | ICD-10-CM | POA: Diagnosis not present

## 2024-01-03 DIAGNOSIS — M25521 Pain in right elbow: Secondary | ICD-10-CM | POA: Diagnosis not present

## 2024-01-03 DIAGNOSIS — M79609 Pain in unspecified limb: Secondary | ICD-10-CM

## 2024-01-03 MED ORDER — OXYCODONE-ACETAMINOPHEN 5-325 MG PO TABS
1.0000 | ORAL_TABLET | Freq: Four times a day (QID) | ORAL | 0 refills | Status: DC | PRN
  Filled 2024-01-03: qty 12, 3d supply, fill #0

## 2024-01-03 NOTE — Discharge Instructions (Addendum)
You are seen in the emergency department for pain in your right shoulder and elbow and some tingling in your right fingers.  X-rays of your shoulder and elbow did not show any fracture or dislocation.  This may be some inflammation in that area pressing on a nerve or possibly even a disc in your neck leading to your symptoms.  Please continue your anti-inflammatories and muscle relaxants.  We are prescribing you a short course of some pain medicine to use for breakthrough pain.  Schedule an appointment with your primary care doctor for further evaluation.  Return if any worsening or concerning symptoms

## 2024-01-03 NOTE — ED Provider Notes (Signed)
[place] [facility] Provider Note   CSN: [id number] Arrival date & time: 01/03/24  0272     History  Chief Complaint  Patient presents with   Arm Injury    Tara Rosales is a 43 y.o. female.  She is right-hand dominant.  She is complaining of pain in her right shoulder and scapula and right elbow radiating down to her fingers with some intermittent tingling in her fingers that is been going on since yesterday.  No known trauma.  She said she had some pain in her shoulder a few weeks ago that she felt was related to her sleeping position.  She saw urgent care yesterday and they prescribed her some ketorolac and a muscle relaxant.  She said it helped somewhat and she has been using heat but had a tough night with the pain.  Worse with movement.  No real neck pain.  The history is provided by the patient.  Arm Injury Location:  Shoulder and elbow Shoulder location:  R shoulder Elbow location:  R elbow Injury: no   Pain details:    Quality:  Aching   Radiates to:  R fingers   Severity:  Severe   Onset quality:  Gradual   Duration:  2 days   Timing:  Constant   Progression:  Unchanged Handedness:  Right-handed Dislocation: no   Prior injury to area:  No Relieved by:  Nothing Worsened by:  Movement Ineffective treatments:  Muscle relaxant, NSAIDs and heat Associated symptoms: tingling   Associated symptoms: no back pain, no fever and no neck pain        Home Medications Prior to Admission medications   Medication Sig Start Date End Date Taking? Authorizing Provider  EPINEPHrine (EPIPEN 2-PAK) 0.3 mg/0.3 mL IJ SOAJ injection Inject 0.3 mg into the muscle as needed for anaphylaxis. 10/16/23   Sharlene Dory, DO  fexofenadine (ALLEGRA ALLERGY) 180 MG tablet Take 1 tablet (180 mg total) by mouth daily for 15 days. 08/16/23 08/31/23  Trevor Iha, FNP  ketorolac (TORADOL) 10 MG tablet Take 1 tablet (10 mg total) by mouth every 6  (six) hours as needed (pain). 01/02/24   Zenia Resides, MD  levothyroxine (SYNTHROID) 75 MCG tablet Take 1 tablet (75 mcg total) by mouth daily. 11/27/23   Sharlene Dory, DO  liothyronine (CYTOMEL) 5 MCG tablet Take 1 tablet (5 mcg total) by mouth 2 (two) times daily. 05/31/23   Sharlene Dory, DO  tiZANidine (ZANAFLEX) 4 MG tablet Take 1 tablet (4 mg total) by mouth every 8 (eight) hours as needed for muscle spasms. 01/02/24   Zenia Resides, MD      Allergies    Penicillins, Sulfa antibiotics, Egg-derived products, Gluten meal, Other, Peanut-containing drug products, and Soy allergy (do not select)    Review of Systems   Review of Systems  Constitutional:  Negative for fever.  Musculoskeletal:  Negative for back pain and neck pain.  Neurological:  Positive for numbness. Negative for weakness.    Physical Exam Updated Vital Signs BP 116/84 (BP Location: Left Arm)   Pulse 71   Temp 98.8 F (37.1 C) (Oral)   Resp 18   Ht 5\' 8"  (1.727 m)   Wt 104.3 kg   SpO2 98%   BMI 34.97 kg/m  Physical Exam Constitutional:      General: She is not in acute distress.    Appearance: Normal appearance. She is well-developed.  HENT:  Head: Normocephalic and atraumatic.  Eyes:     Conjunctiva/sclera: Conjunctivae normal.  Musculoskeletal:        General: Tenderness present. No deformity. Normal range of motion.     Cervical back: Neck supple.     Comments: She has no midline cervical spine tenderness.  She does have some right trapezius and posterior shoulder tenderness.  Normal shoulder internal/external rotation.  Elbow full range of motion.  Wrist full range of motion.  Distal pulses motor and sensation intact.  Cap refill brisk  Skin:    General: Skin is warm and dry.  Neurological:     General: No focal deficit present.     Mental Status: She is alert.     GCS: GCS eye subscore is 4. GCS verbal subscore is 5. GCS motor subscore is 6.     Sensory: No sensory  deficit.     Motor: No weakness.     ED Results / Procedures / Treatments   Labs (all labs ordered are listed, but only abnormal results are displayed) Labs Reviewed - No data to display  EKG None  Radiology DG Elbow Complete Right Result Date: 01/03/2024 CLINICAL DATA:  Right elbow pain.  No known injury. EXAM: RIGHT ELBOW - COMPLETE 3+ VIEW COMPARISON:  None Available. FINDINGS: No acute fracture or dislocation. No aggressive osseous lesion. No significant elbow joint arthritis. No radiopaque foreign bodies. Soft tissues are within normal limits. IMPRESSION: No acute osseous abnormality of the right elbow. Electronically Signed   By: Jules Schick M.D.   On: 01/03/2024 08:12   DG Shoulder Right Result Date: 01/03/2024 CLINICAL DATA:  Right shoulder pain.  No known injury. EXAM: RIGHT SHOULDER - 2+ VIEW COMPARISON:  None Available. FINDINGS: No acute fracture or dislocation. No aggressive osseous lesion. Glenohumeral and acromioclavicular joints are normal in alignment. No significant arthritis. No soft tissue swelling. No radiopaque foreign bodies. IMPRESSION: No acute osseous abnormality of the right shoulder. No significant arthritis. Electronically Signed   By: Jules Schick M.D.   On: 01/03/2024 08:11    Procedures Procedures    Medications Ordered in ED Medications - No data to display  ED Course/ Medical Decision Making/ A&P                                 Medical Decision Making Amount and/or Complexity of Data Reviewed Radiology: ordered.  Risk Prescription drug management.   This patient complains of right arm and shoulder pain, intermittent paresthesias; this involves an extensive number of treatment Options and is a complaint that carries with it a high risk of complications and morbidity. The differential includes radiculopathy, musculoskeletal pain, neuropraxia, fracture, dislocation I ordered imaging studies which included x-rays right shoulder and elbow  and I independently    visualized and interpreted imaging which showed no acute findings Additional history obtained from patient's companion Previous records obtained and reviewed in epic including recent urgent care visits and PCP notes Social determinants considered, patient with social isolation and increased stress Critical Interventions: None  After the interventions stated above, I reevaluated the patient and found patient to be neuro and vascular intact Admission and further testing considered, no indications for admission.  Will provide sling and pain medicine.  Recommended close follow-up with PCP as may need physical therapy or further imaging and workup.  Patient in agreement with plan..         Final Clinical Impression(s) /  ED Diagnoses Final diagnoses:  Right arm pain  Paresthesia and pain of right extremity    Rx / DC Orders ED Discharge Orders          Ordered    oxyCODONE-acetaminophen (PERCOCET/ROXICET) 5-325 MG tablet  Every 6 hours PRN        01/03/24 0826              Terrilee Files, MD 01/03/24 1734

## 2024-01-03 NOTE — ED Triage Notes (Signed)
C/o R shoulder and R elbow pain starting yesterday. Seen at [facility] yesterday and given pain medication but no relief. Dx with pinched nerve. Denies SHOB or CP.

## 2024-01-05 ENCOUNTER — Ambulatory Visit: Payer: BC Managed Care – PPO

## 2024-01-05 ENCOUNTER — Encounter: Payer: Self-pay | Admitting: Family Medicine

## 2024-01-05 ENCOUNTER — Ambulatory Visit: Payer: BC Managed Care – PPO | Admitting: Family Medicine

## 2024-01-05 ENCOUNTER — Other Ambulatory Visit (HOSPITAL_BASED_OUTPATIENT_CLINIC_OR_DEPARTMENT_OTHER): Payer: Self-pay

## 2024-01-05 VITALS — BP 134/74 | HR 73 | Temp 98.0°F | Resp 16 | Ht 68.0 in | Wt 226.8 lb

## 2024-01-05 DIAGNOSIS — G8929 Other chronic pain: Secondary | ICD-10-CM

## 2024-01-05 DIAGNOSIS — M5030 Other cervical disc degeneration, unspecified cervical region: Secondary | ICD-10-CM | POA: Diagnosis not present

## 2024-01-05 DIAGNOSIS — M47812 Spondylosis without myelopathy or radiculopathy, cervical region: Secondary | ICD-10-CM | POA: Diagnosis not present

## 2024-01-05 DIAGNOSIS — M79601 Pain in right arm: Secondary | ICD-10-CM

## 2024-01-05 DIAGNOSIS — M4312 Spondylolisthesis, cervical region: Secondary | ICD-10-CM | POA: Diagnosis not present

## 2024-01-05 MED ORDER — GABAPENTIN 300 MG PO CAPS
300.0000 mg | ORAL_CAPSULE | Freq: Three times a day (TID) | ORAL | 3 refills | Status: DC
  Filled 2024-01-05: qty 90, 30d supply, fill #0

## 2024-01-05 MED ORDER — METHYLPREDNISOLONE ACETATE 80 MG/ML IJ SUSP
80.0000 mg | Freq: Once | INTRAMUSCULAR | Status: AC
  Administered 2024-01-05: 80 mg via INTRAMUSCULAR

## 2024-01-05 NOTE — Progress Notes (Signed)
Musculoskeletal Exam  Patient: Tara Rosales DOB: 1981/09/28  DOS: 01/05/2024  SUBJECTIVE:  Chief Complaint:   Chief Complaint  Patient presents with   Follow-up    Follow up    Travis Purk is a 43 y.o.  female for evaluation and treatment of R arm pain.   Onset:  3 days ago. No inj or change in activity.  Location:  Character:  burning  Progression of issue:  has worsened Associated symptoms: tingling, decreased ROM No bruising, redness, swelling Treatment: to date has been OTC NSAIDS, prescription NSAIDS, TENS unit (did help a little), lidocaine patches, muscle relaxers, and Percocet.   Neurovascular symptoms: no  Past Medical History:  Diagnosis Date   Abnormal uterine bleeding (AUB)    Allergic rhinitis    History of cervical dysplasia    s/p  LEEP  in 2004    in gyn office   Hypothyroidism due to Hashimoto's thyroiditis 09/19/2016   pcp and endocrinologist-- Johnella Moloney NP   Myofascial pain syndrome 10/14/2022   Positive ANA (antinuclear antibody) 10/14/2022   work-up done by rheumologist--- dr s. Corliss Skains   Wears glasses     Objective: VITAL SIGNS: BP 134/74   Pulse 73   Temp 98 F (36.7 C) (Oral)   Resp 16   Ht 5\' 8"  (1.727 m)   Wt 226 lb 12.8 oz (102.9 kg)   SpO2 99%   BMI 34.48 kg/m  Constitutional: Well formed, well developed. No acute distress. Thorax & Lungs: No accessory muscle use Musculoskeletal: Right upper extremity.   Normal active range of motion: no.   Normal passive range of motion: no; decreased secondary to pain Tenderness to palpation: Yes over the deltoid, trapezius, coracoid process, lateral tricep, lateral epicondyle, extensor forearm compartment Deformity: no Ecchymosis: no Tests positive: Neer's, empty can, speeds, crossover Tests negative: Spurling's, liftoff Neurologic: Normal sensory function. No focal deficits noted. DTR's equal and symmetric in UE's. No clonus. Psychiatric: Normal mood. Age appropriate judgment  and insight. Alert & oriented x 3.    Assessment:  Chronic pain of right upper extremity - Plan: DG Cervical Spine Complete, Ambulatory referral to Sports Medicine, gabapentin (NEURONTIN) 300 MG capsule, methylPREDNISolone acetate (DEPO-MEDROL) injection 80 mg  Plan: Stretches/exercises, heat, ice, Tylenol.  Depo-Medrol injection today.  Gabapentin 3 times daily as needed.  Warned about drowsiness with this.  She will start taking it at night first.  Refer to sports medicine.  Check x-ray of cervical spine to rule out stenosis.  Would stop NSAIDs and muscle relaxer given lack of efficacy. F/u as originally scheduled. The patient voiced understanding and agreement to the plan.   Jilda Roche [place], DO 01/05/24  10:55 AM

## 2024-01-05 NOTE — Patient Instructions (Addendum)
Ice/cold pack over area for 10-15 min twice daily.  Heat (pad or rice pillow in microwave) over affected area, 10-15 minutes twice daily.   OK to take Tylenol 1000 mg (2 extra strength tabs) or 975 mg (3 regular strength tabs) every 6 hours as needed.  If you do not hear anything about your referral in the next 1-2 weeks, call our office and ask for an update.  We will be in touch with your imaging results.  Please get your X-ray done at the [facility] in [place]: [address], [place], [place] [address] [phone]  You do not need an appointment for this location.   Let us know if you need anything.  EXERCISES  RANGE OF MOTION (ROM) AND STRETCHING EXERCISES These exercises may help you when beginning to rehabilitate your injury. While completing these exercises, remember:  Restoring tissue flexibility helps normal motion to return to the joints. This allows healthier, less painful movement and activity. An effective stretch should be held for at least 30 seconds. A stretch should never be painful. You should only feel a gentle lengthening or release in the stretched tissue.  ROM - Pendulum Bend at the waist so that your right / left arm falls away from your body. Support yourself with your opposite hand on a solid surface, such as a table or a countertop. Your right / left arm should be perpendicular to the ground. If it is not perpendicular, you need to lean over farther. Relax the muscles in your right / left arm and shoulder as much as possible. Gently sway your hips and trunk so they move your right / left arm without any use of your right / left shoulder muscles. Progress your movements so that your right / left arm moves side to side, then forward and backward, and finally, both clockwise and counterclockwise. Complete 10-15 repetitions in each direction. Many people use this exercise to relieve discomfort in their shoulder as well as to gain range of motion. Repeat  2 times. Complete this exercise 3 times per week.  STRETCH - Flexion, Standing Stand with good posture. With an underhand grip on your right / left hand and an overhand grip on the opposite hand, grasp a broomstick or cane so that your hands are a little more than shoulder-width apart. Keeping your right / left elbow straight and shoulder muscles relaxed, push the stick with your opposite hand to raise your right / left arm in front of your body and then overhead. Raise your arm until you feel a stretch in your right / left shoulder, but before you have increased shoulder pain. Try to avoid shrugging your right / left shoulder as your arm rises by keeping your shoulder blade tucked down and toward your mid-back spine. Hold 30 seconds. Slowly return to the starting position. Repeat 2 times. Complete this exercise 3 times per week.  STRETCH - Internal Rotation Place your right / left hand behind your back, palm-up. Throw a towel or belt over your opposite shoulder. Grasp the towel/belt with your right / left hand. While keeping an upright posture, gently pull up on the towel/belt until you feel a stretch in the front of your right / left shoulder. Avoid shrugging your right / left shoulder as your arm rises by keeping your shoulder blade tucked down and toward your mid-back spine. Hold 30. Release the stretch by lowering your opposite hand. Repeat 2 times. Complete this exercise 3 times per week.  STRETCH -  External Rotation and Abduction Stagger your stance through a doorframe. It does not matter which foot is forward. As instructed by your physician, physical therapist or athletic trainer, place your hands: And forearms above your head and on the door frame. And forearms at head-height and on the door frame. At elbow-height and on the door frame. Keeping your head and chest upright and your stomach muscles tight to prevent over-extending your low-back, slowly shift your weight onto your front  foot until you feel a stretch across your chest and/or in the front of your shoulders. Hold 30 seconds. Shift your weight to your back foot to release the stretch. Repeat 2 times. Complete this stretch 3 times per week.   STRENGTHENING EXERCISES  These exercises may help you when beginning to rehabilitate your injury. They may resolve your symptoms with or without further involvement from your physician, physical therapist or athletic trainer. While completing these exercises, remember:  Muscles can gain both the endurance and the strength needed for everyday activities through controlled exercises. Complete these exercises as instructed by your physician, physical therapist or athletic trainer. Progress the resistance and repetitions only as guided. You may experience muscle soreness or fatigue, but the pain or discomfort you are trying to eliminate should never worsen during these exercises. If this pain does worsen, stop and make certain you are following the directions exactly. If the pain is still present after adjustments, discontinue the exercise until you can discuss the trouble with your clinician. If advised by your physician, during your recovery, avoid activity or exercises which involve actions that place your right / left hand or elbow above your head or behind your back or head. These positions stress the tissues which are trying to heal.  STRENGTH - Scapular Depression and Adduction With good posture, sit on a firm chair. Supported your arms in front of you with pillows, arm rests or a table top. Have your elbows in line with the sides of your body. Gently draw your shoulder blades down and toward your mid-back spine. Gradually increase the tension without tensing the muscles along the top of your shoulders and the back of your neck. Hold for 3 seconds. Slowly release the tension and relax your muscles completely before completing the next repetition. After you have practiced this  exercise, remove the arm support and complete it in standing as well as sitting. Repeat 2 times. Complete this exercise 3 times per week.   STRENGTH - External Rotators Secure a rubber exercise band/tubing to a fixed object so that it is at the same height as your right / left elbow when you are standing or sitting on a firm surface. Stand or sit so that the secured exercise band/tubing is at your side that is not injured. Bend your elbow 90 degrees. Place a folded towel or small pillow under your right / left arm so that your elbow is a few inches away from your side. Keeping the tension on the exercise band/tubing, pull it away from your body, as if pivoting on your elbow. Be sure to keep your body steady so that the movement is only coming from your shoulder rotating. Hold 3 seconds. Release the tension in a controlled manner as you return to the starting position. Repeat 2 times. Complete this exercise 3 times per week.   STRENGTH - Supraspinatus Stand or sit with good posture. Grasp a 2-3 lb weight or an exercise band/tubing so that your hand is "thumbs-up," like when you shake hands.  Slowly lift your right / left hand from your thigh into the air, traveling about 30 degrees from straight out at your side. Lift your hand to shoulder height or as far as you can without increasing any shoulder pain. Initially, many people do not lift their hands above shoulder height. Avoid shrugging your right / left shoulder as your arm rises by keeping your shoulder blade tucked down and toward your mid-back spine. Hold for 3 seconds. Control the descent of your hand as you slowly return to your starting position. Repeat 2 times. Complete this exercise 3 times per week.   STRENGTH - Shoulder Extensors Secure a rubber exercise band/tubing so that it is at the height of your shoulders when you are either standing or sitting on a firm arm-less chair. With a thumbs-up grip, grasp an end of the band/tubing in  each hand. Straighten your elbows and lift your hands straight in front of you at shoulder height. Step back away from the secured end of band/tubing until it becomes tense. Squeezing your shoulder blades together, pull your hands down to the sides of your thighs. Do not allow your hands to go behind you. Hold for 3 seconds. Slowly ease the tension on the band/tubing as you reverse the directions and return to the starting position. Repeat 2 times. Complete this exercise 3 times per week.   STRENGTH - Scapular Retractors Secure a rubber exercise band/tubing so that it is at the height of your shoulders when you are either standing or sitting on a firm arm-less chair. With a palm-down grip, grasp an end of the band/tubing in each hand. Straighten your elbows and lift your hands straight in front of you at shoulder height. Step back away from the secured end of band/tubing until it becomes tense. Squeezing your shoulder blades together, draw your elbows back as you bend them. Keep your upper arm lifted away from your body throughout the exercise. Hold 3 seconds. Slowly ease the tension on the band/tubing as you reverse the directions and return to the starting position. Repeat 2 times. Complete this exercise 3 times per week.  STRENGTH - Scapular Depressors Find a sturdy chair without wheels, such as a from a dining room table. Keeping your feet on the floor, lift your bottom from the seat and lock your elbows. Keeping your elbows straight, allow gravity to pull your body weight down. Your shoulders will rise toward your ears. Raise your body against gravity by drawing your shoulder blades down your back, shortening the distance between your shoulders and ears. Although your feet should always maintain contact with the floor, your feet should progressively support less body weight as you get stronger. Hold 3 seconds. In a controlled and slow manner, lower your body weight to begin the next  repetition. Repeat 2 times. Complete this exercise 3 times per week.    This information is not intended to replace advice given to you by your health care provider. Make sure you discuss any questions you have with your health care provider.   Document Released: 10/05/2005 Document Revised: 12/12/2014 Document Reviewed: 03/05/2009 Elsevier Interactive Patient Education [employer]

## 2024-01-09 ENCOUNTER — Encounter: Payer: Self-pay | Admitting: Family Medicine

## 2024-01-09 NOTE — Telephone Encounter (Signed)
LM at radiology reading room- asking for read on cervical spine x-ray

## 2024-01-10 ENCOUNTER — Encounter: Payer: Self-pay | Admitting: Family Medicine

## 2024-01-10 ENCOUNTER — Ambulatory Visit (INDEPENDENT_AMBULATORY_CARE_PROVIDER_SITE_OTHER): Payer: BC Managed Care – PPO | Admitting: Family Medicine

## 2024-01-10 VITALS — BP 120/80 | Ht 68.0 in | Wt 226.0 lb

## 2024-01-10 DIAGNOSIS — M542 Cervicalgia: Secondary | ICD-10-CM

## 2024-01-10 DIAGNOSIS — M5412 Radiculopathy, cervical region: Secondary | ICD-10-CM | POA: Diagnosis not present

## 2024-01-10 NOTE — Progress Notes (Signed)
 CHIEF COMPLAINT: No chief complaint on file.  _____________________________________________________________ SUBJECTIVE  HPI  Pt is a 43 y.o. female here for evaluation of Right arm pain  Seen 01/05/2024 in PCP office, note reviewed.   -Onset 01/02/2024, no inciting event Woke up and her R arm was kind of sore, thinks she slept a little weird, felt fine, then a couple weeks later, woke up and her arm was sore and tingling. Went to work and noted that throughout the day the pain was increasing. Had to keep shaking and rubbing on her arm.  Went to UC in [place] that day, thought it was a pinched nerve, received pain pill, muscle relaxer, shot, among others. Took a dose at 6pm, 9:30pm going to bed took another dose to make sure she would sleep. Woke up 1 hr later in intense pain. Couldn't feel her hand a few hours later and was in more pain. Tried heating pad with no improvement.  Went to ED again next day 01/03/24, states she couldn't really raise her arm. Got XR, elbow shoulder, didn't show anything. Given oxy, given sling Saw Dr. Frann 01/05/24, shared the following: - burning in nature, progressively worsening, accompanied with tingling and decreased range of motion without bruising, redness, or swelling -Treating with NSAIDs, TENS unit with some benefit, lidocaine  patches, muscle relaxers, Percocet -Physical exam identified abnormal active and passive ROM, tenderness over deltoid, trapezius, coracoid process, lateral tricep, lateral epicondyle, extensor forearm compartment, + Neer's, + empty can, + speeds, + crossover, - Spurling's and liftoff -Cervical spine x-ray were ordered, received Depo-Medrol  injection, counseled on stretches and exercises, heat, ice, Tylenol .  Also started on gabapentin , referred to sports medicine  Cervical spine x-ray independently reviewed, anterolisthesis C4 on C5, decreased disc height loss C5-C6, C6-C7, signs of facet arthropathy, vertebral body  osteophytosis more pronounced C5-C6 > C6-C7 --------  Since her PCP visit she has been doing better, but still symptomatic. Feels that the corticosteroid IM shot really helped calm things down Able to put her necklace on today Able to shampoo her head with both hands Ulnar sided paresthesias including 5th and 4th fingers No recent viral illness During today's visit, confirmed that PCP has referred patient to spine surgery for further evaluation  ------------------------------------------------------------------------------------------------------ Past Medical History:  Diagnosis Date   Abnormal uterine bleeding (AUB)    Allergic rhinitis    History of cervical dysplasia    s/p  LEEP  in 2004    in gyn office   Hypothyroidism due to Hashimoto's thyroiditis 09/19/2016   pcp and endocrinologist-- carmelita longs NP   Myofascial pain syndrome 10/14/2022   Positive ANA (antinuclear antibody) 10/14/2022   work-up done by rheumologist--- dr s. dolphus   Wears glasses     Past Surgical History:  Procedure Laterality Date   BREAST REDUCTION SURGERY Bilateral 1999   CERVICAL BIOPSY  W/ LOOP ELECTRODE EXCISION  2004   DILITATION & CURRETTAGE/HYSTROSCOPY WITH NOVASURE ABLATION N/A 11/16/2022   Procedure: DILATATION & CURETTAGE/HYSTEROSCOPY WITH NOVASURE ABLATION;  Surgeon: Jeralyn Crutch, MD;  Location: [facility];  Service: Gynecology;  Laterality: N/A;   LYMPH NODE DISSECTION  2001   per pt under chin for enlargement,  benign   TONSILLECTOMY AND ADENOIDECTOMY  1992   child   WISDOM TOOTH EXTRACTION  1995      Outpatient Encounter Medications as of 01/10/2024  Medication Sig   EPINEPHrine  (EPIPEN  2-PAK) 0.3 mg/0.3 mL IJ SOAJ injection Inject 0.3 mg into the muscle as needed for anaphylaxis.  fexofenadine  (ALLEGRA  ALLERGY ) 180 MG tablet Take 1 tablet (180 mg total) by mouth daily for 15 days.   gabapentin  (NEURONTIN ) 300 MG capsule Take 1 capsule (300 mg total) by  mouth 3 (three) times daily.   levothyroxine  (SYNTHROID ) 75 MCG tablet Take 1 tablet (75 mcg total) by mouth daily.   liothyronine  (CYTOMEL ) 5 MCG tablet Take 1 tablet (5 mcg total) by mouth 2 (two) times daily.   oxyCODONE -acetaminophen  (PERCOCET/ROXICET) 5-325 MG tablet Take 1 tablet by mouth every 6 (six) hours as needed for severe pain (pain score 7-10).   No facility-administered encounter medications on file as of 01/10/2024.    ------------------------------------------------------------------------------------------------------  _____________________________________________________________ OBJECTIVE  PHYSICAL EXAM  Today's Vitals   01/10/24 1608  BP: 120/80  Weight: 226 lb (102.5 kg)  Height: 5' 8 (1.727 m)   Body mass index is 34.36 kg/m.   reviewed  General: A+Ox3, no acute distress, well-nourished, appropriate affect CV: pulses 2+ regular, nondiaphoretic, no peripheral edema, cap refill <2sec Lungs: no audible wheezing, non-labored breathing, bilateral chest rise/fall, nontachypneic Skin: warm, well-perfused, non-icteric, no susp lesions or rashes Neuro: muscle tone wnl, no atrophy. Diminished light touch sensation ulnar distribution hand and proximal triceps region Psych: no signs of depression or anxiety MSK:  R Shoulder:  No deformity, swelling or muscle wasting No scapular winging FF 180, abd 180, symmetric ER TTP deltoid, subacromial space, trapezius diffusely NTTP over the [place], clavicle, ac, coracoid, biceps groove, humerus, scap spine, cervical spine Neg neer, hawkins, empty can, scarf test, hornblower, resisted anterior flexion, subscap liftoff, speeds, obriens Neg ant drawer, sulcus sign Neg apprehension +Spurling's test Weakened R grip strength, pain with elbow and wrist extension, weakened resisted wrist extension.  _____________________________________________________________ ASSESSMENT/PLAN Diagnoses and all orders for this visit:  Cervical  radiculopathy -     Ambulatory referral to Physical Therapy   XR reviewed with patient. Acute presentation onset 01/02/24 with unilateral RUE and hand pain/paresthesias as well as R sided trapezius pain, slowly improving but with persistent radicular symptoms. Multiple options discussed for symptom management. Awaiting scheduled consultation with spine surgery, referral requested today. Declined additional Rx at this time in favor of current regimen (may continue to utilize NSAIDs, TENS, lidocaine  patches, muscle relaxers, Percocet, heat therapy for comfort); may consider trial gabapentin /neuropathic pain medication. Formal PT referral placed for supportive management. All questions answered. Anticipate follow-up/return to clinic following appt with spine surgery, sooner as needed for refractory, worsening, or new concerns. Patient verbalized understanding and is in agreement with plan.  Electronically signed by: Jevon Littlepage W Beaulah Romanek, MD 01/10/2024 7:12 AM

## 2024-01-12 ENCOUNTER — Other Ambulatory Visit: Payer: Self-pay

## 2024-01-12 DIAGNOSIS — G8929 Other chronic pain: Secondary | ICD-10-CM

## 2024-01-23 ENCOUNTER — Ambulatory Visit (INDEPENDENT_AMBULATORY_CARE_PROVIDER_SITE_OTHER): Payer: BC Managed Care – PPO | Admitting: Orthopedic Surgery

## 2024-01-23 ENCOUNTER — Telehealth: Payer: Self-pay | Admitting: Orthopedic Surgery

## 2024-01-23 ENCOUNTER — Other Ambulatory Visit (HOSPITAL_BASED_OUTPATIENT_CLINIC_OR_DEPARTMENT_OTHER): Payer: Self-pay

## 2024-01-23 ENCOUNTER — Other Ambulatory Visit (INDEPENDENT_AMBULATORY_CARE_PROVIDER_SITE_OTHER): Payer: Self-pay

## 2024-01-23 VITALS — BP 127/83 | HR 70 | Ht 68.0 in | Wt 226.0 lb

## 2024-01-23 DIAGNOSIS — M542 Cervicalgia: Secondary | ICD-10-CM

## 2024-01-23 DIAGNOSIS — F411 Generalized anxiety disorder: Secondary | ICD-10-CM | POA: Diagnosis not present

## 2024-01-23 DIAGNOSIS — M5412 Radiculopathy, cervical region: Secondary | ICD-10-CM | POA: Diagnosis not present

## 2024-01-23 DIAGNOSIS — F331 Major depressive disorder, recurrent, moderate: Secondary | ICD-10-CM | POA: Diagnosis not present

## 2024-01-23 MED ORDER — METHYLPREDNISOLONE 4 MG PO TBPK
ORAL_TABLET | ORAL | 0 refills | Status: DC
  Filled 2024-01-23: qty 21, 6d supply, fill #0

## 2024-01-23 NOTE — Progress Notes (Signed)
Orthopedic Spine Surgery Office Note  Assessment: Patient is a 43 y.o. female with right upper extremity pain.  It starts in her shoulder and goes along the posterior aspect of her arm into the dorsal forearm, suspect radiculopathy   Plan: -Explained that initially conservative treatment is tried as a significant number of patients may experience relief with these treatment modalities. Discussed that the conservative treatments include:  -activity modification  -physical therapy  -over the counter pain medications  -medrol dosepak  -cervical steroid injections -Patient has tried Tylenol, ibuprofen, gabapentin, lidocaine patch, TENS unit, muscle relaxer, intramuscular steroid injection, Percocet -Recommended Medrol Dosepak which was prescribed to her today.  Told her that she could also try PT as an additional nonoperative treatment.  Referral provided to her today -If she is not doing any better at her next visit, we will order an MRI of the cervical spine to evaluate for radiculopathy -Patient should return to office in 3 weeks, x-rays at next visit: none   Patient expressed understanding of the plan and all questions were answered to the patient's satisfaction.   ___________________________________________________________________________   History:  Patient is a 43 y.o. female who presents today for cervical spine.  Patient has had 4 weeks of pain radiating into her right upper extremity.  She feels it starting in the shoulder going along the posterior aspect of her arm into the dorsal forearm and finally into the ulnar 2 digits of her right hand.  She does not have any pain radiating into the left upper extremity.  There was no trauma or injury that preceded the onset of pain.  Pain has been severe in nature.  She feels it with activity and at rest.  She has not found any significant relief with the treatments tried so far.   Weakness: Yes, feels her right hand is weaker particular  with grip strength.  No other weakness noted Difficulty with fine motor skills (e.g., buttoning shirts, handwriting): Denies Symptoms of imbalance: Denies Paresthesias and numbness: Yes, gets numbness and paresthesias in her ulnar forearm and hand.  They are periodic in nature.  No other numbness or paresthesias Bowel or bladder incontinence: Denies Saddle anesthesia: Denies  Treatments tried: Tylenol, ibuprofen, gabapentin, lidocaine patch, TENS unit, muscle relaxer, intramuscular steroid injection, Percocet  Review of systems: Denies fevers and chills, night sweats, unexplained weight loss, history of cancer.  Has had pain that wakes her at night  Past medical history: Hypothyroidism  Allergies: Sulfa antibiotics, penicillin  Past surgical history:  Breast reduction Uterine ablation Tonsillectomy and adenoidectomy Benign lymph node excision  Social history: Denies use of nicotine product (smoking, vaping, patches, smokeless) Alcohol use: Denies Denies recreational drug use   Physical Exam:  BMI of 34.4  General: no acute distress, appears stated age Neurologic: alert, answering questions appropriately, following commands Respiratory: unlabored breathing on room air, symmetric chest rise Psychiatric: appropriate affect, normal cadence to speech   MSK (spine):  -Strength exam      Left  Right Grip strength                5/5  5/5 Interosseus   5/5   5/5 Wrist extension  5/5  5/5 Wrist flexion   5/5  5/5 Elbow flexion   5/5  5/5 Deltoid    5/5  5/5  EHL    5/5  5/5 TA    5/5  5/5 GSC    5/5  5/5 Knee extension  5/5  5/5 Hip flexion  5/5  5/5  -Sensory exam    Sensation intact to light touch in L3-S1 nerve distributions of bilateral lower extremities  Sensation intact to light touch in C5-T1 nerve distributions of bilateral upper extremities  -Brachioradialis DTR: 2/4 on the left, 2/4 on the right -Biceps DTR: 2/4 on the left, 2/4 on the right -Triceps  DTR: 2/4 on the left, 2/4 on the right -Achilles DTR: 2/4 on the left, 2/4 on the right -Patellar tendon DTR: 2/4 on the left, 2/4 on the right  -Spurling: Negative bilaterally -Hoffman sign: Positive on the left, negative on the right -Clonus: No beats bilaterally -Interosseous wasting: None seen -Grip and release test: Negative -Romberg: Negative -Gait: Normal  Left shoulder exam: No pain through range of motion Right shoulder exam: No pain through range of motion, negative Jobe, negative belly press, negative Hawkins, no weakness with external rotation with arm at side  Tinel's at wrist: Negative bilaterally Phalen's at wrist: Negative bilaterally Durkan's: Negative bilaterally  Tinel's at elbow: Negative bilaterally  Imaging: XRs of the cervical spine from 01/23/2024 were independently reviewed and interpreted, showing disc height loss at C5/6 and C6/7.  Grade 1 spondylolisthesis at C4/5 that shifts less than 0.5 mm between flexion and extension views.  No fracture or dislocation seen.   XRs of the cervical spine from 01/05/2024 were independently reviewed and interpreted, showing disc height loss at C5/6 and C6/7. Grade 1 spondylolisthesis at C4/5. No fracture or dislocation seen.    Patient name: Tara Rosales Patient MRN: [id number] Date of visit: 01/23/24

## 2024-01-23 NOTE — Telephone Encounter (Signed)
Pt stated as she was looking over her AVS for today. She noticed her referral was sent to PT at [facility] instead she would like it to be sent to the PT in [place].

## 2024-01-24 ENCOUNTER — Ambulatory Visit: Payer: BC Managed Care – PPO | Admitting: Physical Medicine and Rehabilitation

## 2024-01-31 ENCOUNTER — Encounter: Payer: Self-pay | Admitting: Family Medicine

## 2024-01-31 ENCOUNTER — Other Ambulatory Visit: Payer: Self-pay

## 2024-01-31 ENCOUNTER — Ambulatory Visit (INDEPENDENT_AMBULATORY_CARE_PROVIDER_SITE_OTHER): Payer: BC Managed Care – PPO | Admitting: Family Medicine

## 2024-01-31 VITALS — BP 124/82 | HR 81 | Temp 98.0°F | Resp 16 | Ht 68.0 in | Wt 227.4 lb

## 2024-01-31 DIAGNOSIS — Z0000 Encounter for general adult medical examination without abnormal findings: Secondary | ICD-10-CM | POA: Diagnosis not present

## 2024-01-31 DIAGNOSIS — E039 Hypothyroidism, unspecified: Secondary | ICD-10-CM | POA: Diagnosis not present

## 2024-01-31 DIAGNOSIS — E559 Vitamin D deficiency, unspecified: Secondary | ICD-10-CM

## 2024-01-31 DIAGNOSIS — Z1211 Encounter for screening for malignant neoplasm of colon: Secondary | ICD-10-CM

## 2024-01-31 DIAGNOSIS — R7309 Other abnormal glucose: Secondary | ICD-10-CM

## 2024-01-31 LAB — T4, FREE: Free T4: 0.96 ng/dL (ref 0.60–1.60)

## 2024-01-31 LAB — LIPID PANEL
Cholesterol: 196 mg/dL (ref 0–200)
HDL: 66 mg/dL (ref >39.00)
LDL Cholesterol: 105 mg/dL — ABNORMAL HIGH (ref 0–99)
NonHDL: 130.27
Total CHOL/HDL Ratio: 3
Triglycerides: 125 mg/dL (ref 0.0–149.0)
VLDL: 25 mg/dL (ref 0.0–40.0)

## 2024-01-31 LAB — CBC
HCT: 45.1 % (ref 36.0–46.0)
Hemoglobin: 15.4 g/dL — ABNORMAL HIGH (ref 12.0–15.0)
MCHC: 34.1 g/dL (ref 30.0–36.0)
MCV: 95.9 fL (ref 78.0–100.0)
Platelets: 224 10*3/uL (ref 150.0–400.0)
RBC: 4.7 Mil/uL (ref 3.87–5.11)
RDW: 12.9 % (ref 11.5–15.5)
WBC: 6.5 10*3/uL (ref 4.0–10.5)

## 2024-01-31 LAB — COMPREHENSIVE METABOLIC PANEL
ALT: 15 U/L (ref 0–35)
AST: 12 U/L (ref 0–37)
Albumin: 4.5 g/dL (ref 3.5–5.2)
Alkaline Phosphatase: 60 U/L (ref 39–117)
BUN: 10 mg/dL (ref 6–23)
CO2: 28 meq/L (ref 19–32)
Calcium: 9.4 mg/dL (ref 8.4–10.5)
Chloride: 103 meq/L (ref 96–112)
Creatinine, Ser: 0.64 mg/dL (ref 0.40–1.20)
GFR: 107.18 mL/min (ref >60.00)
Glucose, Bld: 88 mg/dL (ref 70–99)
Potassium: 4.7 meq/L (ref 3.5–5.1)
Sodium: 139 meq/L (ref 135–145)
Total Bilirubin: 0.6 mg/dL (ref 0.2–1.2)
Total Protein: 7 g/dL (ref 6.0–8.3)

## 2024-01-31 LAB — VITAMIN D 25 HYDROXY (VIT D DEFICIENCY, FRACTURES): VITD: 33.3 ng/mL (ref 30.00–100.00)

## 2024-01-31 LAB — TSH: TSH: 0.82 u[IU]/mL (ref 0.35–5.50)

## 2024-01-31 NOTE — Patient Instructions (Addendum)
 Give [place] 2-3 business days to get the results of your labs back.   Keep the diet clean and stay active.  Please get me a copy of your advanced directive form at your convenience.   Let us know if you need anything.

## 2024-01-31 NOTE — Progress Notes (Signed)
 Chief Complaint  Patient presents with   Annual Exam    Annual Exam     Well Woman Burnie Hank is here for a complete physical.   Her last physical was >1 year ago.  Current diet: in general, diet is fair. Current exercise: none. Weight is stable and she denies fatigue out of ordinary. Seatbelt? Yes Advanced directive? No  Health Maintenance Pap/HPV- may be due Mammogram- Yes Tetanus- Yes Hep C screening- Yes HIV screening- Yes  Past Medical History:  Diagnosis Date   Abnormal uterine bleeding (AUB)    Allergic rhinitis    History of cervical dysplasia    s/p  LEEP  in 2004    in gyn office   Hypothyroidism due to Hashimoto's thyroiditis 09/19/2016   pcp and endocrinologist-- Johnella Moloney NP   Myofascial pain syndrome 10/14/2022   Positive ANA (antinuclear antibody) 10/14/2022   work-up done by rheumologist--- dr s. Corliss Skains   Wears glasses      Past Surgical History:  Procedure Laterality Date   BREAST REDUCTION SURGERY Bilateral 1999   CERVICAL BIOPSY  W/ LOOP ELECTRODE EXCISION  2004   DILITATION & CURRETTAGE/HYSTROSCOPY WITH NOVASURE ABLATION N/A 11/16/2022   Procedure: DILATATION & CURETTAGE/HYSTEROSCOPY WITH NOVASURE ABLATION;  Surgeon: Lorriane Shire, MD;  Location:  [facility];  Service: Gynecology;  Laterality: N/A;   LYMPH NODE DISSECTION  2001   per pt under chin for enlargement,  benign   TONSILLECTOMY AND ADENOIDECTOMY  1992   child   WISDOM TOOTH EXTRACTION  1995    Medications  Current Outpatient Medications on File Prior to Visit  Medication Sig Dispense Refill   EPINEPHrine (EPIPEN 2-PAK) 0.3 mg/0.3 mL IJ SOAJ injection Inject 0.3 mg into the muscle as needed for anaphylaxis. 2 each 2   gabapentin (NEURONTIN) 300 MG capsule Take 1 capsule (300 mg total) by mouth 3 (three) times daily. 90 capsule 3   levothyroxine (SYNTHROID) 75 MCG tablet Take 1 tablet (75 mcg total) by mouth daily. 90 tablet 2   liothyronine (CYTOMEL)  5 MCG tablet Take 1 tablet (5 mcg total) by mouth 2 (two) times daily. 180 tablet 2    Allergies Allergies  Allergen Reactions   Penicillins Hives    States can take amoxicillin   Sulfa Antibiotics Hives    Intense Hives   Egg-Derived Products Diarrhea, Nausea And Vomiting and Rash   Gluten Meal Diarrhea, Nausea And Vomiting and Rash   Other Diarrhea, Nausea And Vomiting and Rash    Dairy products   Peanut-Containing Drug Products Diarrhea, Nausea And Vomiting and Rash    Per pt only peanuts,  tree nuts okay   Soy Allergy (Obsolete) Diarrhea, Nausea And Vomiting and Rash    Review of Systems: Constitutional:  no unexpected weight changes Eye:  no recent significant change in vision Ear/Nose/Mouth/Throat:  Ears:  no recent change in hearing Nose/Mouth/Throat:  no complaints of nasal congestion, no sore throat Cardiovascular: no chest pain Respiratory:  no shortness of breath Gastrointestinal:  no abdominal pain, no change in bowel habits GU:  Female: negative for dysuria or pelvic pain Musculoskeletal/Extremities:  no new pain of the joints Integumentary (Skin/Breast):  no abnormal skin lesions reported Neurologic:  no headaches Endocrine:  denies fatigue Hematologic/Lymphatic:  No areas of easy bleeding  Exam BP 124/82 (BP Location: Left Arm, Patient Position: Sitting)   Pulse 81   Temp 98 F (36.7 C) (Oral)   Resp 16   Ht 5\' 8"  (1.727 m)  Wt 227 lb 6.4 oz (103.1 kg)   SpO2 98%   BMI 34.58 kg/m  General:  well developed, well nourished, in no apparent distress Skin:  no significant moles, warts, or growths Head:  no masses, lesions, or tenderness Eyes:  pupils equal and round, sclera anicteric without injection Ears:  canals without lesions, TMs shiny without retraction, no obvious effusion, no erythema Nose:  nares patent, mucosa normal, and no drainage Throat/Pharynx:  lips and gingiva without lesion; tongue and uvula midline; non-inflamed pharynx; no exudates or  postnasal drainage Neck: neck supple without adenopathy, thyromegaly, or masses Lungs:  clear to auscultation, breath sounds equal bilaterally, no respiratory distress Cardio:  regular rate and rhythm, no LE edema Abdomen:  abdomen soft, nontender; bowel sounds normal; no masses or organomegaly Genital: Defer to GYN Musculoskeletal:  symmetrical muscle groups noted without atrophy or deformity Extremities:  no clubbing, cyanosis, or edema, no deformities, no skin discoloration Neuro:  gait normal; deep tendon reflexes normal and symmetric Psych: well oriented with normal range of affect and appropriate judgment/insight  Assessment and Plan  Well adult exam - Plan: CBC, Comprehensive metabolic panel, Lipid panel  Hypothyroidism, unspecified type - Plan: TSH, T4, free  Vitamin D deficiency - Plan: VITAMIN D 25 Hydroxy (Vit-D Deficiency, Fractures)   Well 43 y.o. female. Counseled on diet and exercise. Advanced directive form provided today.  She may need her pap, we will make sure.  Other orders as above. Follow up in 6 mo. The patient voiced understanding and agreement to the plan.  Jilda Roche [place], DO 01/31/24 9:59 AM

## 2024-02-05 NOTE — Therapy (Signed)
 OUTPATIENT PHYSICAL THERAPY CERVICAL EVALUATION   Patient Name: Tara Rosales MRN: [id number] DOB:12-08-1980, 43 y.o., female Today's Date: 02/13/2024   END OF SESSION:  PT End of Session - 02/13/24 0805     Visit Number 1    Date for PT Re-Evaluation 04/09/24    Authorization Type BCBS    PT Start Time 0805    PT Stop Time 0855    PT Time Calculation (min) 50 min    Activity Tolerance Patient tolerated treatment well    Behavior During Therapy [facility] for tasks assessed/performed             Past Medical History:  Diagnosis Date   Abnormal uterine bleeding (AUB)    Allergic rhinitis    History of cervical dysplasia    s/p  LEEP  in 2004    in gyn office   Hypothyroidism due to Hashimoto's thyroiditis 09/19/2016   pcp and endocrinologist-- Johnella Moloney NP   Myofascial pain syndrome 10/14/2022   Positive ANA (antinuclear antibody) 10/14/2022   work-up done by rheumologist--- dr s. Corliss Skains   Wears glasses    Past Surgical History:  Procedure Laterality Date   BREAST REDUCTION SURGERY Bilateral 1999   CERVICAL BIOPSY  W/ LOOP ELECTRODE EXCISION  2004   DILITATION & CURRETTAGE/HYSTROSCOPY WITH NOVASURE ABLATION N/A 11/16/2022   Procedure: DILATATION & CURETTAGE/HYSTEROSCOPY WITH NOVASURE ABLATION;  Surgeon: Lorriane Shire, MD;  Location: [facility];  Service: Gynecology;  Laterality: N/A;   LYMPH NODE DISSECTION  2001   per pt under chin for enlargement,  benign   TONSILLECTOMY AND ADENOIDECTOMY  1992   child   WISDOM TOOTH EXTRACTION  1995   Patient Active Problem List   Diagnosis Date Noted   Abnormal uterine bleeding 11/16/2022   Hypothyroidism 09/19/2016   Vitamin D deficiency 03/26/2015   Abnormal Pap smear of cervix 07/24/2014    Class: History of   Asthma 05/07/2011    PCP: Sharlene Dory, DO   REFERRING PROVIDER: London Sheer, MD   REFERRING DIAG: [phone] (ICD-10-CM) - Radiculopathy, cervical region   THERAPY  DIAG:  Radiculopathy, cervical region  Abnormal posture  Muscle weakness (generalized)  Other muscle spasm  RATIONALE FOR EVALUATION AND TREATMENT: Rehabilitation  ONSET DATE: 01/02/24  NEXT MD VISIT: 02/14/24   SUBJECTIVE:                                                                                                                                                                                                         SUBJECTIVE STATEMENT: Pt  reports she woke up 1 morning in the end of January with tingling in her R arm which became more painful as the day progressed.  Went to urgent care and was told she had a pinched nerve - given pain shot and muscle relaxants but pain continued to worsen.  Went to ED the next day and given oxy but pain still continued to worsen.  F/u with PCP, where she was given cortisone injection and sent for cervical x-rays.  Pain better since steroid injection but still had constant numbness in ulnar distribution. Given a steroid pack 2 weeks ago and now numbness more intermittent.  Notes increased fatigue with all activities using R UE.  Currently unable to sleep except sitting up with pillow under R UE.  Hand dominance: Right  PAIN: Are you having pain? Yes: NPRS scale: 5/10 currently, up to 7-8/10 Pain location: R upper/posterior shoulder and down posterolateral R UE to elbow, numbness into 4th and 5th digits Pain description: radiating, slight throbbing Aggravating factors: typing, using computer mouse, washing hair, lying down to sleep Relieving factors: steroids, otherwise not much relief from meds; ice or heat; theragun  PERTINENT HISTORY:  Hypothyroidism due to Hashimoto's thyroiditis, myofascial pain syndrome, breast reduction surgery, asthma  PRECAUTIONS: None  RED FLAGS: None  HAND DOMINANCE: Right  WEIGHT BEARING RESTRICTIONS: No  FALLS:  Has patient fallen in last 6 months? 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  Grade 1 spondylolisthesis at C4/5 that shifts less than 0.5 mm between flexion and extension views.  No fracture or dislocation seen.   01/05/24 - DG cervical spine: IMPRESSION: Multilevel degenerative changes of the cervical spine, worst at C5-C6 and C6-C7.  Per Dr Cherre Robins, anterolisthesis C4 on C5, decreased disc height loss C5-C6, C6-C7, signs of facet arthropathy, vertebral body osteophytosis more pronounced C5-C6 > C6-C7.   14-Jan-2024 - DG right shoulder: IMPRESSION: No acute osseous abnormality of the right shoulder. No significant arthritis.  01/14/2024 - DG right elbow: IMPRESSION: No acute osseous abnormality of the right elbow.   PATIENT SURVEYS:  NDI 15 / 50 = 30.0 % Quick Dash 56.8 / 100 = 56.8 %  COGNITION: Overall cognitive status: Within functional limits for tasks assessed  SENSATION: Periodic numbness and paresthesias in her ulnar forearm and hand.  POSTURE:  rounded shoulders, forward head, and mildly protracted R shoulder  PALPATION: Increased muscle tension and TTP in R>L UT, LS and medial periscapular muscles   CERVICAL ROM:   Active ROM Eval  Flexion 30 p! into upper back  Extension 53  Right lateral flexion 19 p! R upper shoulder  Left lateral flexion 20 p! R upper shoulder  Right rotation 58  Left rotation 54   (Blank rows = not tested)  UPPER EXTREMITY ROM:  Active ROM Right eval Left eval  Shoulder flexion 112 p!  141  Shoulder extension 34 42  Shoulder abduction 156 162  Shoulder adduction    Shoulder internal rotation FIR [facility] FIR [facility]  Shoulder external rotation FER - unable FER WFL  Elbow flexion    Elbow extension    Wrist flexion    Wrist extension    Wrist ulnar deviation    Wrist radial deviation    Wrist pronation    Wrist supination     (Blank rows = not tested)  UPPER EXTREMITY MMT:  MMT Right eval Left eval  Shoulder flexion 4 5  Shoulder extension 4 4+  Shoulder abduction 4+ 5  Shoulder adduction    Shoulder internal rotation 4+ 5  Shoulder external rotation 4 4+  Middle trapezius 4- 4  Lower trapezius 3- 3  Elbow flexion    Elbow extension    Wrist flexion    Wrist extension    Wrist ulnar deviation    Wrist radial deviation    Wrist pronation    Wrist supination    Grip strength 31# 37.33#   (Blank rows = not tested)  CERVICAL SPECIAL TESTS:  Spurling's test: Negative and Distraction test: Negative - slight reduction in R UE numbness and tingling with manual cervical distraction   TODAY'S TREATMENT:   02/13/2024 - Eval SELF CARE:  Reviewed eval findings and role of PT in addressing identified deficits as well as instruction in initial HEP (see below).  Also provided education on neutral posture/spinal alignment including ideal sleeping posture and use of pillows to maintain neutral spinal alignment.   PATIENT EDUCATION:  Education details: PT eval findings, anticipated POC, initial HEP, and postural awareness  Person educated: Patient Education method: Explanation, Demonstration, Verbal cues, and Handouts Education comprehension: verbalized understanding, returned demonstration, verbal cues required, and needs further education  HOME EXERCISE PROGRAM: Access Code: [email or web address] URL: [email or web address] Date: 02/13/2024 Prepared by: Glenetta Hew  Exercises - Supine Thoracic Mobilization Towel Roll Vertical with Arm Stretch  - 1-2 x daily - 7 x  weekly - 2 sets - 10 reps - 3 sec hold - Seated Cervical Retraction  - 2-3 x daily - 7 x weekly - 2 sets - 10 reps - 3-5 sec hold - Seated Scapular Retraction  - 2-3 x daily - 7 x weekly - 2 sets - 10 reps - 5 sec hold - Ulnar Nerve Glide- Full Arm  - 1 x daily - 7 x weekly - 2 sets - 10 reps - 3 sec hold - Standing Ulnar Nerve Glide  - 1 x daily - 7 x weekly - 2 sets - 10 reps - 3 sec hold   ASSESSMENT:  CLINICAL IMPRESSION: Tara Rosales is a 43 y.o. female who was referred to physical therapy for evaluation and treatment for cervical radiculopathy.  Patient reports onset of R UE pain, numbness and tingling beginning 01/02/2024 without known MOI.  Pain is worse with typing, using computer mouse, washing hair, and lying down to sleep.  Patient has deficits in cervical and R UE ROM, cervical muscle flexibility, R UE/grip and B scapular strength, abnormal posture, and TTP with abnormal muscle tension which are interfering with ADLs and are impacting quality of life.  On NDI patient scored 15/50 demonstrating 30% or moderate disability.  On QuickDASH patient scored 56.8/100 demonstrating 57% disability.  Tara Rosales will benefit from skilled PT to address above deficits to improve mobility and activity tolerance with decreased pain interference.  OBJECTIVE IMPAIRMENTS: decreased activity tolerance, decreased knowledge of condition, decreased ROM, decreased strength, hypomobility, increased fascial restrictions, impaired perceived functional ability, increased muscle spasms, impaired flexibility, impaired sensation, impaired UE functional use, improper body mechanics, postural dysfunction,  and pain.   ACTIVITY LIMITATIONS: carrying, lifting, sleeping, bathing, dressing, reach over head, and hygiene/grooming  PARTICIPATION LIMITATIONS: meal prep, cleaning, laundry, driving, shopping, community activity, and occupation  PERSONAL FACTORS: Past/current experiences, Time since onset of  injury/illness/exacerbation, and 3+ comorbidities: Hypothyroidism due to Hashimoto's thyroiditis, myofascial pain syndrome, breast reduction surgery, asthma  are also affecting patient's functional outcome.   REHAB POTENTIAL: Good  CLINICAL DECISION MAKING: Evolving/moderate complexity  EVALUATION COMPLEXITY: Moderate   GOALS: Goals reviewed with patient? Yes  SHORT TERM GOALS: Target date: 03/12/2024  Patient will be independent with initial HEP to improve outcomes and carryover.  Baseline:  Goal status: INITIAL  2.  Patient will report 25% improvement in neck pain and R UE radiculopathy to improve QOL.  Baseline: 5/10 on eval, up to 7-8/10 Goal status: INITIAL  LONG TERM GOALS: Target date: 04/09/2024  Patient will be independent with ongoing/advanced HEP for self-management at home.  Baseline:  Goal status: INITIAL  2.  Patient will demonstrate improved posture to decrease muscle imbalance. Baseline:  Goal status: INITIAL  3.  Patient will report 50-75% improvement in neck pain and RUE radiculopathy to improve QOL.  Baseline: 5/10 on eval, up to 7-8/10 Goal status: INITIAL  4.  Patient will demonstrate full pain free cervical ROM for safety with driving.   Baseline: Refer to above cervical ROM table Goal status: INITIAL   5.  Patient will demonstrate full pain free R shoulder ROM for increased independence with grooming and self-care.  Baseline: Refer to above UE ROM table Goal status: INITIAL  6. Patient will demonstrate improved R shoulder and scapular strength to >/= 4+/5 with R grip strength symmetrical to or greater than L for functional UE use. Baseline: Refer to above UE MMT table Goal status: INITIAL  7.  Patient will be able to resume normal sleeping position without sleep disturbance due to pain or R UE radiculopathy. Baseline: Currently having to sleep upright with R UE supported on pillow Goal status: INITIAL   8.  Patient will report </= 15% on NDI to  demonstrate improved functional ability.  Baseline: 15 / 50 = 30.0 % Goal status: INITIAL  9.  Patient will </= 43% on QuickDASH to demonstrate improved functional ability   Baseline: 56.8 / 100 = 56.8 % Goal status: INITIAL    PLAN:  PT FREQUENCY: 2x/week  PT DURATION: 8 weeks  PLANNED INTERVENTIONS: 97164- PT Re-evaluation, 97110-Therapeutic exercises, 97530- Therapeutic activity, 97112- Neuromuscular re-education, 97535- Self Care, 81191- Manual therapy, [id number]- Electrical stimulation (unattended), 97035- Ultrasound, 47829- Traction (mechanical), [id number]- Ionotophoresis 4mg /ml Dexamethasone, Patient/Family education, Taping, Dry Needling, Joint mobilization, Spinal mobilization, Cryotherapy, and Moist heat  PLAN FOR NEXT SESSION: Review initial HEP; progress postural strengthening and stretching; MT +/- TPDN to address abnormal muscle tension in neck and upper shoulders; modalities including possible cervical traction and/or [place]   Marry Guan, PT 02/13/2024, 6:13 PM

## 2024-02-07 ENCOUNTER — Other Ambulatory Visit (HOSPITAL_BASED_OUTPATIENT_CLINIC_OR_DEPARTMENT_OTHER): Payer: Self-pay

## 2024-02-07 ENCOUNTER — Encounter: Payer: Self-pay | Admitting: Orthopedic Surgery

## 2024-02-07 MED ORDER — PREGABALIN 75 MG PO CAPS
75.0000 mg | ORAL_CAPSULE | Freq: Two times a day (BID) | ORAL | 1 refills | Status: DC
  Filled 2024-02-07: qty 60, 30d supply, fill #0
  Filled 2024-03-11: qty 60, 30d supply, fill #1

## 2024-02-12 DIAGNOSIS — F411 Generalized anxiety disorder: Secondary | ICD-10-CM | POA: Diagnosis not present

## 2024-02-12 DIAGNOSIS — F331 Major depressive disorder, recurrent, moderate: Secondary | ICD-10-CM | POA: Diagnosis not present

## 2024-02-13 ENCOUNTER — Other Ambulatory Visit: Payer: Self-pay

## 2024-02-13 ENCOUNTER — Encounter: Payer: Self-pay | Admitting: Family Medicine

## 2024-02-13 ENCOUNTER — Other Ambulatory Visit (INDEPENDENT_AMBULATORY_CARE_PROVIDER_SITE_OTHER)

## 2024-02-13 ENCOUNTER — Ambulatory Visit: Payer: BC Managed Care – PPO | Attending: Orthopedic Surgery | Admitting: Physical Therapy

## 2024-02-13 ENCOUNTER — Encounter: Payer: Self-pay | Admitting: Physical Therapy

## 2024-02-13 DIAGNOSIS — R7309 Other abnormal glucose: Secondary | ICD-10-CM

## 2024-02-13 DIAGNOSIS — M62838 Other muscle spasm: Secondary | ICD-10-CM

## 2024-02-13 DIAGNOSIS — M5412 Radiculopathy, cervical region: Secondary | ICD-10-CM

## 2024-02-13 DIAGNOSIS — M6281 Muscle weakness (generalized): Secondary | ICD-10-CM

## 2024-02-13 DIAGNOSIS — R293 Abnormal posture: Secondary | ICD-10-CM | POA: Diagnosis not present

## 2024-02-13 LAB — CBC WITH DIFFERENTIAL/PLATELET
Basophils Absolute: 0 10*3/uL (ref 0.0–0.1)
Basophils Relative: 0.5 % (ref 0.0–3.0)
Eosinophils Absolute: 0.2 10*3/uL (ref 0.0–0.7)
Eosinophils Relative: 3.9 % (ref 0.0–5.0)
HCT: 44.2 % (ref 36.0–46.0)
Hemoglobin: 15 g/dL (ref 12.0–15.0)
Lymphocytes Relative: 21.9 % (ref 12.0–46.0)
Lymphs Abs: 1.3 10*3/uL (ref 0.7–4.0)
MCHC: 33.9 g/dL (ref 30.0–36.0)
MCV: 95.9 fl (ref 78.0–100.0)
Monocytes Absolute: 0.5 10*3/uL (ref 0.1–1.0)
Monocytes Relative: 8.4 % (ref 3.0–12.0)
Neutro Abs: 4 10*3/uL (ref 1.4–7.7)
Neutrophils Relative %: 65.3 % (ref 43.0–77.0)
Platelets: 210 10*3/uL (ref 150.0–400.0)
RBC: 4.6 Mil/uL (ref 3.87–5.11)
RDW: 12.4 % (ref 11.5–15.5)
WBC: 6.1 10*3/uL (ref 4.0–10.5)

## 2024-02-14 ENCOUNTER — Other Ambulatory Visit: Payer: BC Managed Care – PPO

## 2024-02-14 ENCOUNTER — Ambulatory Visit (INDEPENDENT_AMBULATORY_CARE_PROVIDER_SITE_OTHER): Payer: BC Managed Care – PPO | Admitting: Orthopedic Surgery

## 2024-02-14 DIAGNOSIS — M5412 Radiculopathy, cervical region: Secondary | ICD-10-CM

## 2024-02-14 LAB — ERYTHROPOIETIN: Erythropoietin: 15.2 m[IU]/mL (ref 2.6–18.5)

## 2024-02-14 NOTE — Progress Notes (Signed)
 Orthopedic Spine Surgery Office Note   Assessment: Patient is a 43 y.o. female with right upper extremity pain.  It starts in her shoulder and goes along the posterior aspect of her arm into the dorsal forearm, suspect radiculopathy     Plan: -Patient has tried PT, Tylenol, ibuprofen, gabapentin, lidocaine patch, TENS unit, muscle relaxer, intramuscular steroid injection, mderol dose pak, Percocet -Patient has now tried over six weeks of treatment without any relief, so ordered an MRI to evaluate for radiculopathy -Will likely talk about injection as another non-operative treatment to try -Patient should return to office in 8 weeks, x-rays at next visit: none     Patient expressed understanding of the plan and all questions were answered to the patient's satisfaction.    ___________________________________________________________________________     History:   Patient is a 43 y.o. female who presents today for follow up on her cervical spine.  Patient has had about 8 weeks of pain that radiates down her right upper extremity. She feels it starts in the shoulder region going into the posterior aspect of the arm and into the dorsal forearm. She feels it radiating into the ulnar 2 digits on the right hand. No pain radiating into the left upper extremity. She has started PT since she was last seen but has not noticed any change in her symptoms yet. The medrol dose pak was helpful while she was taking it but then symptoms returned as soon as it was completed. She has developed pain in the base of her neck since she was last seen, but has not otherwise noticed any new symptoms.   Treatments tried: PT, Tylenol, ibuprofen, gabapentin, lidocaine patch, TENS unit, muscle relaxer, intramuscular steroid injection, mderol dose pak, Percocet    Physical Exam:   General: no acute distress, appears stated age Neurologic: alert, answering questions appropriately, following commands Respiratory: unlabored  breathing on room air, symmetric chest rise Psychiatric: appropriate affect, normal cadence to speech     MSK (spine):   -Strength exam                                                   Left                  Right Grip strength                5/5                  5/5 Interosseus                  5/5                  5/5 Wrist extension            5/5                  5/5 Wrist flexion                 5/5                  5/5 Elbow flexion                5/5                  5/5 Deltoid  5/5                  5/5   EHL                              5/5                  5/5 TA                                 5/5                  5/5 GSC                             5/5                  5/5 Knee extension            5/5                  5/5 Hip flexion                    5/5                  5/5   -Sensory exam                           Sensation intact to light touch in L2-S1 nerve distributions of bilateral lower extremities             Sensation intact to light touch in C4-T1 nerve distributions of bilateral upper extremities   -Brachioradialis DTR: 2/4 on the left, 2/4 on the right -Biceps DTR: 2/4 on the left, 2/4 on the right -Triceps DTR: 2/4 on the left, 2/4 on the right -Achilles DTR: 2/4 on the left, 2/4 on the right -Patellar tendon DTR: 2/4 on the left, 2/4 on the right   -Spurling: Negative bilaterally -Hoffman sign: Positive on the left, negative on the right -Clonus: No beats bilaterally -Interosseous wasting: None seen -Grip and release test: Negative -Romberg: Negative -Gait: Normal   Left shoulder exam: No pain through range of motion Right shoulder exam: No pain through range of motion, negative Jobe, negative belly press, negative Hawkins, no weakness with external rotation with arm at side   Tinel's at wrist: Negative bilaterally Phalen's at wrist: Negative bilaterally Durkan's: Negative bilaterally   Tinel's at elbow: Negative  bilaterally   Imaging: XRs of the cervical spine from 01/23/2024 were previously independently reviewed and interpreted, showing disc height loss at C5/6 and C6/7.  Grade 1 spondylolisthesis at C4/5 that shifts less than 0.5 mm between flexion and extension views.  No fracture or dislocation seen.    XRs of the cervical spine from 01/05/2024 were previously independently reviewed and interpreted, showing disc height loss at C5/6 and C6/7. Grade 1 spondylolisthesis at C4/5. No fracture or dislocation seen.      Patient name: Tara Rosales Patient MRN: [id number] Date of visit: 02/14/24

## 2024-02-19 ENCOUNTER — Encounter: Payer: Self-pay | Admitting: Internal Medicine

## 2024-02-20 ENCOUNTER — Other Ambulatory Visit (HOSPITAL_BASED_OUTPATIENT_CLINIC_OR_DEPARTMENT_OTHER): Payer: Self-pay | Admitting: Family Medicine

## 2024-02-20 ENCOUNTER — Ambulatory Visit

## 2024-02-20 DIAGNOSIS — M6281 Muscle weakness (generalized): Secondary | ICD-10-CM

## 2024-02-20 DIAGNOSIS — M62838 Other muscle spasm: Secondary | ICD-10-CM

## 2024-02-20 DIAGNOSIS — M5412 Radiculopathy, cervical region: Secondary | ICD-10-CM

## 2024-02-20 DIAGNOSIS — R293 Abnormal posture: Secondary | ICD-10-CM | POA: Diagnosis not present

## 2024-02-20 DIAGNOSIS — Z139 Encounter for screening, unspecified: Secondary | ICD-10-CM

## 2024-02-20 NOTE — Therapy (Signed)
 OUTPATIENT PHYSICAL THERAPY CERVICAL TREATMENT   Patient Name: Tara Rosales MRN: [id number] DOB:05-Jul-1981, 43 y.o., female Today's Date: 02/20/2024   END OF SESSION:  PT End of Session - 02/20/24 1623     Visit Number 2    Date for PT Re-Evaluation 04/09/24    Authorization Type [employer]    PT Start Time 1615    PT Stop Time 1700    PT Time Calculation (min) 45 min    Activity Tolerance Patient tolerated treatment well    Behavior During Therapy [facility] for tasks assessed/performed              Past Medical History:  Diagnosis Date   Abnormal uterine bleeding (AUB)    Allergic rhinitis    History of cervical dysplasia    s/p  LEEP  in 2004    in gyn office   Hypothyroidism due to Hashimoto's thyroiditis 09/19/2016   pcp and endocrinologist-- Johnella Moloney NP   Myofascial pain syndrome 10/14/2022   Positive ANA (antinuclear antibody) 10/14/2022   work-up done by rheumologist--- dr s. Corliss Skains   Wears glasses    Past Surgical History:  Procedure Laterality Date   BREAST REDUCTION SURGERY Bilateral 1999   CERVICAL BIOPSY  W/ LOOP ELECTRODE EXCISION  2004   DILITATION & CURRETTAGE/HYSTROSCOPY WITH NOVASURE ABLATION N/A 11/16/2022   Procedure: DILATATION & CURETTAGE/HYSTEROSCOPY WITH NOVASURE ABLATION;  Surgeon: Lorriane Shire, MD;  Location:  SURGERY C[facility];  Service: Gynecology;  Laterality: N/A;   LYMPH NODE DISSECTION  2001   per pt under chin for enlargement,  benign   TONSILLECTOMY AND ADENOIDECTOMY  1992   child   WISDOM TOOTH EXTRACTION  1995   Patient Active Problem List   Diagnosis Date Noted   Abnormal uterine bleeding 11/16/2022   Hypothyroidism 09/19/2016   Vitamin D deficiency 03/26/2015   Abnormal Pap smear of cervix 07/24/2014    Class: History of   Asthma 05/07/2011    PCP: Sharlene Dory, DO   REFERRING PROVIDER: London Sheer, MD   REFERRING DIAG: [phone] (ICD-10-CM) - Radiculopathy, cervical region   THERAPY  DIAG:  Radiculopathy, cervical region  Abnormal posture  Muscle weakness (generalized)  Other muscle spasm  RATIONALE FOR EVALUATION AND TREATMENT: Rehabilitation  ONSET DATE: 01/02/24  NEXT MD VISIT: 02/14/24   SUBJECTIVE:                                                                                                                                                                                                         SUBJECTIVE STATEMENT:  02/20/24- pt reports she has been driving most of the day. She has been doing HEP, which she reports has helped with her N/T in fingers IE: Pt reports she woke up 1 morning in the end of January with tingling in her R arm which became more painful as the day progressed.  Went to urgent care and was told she had a pinched nerve - given pain shot and muscle relaxants but pain continued to worsen.  Went to ED the next day and given oxy but pain still continued to worsen.  F/u with PCP, where she was given cortisone injection and sent for cervical x-rays.  Pain better since steroid injection but still had constant numbness in ulnar distribution. Given a steroid pack 2 weeks ago and now numbness more intermittent.  Notes increased fatigue with all activities using R UE.  Currently unable to sleep except sitting up with pillow under R UE.  Hand dominance: Right  PAIN: Are you having pain? Yes: NPRS scale: 5/10 Pain location: R upper/posterior shoulder and down posterolateral R UE to elbow, numbness into 4th and 5th digits Pain description: radiating, slight throbbing Aggravating factors: typing, using computer mouse, washing hair, lying down to sleep Relieving factors: steroids, otherwise not much relief from meds; ice or heat; theragun  PERTINENT HISTORY:  Hypothyroidism due to Hashimoto's thyroiditis, myofascial pain syndrome, breast reduction surgery, asthma  PRECAUTIONS: None  RED FLAGS: None  HAND DOMINANCE: Right  WEIGHT BEARING  RESTRICTIONS: No  FALLS:  Has patient fallen in last 6 months? 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  Grade 1 spondylolisthesis at C4/5 that shifts less than 0.5 mm between flexion and extension views.  No fracture or dislocation seen.   01/05/24 - DG cervical spine: IMPRESSION: Multilevel degenerative changes of the cervical spine, worst at C5-C6 and C6-C7.  Per Dr Cherre Robins, anterolisthesis C4 on C5, decreased disc height loss C5-C6, C6-C7, signs of facet arthropathy, vertebral body osteophytosis more pronounced C5-C6 > C6-C7.   21-Jan-2024 - DG right shoulder: IMPRESSION: No acute osseous abnormality of the right shoulder. No significant arthritis.  2024/01/21 - DG right elbow: IMPRESSION: No acute osseous abnormality of the right elbow.   PATIENT SURVEYS:  NDI 15 / 50 = 30.0 % Quick Dash 56.8 / 100 = 56.8 %  COGNITION: Overall cognitive status: Within functional limits for tasks assessed  SENSATION: Periodic numbness and paresthesias in her ulnar forearm and hand.  POSTURE:  rounded shoulders, forward head, and mildly protracted R shoulder  PALPATION: Increased muscle tension and TTP in R>L UT, LS and medial periscapular muscles   CERVICAL ROM:   Active ROM Eval  Flexion 30 p! into upper back  Extension 53  Right lateral flexion 19 p! R upper shoulder  Left lateral flexion 20 p! R upper shoulder  Right rotation 58  Left  rotation 54   (Blank rows = not tested)  UPPER EXTREMITY ROM:  Active ROM Right eval Left eval  Shoulder flexion 112 p! 141  Shoulder extension 34 42  Shoulder abduction 156 162  Shoulder adduction    Shoulder internal rotation FIR [facility] FIR WFL  Shoulder external rotation FER - unable FER WFL  Elbow flexion    Elbow extension    Wrist flexion    Wrist extension    Wrist ulnar deviation    Wrist radial deviation    Wrist pronation    Wrist supination     (Blank rows = not tested)  UPPER EXTREMITY MMT:  MMT Right eval Left eval  Shoulder flexion 4 5  Shoulder extension 4 4+  Shoulder abduction 4+ 5  Shoulder adduction    Shoulder internal rotation 4+ 5  Shoulder external rotation 4 4+  Middle trapezius 4- 4  Lower trapezius 3- 3  Elbow flexion    Elbow extension    Wrist flexion    Wrist extension    Wrist ulnar deviation    Wrist radial deviation    Wrist pronation    Wrist supination    Grip strength 31# 37.33#   (Blank rows = not tested)  CERVICAL SPECIAL TESTS:  Spurling's test: Negative and Distraction test: Negative - slight reduction in R UE numbness and tingling with manual cervical distraction   TODAY'S TREATMENT:  02/20/24 Therapeutic Exercise: to improve strength, ROM, flexibility, and endurance  Nustep L5x80min UE/LE NEUROMUSCULAR RE-EDUCATION: To improve proprioception, balance, and kinesthesia. Prone over green pball I,T,Y,W x 10 each Ulnar nerve glides x 10  Thoracic ext with towel behind back x 10 Standing B ER RTB x 10  Standing horizontal ABD RTB x 10 Resisted chin tuck RTB behind head x 10  Manual Cervical distraction- initial reduction in radicular pain, last two reps pt noted less reduction  02/13/2024 - Eval SELF CARE:  Reviewed eval findings and role of PT in addressing identified deficits as well as instruction in initial HEP (see below).  Also provided education on neutral posture/spinal alignment including ideal sleeping posture and  use of pillows to maintain neutral spinal alignment.   PATIENT EDUCATION:  Education details: PT eval findings, anticipated POC, initial HEP, and postural awareness  Person educated: Patient Education method: Explanation, Demonstration, Verbal cues, and Handouts Education comprehension: verbalized understanding, returned demonstration, verbal cues required, and needs further education  HOME EXERCISE PROGRAM: Access Code: [email or web address] URL: [email or web address] Date: 02/20/2024 Prepared by: Verta Ellen  Exercises - Supine Thoracic Mobilization Towel Roll Vertical with Arm Stretch  - 1-2 x daily - 7 x weekly - 2 sets - 10 reps - 3 sec hold - Ulnar Nerve Glide- Full Arm  - 1 x daily - 7 x weekly - 2 sets - 10 reps - 3 sec hold - Standing Ulnar Nerve Glide  - 1 x daily - 7 x weekly - 2 sets - 10 reps - 3 sec hold - Shoulder External Rotation and Scapular Retraction with Resistance  - 1 x daily - 7 x weekly - 2 sets - 10 reps - Standing Shoulder Horizontal Abduction with Resistance  - 1 x daily - 7 x weekly - 2 sets - 10 reps - Cervical Retraction with Resistance  - 1 x daily - 7 x weekly - 2 sets - 10 reps   ASSESSMENT:  CLINICAL IMPRESSION: Tara Rosales is a 43 y.o. female who was seen at physical therapy for treatment for cervical radiculopathy. Pt responded well to the progression of exercises. We initiated postural strengthening to improve her alignment. Provided cues to engage her scapulae with the postural exercises. Pt  initially showed a good response to MT (decreased radicular pain), she may benefit from using cervical traction unit if symptoms persist. Tara Rosales will benefit from skilled PT to address above deficits to improve mobility and activity tolerance with decreased pain interference.  OBJECTIVE IMPAIRMENTS: decreased activity tolerance, decreased knowledge of condition, decreased ROM, decreased strength, hypomobility, increased fascial restrictions, impaired  perceived functional ability, increased muscle spasms, impaired flexibility, impaired sensation, impaired UE functional use, improper body mechanics, postural dysfunction, and pain.   ACTIVITY LIMITATIONS: carrying, lifting, sleeping, bathing, dressing, reach over head, and hygiene/grooming  PARTICIPATION LIMITATIONS: meal prep, cleaning, laundry, driving, shopping, community activity, and occupation  PERSONAL FACTORS: Past/current experiences, Time since onset of injury/illness/exacerbation, and 3+ comorbidities: Hypothyroidism due to Hashimoto's thyroiditis, myofascial pain syndrome, breast reduction surgery, asthma  are also affecting patient's functional outcome.   REHAB POTENTIAL: Good  CLINICAL DECISION MAKING: Evolving/moderate complexity  EVALUATION COMPLEXITY: Moderate   GOALS: Goals reviewed with patient? Yes  SHORT TERM GOALS: Target date: 03/12/2024  Patient will be independent with initial HEP to improve outcomes and carryover.  Baseline:  Goal status: IN PROGRESS  2.  Patient will report 25% improvement in neck pain and R UE radiculopathy to improve QOL.  Baseline: 5/10 on eval, up to 7-8/10 Goal status: IN PROGRESS  LONG TERM GOALS: Target date: 04/09/2024  Patient will be independent with ongoing/advanced HEP for self-management at home.  Baseline:  Goal status: IN PROGRESS  2.  Patient will demonstrate improved posture to decrease muscle imbalance. Baseline:  Goal status: IN PROGRESS  3.  Patient will report 50-75% improvement in neck pain and RUE radiculopathy to improve QOL.  Baseline: 5/10 on eval, up to 7-8/10 Goal status: IN PROGRESS  4.  Patient will demonstrate full pain free cervical ROM for safety with driving.   Baseline: Refer to above cervical ROM table Goal status: IN PROGRESS   5.  Patient will demonstrate full pain free R shoulder ROM for increased independence with grooming and self-care.  Baseline: Refer to above UE ROM table Goal status:  IN PROGRESS  6. Patient will demonstrate improved R shoulder and scapular strength to >/= 4+/5 with R grip strength symmetrical to or greater than L for functional UE use. Baseline: Refer to above UE MMT table Goal status: IN PROGRESS  7.  Patient will be able to resume normal sleeping position without sleep disturbance due to pain or R UE radiculopathy. Baseline: Currently having to sleep upright with R UE supported on pillow Goal status: IN PROGRESS   8.  Patient will report </= 15% on NDI to demonstrate improved functional ability.  Baseline: 15 / 50 = 30.0 % Goal status: IN PROGRESS  9.  Patient will </= 43% on QuickDASH to demonstrate improved functional ability   Baseline: 56.8 / 100 = 56.8 % Goal status: IN PROGRESS    PLAN:  PT FREQUENCY: 2x/week  PT DURATION: 8 weeks  PLANNED INTERVENTIONS: 97164- PT Re-evaluation, 97110-Therapeutic exercises, 97530- Therapeutic activity, [id number]- Neuromuscular re-education, 97535- Self Care, [id number]- Manual therapy, [id number]- Electrical stimulation (unattended), 97035- Ultrasound, 21308- Traction (mechanical), [id number]- Ionotophoresis 4mg /ml Dexamethasone, Patient/Family education, Taping, Dry Needling, Joint mobilization, Spinal mobilization, Cryotherapy, and Moist heat  PLAN FOR NEXT SESSION: progress postural strengthening and stretching; MT +/- TPDN to address abnormal muscle tension in neck and upper shoulders; modalities including possible cervical traction and/or I[place]opatch   Darleene Cleaver, PTA 02/20/2024, 5:11 PM

## 2024-02-22 ENCOUNTER — Encounter (HOSPITAL_BASED_OUTPATIENT_CLINIC_OR_DEPARTMENT_OTHER): Payer: Self-pay

## 2024-02-22 ENCOUNTER — Ambulatory Visit (HOSPITAL_BASED_OUTPATIENT_CLINIC_OR_DEPARTMENT_OTHER)
Admission: RE | Admit: 2024-02-22 | Discharge: 2024-02-22 | Disposition: A | Discharge status: Home / Self Care | Source: Ambulatory Visit | Attending: Family Medicine | Admitting: Family Medicine

## 2024-02-22 DIAGNOSIS — Z1231 Encounter for screening mammogram for malignant neoplasm of breast: Secondary | ICD-10-CM | POA: Diagnosis not present

## 2024-02-22 DIAGNOSIS — Z139 Encounter for screening, unspecified: Secondary | ICD-10-CM | POA: Diagnosis not present

## 2024-02-27 ENCOUNTER — Ambulatory Visit

## 2024-02-27 DIAGNOSIS — M62838 Other muscle spasm: Secondary | ICD-10-CM | POA: Diagnosis not present

## 2024-02-27 DIAGNOSIS — M5412 Radiculopathy, cervical region: Secondary | ICD-10-CM

## 2024-02-27 DIAGNOSIS — R293 Abnormal posture: Secondary | ICD-10-CM | POA: Diagnosis not present

## 2024-02-27 DIAGNOSIS — M6281 Muscle weakness (generalized): Secondary | ICD-10-CM

## 2024-02-27 NOTE — Therapy (Signed)
 OUTPATIENT PHYSICAL THERAPY CERVICAL TREATMENT   Patient Name: Tara Rosales MRN: [id number] DOB:June 21, 1981, 43 y.o., female Today's Date: 02/27/2024   END OF SESSION:  PT End of Session - 02/27/24 1711     Visit Number 3    Date for PT Re-Evaluation 04/09/24    Authorization Type BC[employer]    PT Start Time 1619    PT Stop Time 1706    PT Time Calculation (min) 47 min    Activity Tolerance Patient tolerated treatment well    Behavior During Therapy [facility] for tasks assessed/performed               Past Medical History:  Diagnosis Date   Abnormal uterine bleeding (AUB)    Allergic rhinitis    History of cervical dysplasia    s/p  LEEP  in 2004    in gyn office   Hypothyroidism due to Hashimoto's thyroiditis 09/19/2016   pcp and endocrinologist-- Johnella Moloney NP   Myofascial pain syndrome 10/14/2022   Positive ANA (antinuclear antibody) 10/14/2022   work-up done by rheumologist--- dr s. Corliss Skains   Wears glasses    Past Surgical History:  Procedure Laterality Date   BREAST REDUCTION SURGERY Bilateral 1999   CERVICAL BIOPSY  W/ LOOP ELECTRODE EXCISION  2004   DILITATION & CURRETTAGE/HYSTROSCOPY WITH NOVASURE ABLATION N/A 11/16/2022   Procedure: DILATATION & CURETTAGE/HYSTEROSCOPY WITH NOVASURE ABLATION;  Surgeon: Lorriane Shire, MD;  Location: [facility];  Service: Gynecology;  Laterality: N/A;   LYMPH NODE DISSECTION  2001   per pt under chin for enlargement,  benign   TONSILLECTOMY AND ADENOIDECTOMY  1992   child   WISDOM TOOTH EXTRACTION  1995   Patient Active Problem List   Diagnosis Date Noted   Abnormal uterine bleeding 11/16/2022   Hypothyroidism 09/19/2016   Vitamin D deficiency 03/26/2015   Abnormal Pap smear of cervix 07/24/2014    Class: History of   Asthma 05/07/2011    PCP: Sharlene Dory, DO   REFERRING PROVIDER: London Sheer, MD   REFERRING DIAG: [phone] (ICD-10-CM) - Radiculopathy, cervical region   THERAPY  DIAG:  Radiculopathy, cervical region  Abnormal posture  Muscle weakness (generalized)  Other muscle spasm  RATIONALE FOR EVALUATION AND TREATMENT: Rehabilitation  ONSET DATE: 01/02/24  NEXT MD VISIT: 02/14/24   SUBJECTIVE:                                                                                                                                                                                                         SUBJECTIVE  STATEMENT: 02/27/24- no more tingling in arm but now just a pain IE: Pt reports she woke up 1 morning in the end of January with tingling in her R arm which became more painful as the day progressed.  Went to urgent care and was told she had a pinched nerve - given pain shot and muscle relaxants but pain continued to worsen.  Went to ED the next day and given oxy but pain still continued to worsen.  F/u with PCP, where she was given cortisone injection and sent for cervical x-rays.  Pain better since steroid injection but still had constant numbness in ulnar distribution. Given a steroid pack 2 weeks ago and now numbness more intermittent.  Notes increased fatigue with all activities using R UE.  Currently unable to sleep except sitting up with pillow under R UE.  Hand dominance: Right  PAIN: Are you having pain? Yes: NPRS scale: 5/10 Pain location: R upper/posterior shoulder and down posterolateral R UE to elbow, numbness into 4th and 5th digits Pain description: radiating, slight throbbing Aggravating factors: typing, using computer mouse, washing hair, lying down to sleep Relieving factors: steroids, otherwise not much relief from meds; ice or heat; theragun  PERTINENT HISTORY:  Hypothyroidism due to Hashimoto's thyroiditis, myofascial pain syndrome, breast reduction surgery, asthma  PRECAUTIONS: None  RED FLAGS: None  HAND DOMINANCE: Right  WEIGHT BEARING RESTRICTIONS: No  FALLS:  Has patient fallen in last 6 months? 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  Grade 1 spondylolisthesis at C4/5 that shifts less than 0.5 mm between flexion and extension views.  No fracture or dislocation seen.   01/05/24 - DG cervical spine: IMPRESSION: Multilevel degenerative changes of the cervical spine, worst at C5-C6 and C6-C7.  Per Dr Cherre Robins, anterolisthesis C4 on C5, decreased disc height loss C5-C6, C6-C7, signs of facet arthropathy, vertebral body osteophytosis more pronounced C5-C6 > C6-C7.   2024/01/22 - DG right shoulder: IMPRESSION: No acute osseous abnormality of the right shoulder. No significant arthritis.  01/22/24 - DG right elbow: IMPRESSION: No acute osseous abnormality of the right elbow.   PATIENT SURVEYS:  NDI 15 / 50 = 30.0 % Quick Dash 56.8 / 100 = 56.8 %  COGNITION: Overall cognitive status: Within functional limits for tasks assessed  SENSATION: Periodic numbness and paresthesias in her ulnar forearm and hand.  POSTURE:  rounded shoulders, forward head, and mildly protracted R shoulder  PALPATION: Increased muscle tension and TTP in R>L UT, LS and medial periscapular muscles   CERVICAL ROM:   Active ROM Eval  Flexion 30 p! into upper back  Extension 53  Right lateral flexion 19 p! R upper shoulder  Left lateral flexion 20 p! R upper shoulder  Right rotation 58  Left rotation 54   (Blank rows = not tested)  UPPER EXTREMITY ROM:  Active ROM  Right eval Left eval  Shoulder flexion 112 p! 141  Shoulder extension 34 42  Shoulder abduction 156 162  Shoulder adduction  Shoulder internal rotation FIR [facility] FIR [facility]  Shoulder external rotation FER - unable FER WFL  Elbow flexion    Elbow extension    Wrist flexion    Wrist extension    Wrist ulnar deviation    Wrist radial deviation    Wrist pronation    Wrist supination     (Blank rows = not tested)  UPPER EXTREMITY MMT:  MMT Right eval Left eval  Shoulder flexion 4 5  Shoulder extension 4 4+  Shoulder abduction 4+ 5  Shoulder adduction    Shoulder internal rotation 4+ 5  Shoulder external rotation 4 4+  Middle trapezius 4- 4  Lower trapezius 3- 3  Elbow flexion    Elbow extension    Wrist flexion    Wrist extension    Wrist ulnar deviation    Wrist radial deviation    Wrist pronation    Wrist supination    Grip strength 31# 37.33#   (Blank rows = not tested)  CERVICAL SPECIAL TESTS:  Spurling's test: Negative and Distraction test: Negative - slight reduction in R UE numbness and tingling with manual cervical distraction   TODAY'S TREATMENT:  02/20/24 Therapeutic Exercise: to improve strength, ROM, flexibility, and endurance  UBE 3 min both ways  Seated SNAG cervical extension 2 x 10 SNAG bil cervical rotation x 10 NEUROMUSCULAR RE-EDUCATION: To improve proprioception, balance, and kinesthesia. Thoracic extension back over chair x 10  Standing rows RTB x 10 Standing shoulder extension RTB x 10  Manual Therapy: to decrease muscle spasm, pain and improve mobility.  STM to R UT and LS   02/20/24 Therapeutic Exercise: to improve strength, ROM, flexibility, and endurance  Nustep L5x79min UE/LE NEUROMUSCULAR RE-EDUCATION: To improve proprioception, balance, and kinesthesia. Prone over green pball I,T,Y,W x 10 each Ulnar nerve glides x 10  Thoracic ext with towel behind back x 10 Standing B ER RTB x 10  Standing horizontal ABD RTB x 10 Resisted chin tuck  RTB behind head x 10  Manual Cervical distraction- initial reduction in radicular pain, last two reps pt noted less reduction  02/13/2024 - Eval SELF CARE:  Reviewed eval findings and role of PT in addressing identified deficits as well as instruction in initial HEP (see below).  Also provided education on neutral posture/spinal alignment including ideal sleeping posture and use of pillows to maintain neutral spinal alignment.   PATIENT EDUCATION:  Education details: PT eval findings, anticipated POC, initial HEP, and postural awareness  Person educated: Patient Education method: Explanation, Demonstration, Verbal cues, and Handouts Education comprehension: verbalized understanding, returned demonstration, verbal cues required, and needs further education  HOME EXERCISE PROGRAM: Access Code: [email or web address] URL: [email or web address] Date: 02/20/2024 Prepared by: Verta Ellen  Exercises - Supine Thoracic Mobilization Towel Roll Vertical with Arm Stretch  - 1-2 x daily - 7 x weekly - 2 sets - 10 reps - 3 sec hold - Ulnar Nerve Glide- Full Arm  - 1 x daily - 7 x weekly - 2 sets - 10 reps - 3 sec hold - Standing Ulnar Nerve Glide  - 1 x daily - 7 x weekly - 2 sets - 10 reps - 3 sec hold - Shoulder External Rotation and Scapular Retraction with Resistance  - 1 x daily - 7 x weekly - 2 sets - 10 reps - Standing Shoulder Horizontal Abduction with Resistance  - 1 x daily - 7 x weekly - 2 sets - 10 reps - Cervical Retraction with Resistance  - 1 x  daily - 7 x weekly - 2 sets - 10 reps   ASSESSMENT:  CLINICAL IMPRESSION: Alexei Ey is a 43 y.o. female who was seen at physical therapy for treatment for cervical radiculopathy. Continued ppostural strengthening to improve alignment of spine. She noted less tingling down her arm but now reporting pain along the nerve distribution. She was pretty tight in her upper trap and levator, good response to [facility] therapy. STG #1 is now met.  Kesia will benefit from skilled PT to address above deficits to improve mobility and activity tolerance with decreased pain interference.  OBJECTIVE IMPAIRMENTS: decreased activity tolerance, decreased knowledge of condition, decreased ROM, decreased strength, hypomobility, increased fascial restrictions, impaired perceived functional ability, increased muscle spasms, impaired flexibility, impaired sensation, impaired UE functional use, improper body mechanics, postural dysfunction, and pain.   ACTIVITY LIMITATIONS: carrying, lifting, sleeping, bathing, dressing, reach over head, and hygiene/grooming  PARTICIPATION LIMITATIONS: meal prep, cleaning, laundry, driving, shopping, community activity, and occupation  PERSONAL FACTORS: Past/current experiences, Time since onset of injury/illness/exacerbation, and 3+ comorbidities: Hypothyroidism due to Hashimoto's thyroiditis, myofascial pain syndrome, breast reduction surgery, asthma  are also affecting patient's functional outcome.   REHAB POTENTIAL: Good  CLINICAL DECISION MAKING: Evolving/moderate complexity  EVALUATION COMPLEXITY: Moderate   GOALS: Goals reviewed with patient? Yes  SHORT TERM GOALS: Target date: 03/12/2024  Patient will be independent with initial HEP to improve outcomes and carryover.  Baseline:  Goal status: MET- 02/27/24  2.  Patient will report 25% improvement in neck pain and R UE radiculopathy to improve QOL.  Baseline: 5/10 on eval, up to 7-8/10 Goal status: IN PROGRESS  LONG TERM GOALS: Target date: 04/09/2024  Patient will be independent with ongoing/advanced HEP for self-management at home.  Baseline:  Goal status: IN PROGRESS  2.  Patient will demonstrate improved posture to decrease muscle imbalance. Baseline:  Goal status: IN PROGRESS  3.  Patient will report 50-75% improvement in neck pain and RUE radiculopathy to improve QOL.  Baseline: 5/10 on eval, up to 7-8/10 Goal status: IN PROGRESS  4.   Patient will demonstrate full pain free cervical ROM for safety with driving.   Baseline: Refer to above cervical ROM table Goal status: IN PROGRESS   5.  Patient will demonstrate full pain free R shoulder ROM for increased independence with grooming and self-care.  Baseline: Refer to above UE ROM table Goal status: IN PROGRESS  6. Patient will demonstrate improved R shoulder and scapular strength to >/= 4+/5 with R grip strength symmetrical to or greater than L for functional UE use. Baseline: Refer to above UE MMT table Goal status: IN PROGRESS  7.  Patient will be able to resume normal sleeping position without sleep disturbance due to pain or R UE radiculopathy. Baseline: Currently having to sleep upright with R UE supported on pillow Goal status: IN PROGRESS   8.  Patient will report </= 15% on NDI to demonstrate improved functional ability.  Baseline: 15 / 50 = 30.0 % Goal status: IN PROGRESS  9.  Patient will </= 43% on QuickDASH to demonstrate improved functional ability   Baseline: 56.8 / 100 = 56.8 % Goal status: IN PROGRESS    PLAN:  PT FREQUENCY: 2x/week  PT DURATION: 8 weeks  PLANNED INTERVENTIONS: 97164- PT Re-evaluation, 97110-Therapeutic exercises, 97530- Therapeutic activity, 97112- Neuromuscular re-education, 97535- Self Care, 66440- Manual therapy, [id number]- Electrical stimulation (unattended), 97035- Ultrasound, [id number]- Traction (mechanical), [id number]- Ionotophoresis 4mg /ml Dexamethasone, Patient/Family education, Taping, Dry Needling, Joint mobilization, Spinal mobilization, Cryotherapy,  and Moist heat  PLAN FOR NEXT SESSION: progress postural strengthening and stretching; MT +/- TPDN to address abnormal muscle tension in neck and upper shoulders; modalities including possible cervical traction and/or [place]   Darleene Cleaver, PTA 02/27/2024, 5:15 PM

## 2024-02-29 ENCOUNTER — Ambulatory Visit: Admitting: Physical Therapy

## 2024-03-04 ENCOUNTER — Ambulatory Visit

## 2024-03-04 ENCOUNTER — Encounter: Payer: Self-pay | Admitting: Orthopedic Surgery

## 2024-03-04 DIAGNOSIS — M5412 Radiculopathy, cervical region: Secondary | ICD-10-CM

## 2024-03-05 ENCOUNTER — Ambulatory Visit: Attending: Orthopedic Surgery

## 2024-03-05 DIAGNOSIS — M5412 Radiculopathy, cervical region: Secondary | ICD-10-CM | POA: Diagnosis not present

## 2024-03-05 DIAGNOSIS — M6281 Muscle weakness (generalized): Secondary | ICD-10-CM | POA: Insufficient documentation

## 2024-03-05 DIAGNOSIS — M62838 Other muscle spasm: Secondary | ICD-10-CM | POA: Insufficient documentation

## 2024-03-05 DIAGNOSIS — R293 Abnormal posture: Secondary | ICD-10-CM | POA: Insufficient documentation

## 2024-03-05 NOTE — Therapy (Signed)
 OUTPATIENT PHYSICAL THERAPY CERVICAL TREATMENT   Patient Name: Tara Rosales MRN: [id number] DOB:03/17/1981, 43 y.o., female Today's Date: 03/05/2024   END OF SESSION:  PT End of Session - 03/05/24 1718     Visit Number 4    Date for PT Re-Evaluation 04/09/24    Authorization Type BC[employer]    PT Start Time 1618    PT Stop Time 1701    PT Time Calculation (min) 43 min    Activity Tolerance Patient tolerated treatment well    Behavior During Therapy [facility] for tasks assessed/performed                Past Medical History:  Diagnosis Date   Abnormal uterine bleeding (AUB)    Allergic rhinitis    History of cervical dysplasia    s/p  LEEP  in 2004    in gyn office   Hypothyroidism due to Hashimoto's thyroiditis 09/19/2016   pcp and endocrinologist-- Johnella Moloney NP   Myofascial pain syndrome 10/14/2022   Positive ANA (antinuclear antibody) 10/14/2022   work-up done by rheumologist--- dr s. Corliss Skains   Wears glasses    Past Surgical History:  Procedure Laterality Date   BREAST REDUCTION SURGERY Bilateral 1999   CERVICAL BIOPSY  W/ LOOP ELECTRODE EXCISION  2004   DILITATION & CURRETTAGE/HYSTROSCOPY WITH NOVASURE ABLATION N/A 11/16/2022   Procedure: DILATATION & CURETTAGE/HYSTEROSCOPY WITH NOVASURE ABLATION;  Surgeon: Lorriane Shire, MD;  Location: [facility];  Service: Gynecology;  Laterality: N/A;   LYMPH NODE DISSECTION  2001   per pt under chin for enlargement,  benign   TONSILLECTOMY AND ADENOIDECTOMY  1992   child   WISDOM TOOTH EXTRACTION  1995   Patient Active Problem List   Diagnosis Date Noted   Abnormal uterine bleeding 11/16/2022   Hypothyroidism 09/19/2016   Vitamin D deficiency 03/26/2015   Abnormal Pap smear of cervix 07/24/2014    Class: History of   Asthma 05/07/2011    PCP: Sharlene Dory, DO   REFERRING PROVIDER: London Sheer, MD   REFERRING DIAG: [phone] (ICD-10-CM) - Radiculopathy, cervical region   THERAPY  DIAG:  Radiculopathy, cervical region  Abnormal posture  Muscle weakness (generalized)  Other muscle spasm  RATIONALE FOR EVALUATION AND TREATMENT: Rehabilitation  ONSET DATE: 01/02/24  NEXT MD VISIT: 02/14/24   SUBJECTIVE:  SUBJECTIVE STATEMENT: 02/27/24- no more tingling in arm but pain down nerve distrubution IE: Pt reports she woke up 1 morning in the end of January with tingling in her R arm which became more painful as the day progressed.  Went to urgent care and was told she had a pinched nerve - given pain shot and muscle relaxants but pain continued to worsen.  Went to ED the next day and given oxy but pain still continued to worsen.  F/u with PCP, where she was given cortisone injection and sent for cervical x-rays.  Pain better since steroid injection but still had constant numbness in ulnar distribution. Given a steroid pack 2 weeks ago and now numbness more intermittent.  Notes increased fatigue with all activities using R UE.  Currently unable to sleep except sitting up with pillow under R UE.  Hand dominance: Right  PAIN: Are you having pain? Yes: NPRS scale: 5/10 Pain location: R upper/posterior shoulder and down posterolateral R UE to elbow, numbness into 4th and 5th digits Pain description: radiating, slight throbbing Aggravating factors: typing, using computer mouse, washing hair, lying down to sleep Relieving factors: steroids, otherwise not much relief from meds; ice or heat; theragun  PERTINENT HISTORY:  Hypothyroidism due to Hashimoto's thyroiditis, myofascial pain syndrome, breast reduction surgery, asthma  PRECAUTIONS: None  RED FLAGS: None  HAND DOMINANCE: Right  WEIGHT BEARING RESTRICTIONS: No  FALLS:  Has patient fallen in last 6 months? 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  Grade 1 spondylolisthesis at C4/5 that shifts less than 0.5 mm between flexion and extension views.  No fracture or dislocation seen.   01/05/24 - DG cervical spine: IMPRESSION: Multilevel degenerative changes of the cervical spine, worst at C5-C6 and C6-C7.  Per Dr Cherre Robins, anterolisthesis C4 on C5, decreased disc height loss C5-C6, C6-C7, signs of facet arthropathy, vertebral body osteophytosis more pronounced C5-C6 > C6-C7.   01/13/24 - DG right shoulder: IMPRESSION: No acute osseous abnormality of the right shoulder. No significant arthritis.  January 13, 2024 - DG right elbow: IMPRESSION: No acute osseous abnormality of the right elbow.   PATIENT SURVEYS:  NDI 15 / 50 = 30.0 % Quick Dash 56.8 / 100 = 56.8 %  COGNITION: Overall cognitive status: Within functional limits for tasks assessed  SENSATION: Periodic numbness and paresthesias in her ulnar forearm and hand.  POSTURE:  rounded shoulders, forward head, and mildly protracted R shoulder  PALPATION: Increased muscle tension and TTP in R>L UT, LS and medial periscapular muscles   CERVICAL ROM:   Active ROM Eval  Flexion 30 p! into upper back  Extension 53  Right lateral flexion 19 p! R upper shoulder  Left lateral flexion 20 p! R upper shoulder  Right rotation 58  Left rotation 54   (Blank rows = not tested)  UPPER EXTREMITY ROM:  Active ROM  Right eval Left eval  Shoulder flexion 112 p! 141  Shoulder extension 34 42  Shoulder abduction 156 162  Shoulder adduction  Shoulder internal rotation FIR [facility] FIR [facility]  Shoulder external rotation FER - unable FER WFL  Elbow flexion    Elbow extension    Wrist flexion    Wrist extension    Wrist ulnar deviation    Wrist radial deviation    Wrist pronation    Wrist supination     (Blank rows = not tested)  UPPER EXTREMITY MMT:  MMT Right eval Left eval  Shoulder flexion 4 5  Shoulder extension 4 4+  Shoulder abduction 4+ 5  Shoulder adduction    Shoulder internal rotation 4+ 5  Shoulder external rotation 4 4+  Middle trapezius 4- 4  Lower trapezius 3- 3  Elbow flexion    Elbow extension    Wrist flexion    Wrist extension    Wrist ulnar deviation    Wrist radial deviation    Wrist pronation    Wrist supination    Grip strength 31# 37.33#   (Blank rows = not tested)  CERVICAL SPECIAL TESTS:  Spurling's test: Negative and Distraction test: Negative - slight reduction in R UE numbness and tingling with manual cervical distraction   TODAY'S TREATMENT:  03/05/24 Therapeutic Exercise: to improve strength, ROM, flexibility, and endurance  Nustep L4x52min UE/LE Tricep stretch arm overhead x 30" Tricep stretch arm supported on chair in front x 30" Manual Therapy: to decrease muscle spasm, pain and improve mobility. STM and TPR to R infraspinatus, teres group, posterior deltoids, triceps Trial of cervical traction- increased tingling down arm therefore discontinued  02/20/24 Therapeutic Exercise: to improve strength, ROM, flexibility, and endurance  UBE 3 min both ways  Seated SNAG cervical extension 2 x 10 SNAG bil cervical rotation x 10 NEUROMUSCULAR RE-EDUCATION: To improve proprioception, balance, and kinesthesia. Thoracic extension back over chair x 10  Standing rows RTB x 10 Standing shoulder extension RTB x 10  Manual Therapy: to decrease muscle spasm, pain and  improve mobility.  STM to R UT and LS   02/20/24 Therapeutic Exercise: to improve strength, ROM, flexibility, and endurance  Nustep L5x54min UE/LE NEUROMUSCULAR RE-EDUCATION: To improve proprioception, balance, and kinesthesia. Prone over green pball I,T,Y,W x 10 each Ulnar nerve glides x 10  Thoracic ext with towel behind back x 10 Standing B ER RTB x 10  Standing horizontal ABD RTB x 10 Resisted chin tuck RTB behind head x 10  Manual Cervical distraction- initial reduction in radicular pain, last two reps pt noted less reduction  02/13/2024 - Eval SELF CARE:  Reviewed eval findings and role of PT in addressing identified deficits as well as instruction in initial HEP (see below).  Also provided education on neutral posture/spinal alignment including ideal sleeping posture and use of pillows to maintain neutral spinal alignment.   PATIENT EDUCATION:  Education details: added seated tricep stretch over chair to HEP (picture unavailable on Medbridge) Person educated: Patient Education method: Programmer, multimedia, Demonstration, Verbal cues, and Handouts Education comprehension: verbalized understanding, returned demonstration, verbal cues required, and needs further education  HOME EXERCISE PROGRAM: Access Code: [email or web address] URL: [email or web address] Date: 02/20/2024 Prepared by: Verta Ellen  Exercises - Supine Thoracic Mobilization Towel Roll Vertical with Arm Stretch  - 1-2 x daily - 7 x weekly - 2 sets - 10 reps - 3 sec hold - Ulnar Nerve Glide- Full Arm  - 1 x daily - 7 x weekly - 2 sets - 10 reps - 3 sec hold - Standing Ulnar Nerve Glide  - 1 x daily - 7 x weekly - 2  sets - 10 reps - 3 sec hold - Shoulder External Rotation and Scapular Retraction with Resistance  - 1 x daily - 7 x weekly - 2 sets - 10 reps - Standing Shoulder Horizontal Abduction with Resistance  - 1 x daily - 7 x weekly - 2 sets - 10 reps - Cervical Retraction with Resistance  - 1 x daily - 7 x weekly - 2  sets - 10 reps   ASSESSMENT:  CLINICAL IMPRESSION: Jamella Grayer is a 43 y.o. female who was seen at physical therapy for treatment for cervical radiculopathy. Her tingling down the R UE seems to be improving but now she reports increased muscle tension along the C5-C6 nerve distribution. Pain was initially 8/10, however after manual techniques pain reduced to 3/10 with decreased muscle tension. Cervical traction seemed to increase her radicular sx, so we discontinued. She would benefit from a trial of DN come next visit. [place] will benefit from skilled PT to address above deficits to improve mobility and activity tolerance with decreased pain interference.  OBJECTIVE IMPAIRMENTS: decreased activity tolerance, decreased knowledge of condition, decreased ROM, decreased strength, hypomobility, increased fascial restrictions, impaired perceived functional ability, increased muscle spasms, impaired flexibility, impaired sensation, impaired UE functional use, improper body mechanics, postural dysfunction, and pain.   ACTIVITY LIMITATIONS: carrying, lifting, sleeping, bathing, dressing, reach over head, and hygiene/grooming  PARTICIPATION LIMITATIONS: meal prep, cleaning, laundry, driving, shopping, community activity, and occupation  PERSONAL FACTORS: Past/current experiences, Time since onset of injury/illness/exacerbation, and 3+ comorbidities: Hypothyroidism due to Hashimoto's thyroiditis, myofascial pain syndrome, breast reduction surgery, asthma  are also affecting patient's functional outcome.   REHAB POTENTIAL: Good  CLINICAL DECISION MAKING: Evolving/moderate complexity  EVALUATION COMPLEXITY: Moderate   GOALS: Goals reviewed with patient? Yes  SHORT TERM GOALS: Target date: 03/12/2024  Patient will be independent with initial HEP to improve outcomes and carryover.  Baseline:  Goal status: MET- 02/27/24  2.  Patient will report 25% improvement in neck pain and R UE radiculopathy  to improve QOL.  Baseline: 5/10 on eval, up to 7-8/10 Goal status: IN PROGRESS  LONG TERM GOALS: Target date: 04/09/2024  Patient will be independent with ongoing/advanced HEP for self-management at home.  Baseline:  Goal status: IN PROGRESS  2.  Patient will demonstrate improved posture to decrease muscle imbalance. Baseline:  Goal status: IN PROGRESS  3.  Patient will report 50-75% improvement in neck pain and RUE radiculopathy to improve QOL.  Baseline: 5/10 on eval, up to 7-8/10 Goal status: IN PROGRESS  4.  Patient will demonstrate full pain free cervical ROM for safety with driving.   Baseline: Refer to above cervical ROM table Goal status: IN PROGRESS   5.  Patient will demonstrate full pain free R shoulder ROM for increased independence with grooming and self-care.  Baseline: Refer to above UE ROM table Goal status: IN PROGRESS  6. Patient will demonstrate improved R shoulder and scapular strength to >/= 4+/5 with R grip strength symmetrical to or greater than L for functional UE use. Baseline: Refer to above UE MMT table Goal status: IN PROGRESS  7.  Patient will be able to resume normal sleeping position without sleep disturbance due to pain or R UE radiculopathy. Baseline: Currently having to sleep upright with R UE supported on pillow Goal status: IN PROGRESS   8.  Patient will report </= 15% on NDI to demonstrate improved functional ability.  Baseline: 15 / 50 = 30.0 % Goal status: IN PROGRESS  9.  Patient will </= 43% on QuickDASH to demonstrate improved functional ability   Baseline: 56.8 / 100 = 56.8 % Goal status: IN PROGRESS    PLAN:  PT FREQUENCY: 2x/week  PT DURATION: 8 weeks  PLANNED INTERVENTIONS: 97164- PT Re-evaluation, 97110-Therapeutic exercises, 97530- Therapeutic activity, [id number]- Neuromuscular re-education, 97535- Self Care, 16109- Manual therapy, [id number]- Electrical stimulation (unattended), 97035- Ultrasound, 60454- Traction (mechanical),  [id number]- Ionotophoresis 4mg /ml Dexamethasone, Patient/Family education, Taping, Dry Needling, Joint mobilization, Spinal mobilization, Cryotherapy, and Moist heat  PLAN FOR NEXT SESSION: progress postural strengthening and stretching; MT +/- TPDN to address abnormal muscle tension in neck and upper shoulders; modalities including possible cervical traction and/or I[place]op[place]   Darleene Cleaver, PTA 03/05/2024, 5:19 PM

## 2024-03-06 ENCOUNTER — Telehealth: Payer: Self-pay | Admitting: Orthopedic Surgery

## 2024-03-06 DIAGNOSIS — F331 Major depressive disorder, recurrent, moderate: Secondary | ICD-10-CM | POA: Diagnosis not present

## 2024-03-06 DIAGNOSIS — F411 Generalized anxiety disorder: Secondary | ICD-10-CM | POA: Diagnosis not present

## 2024-03-06 NOTE — Telephone Encounter (Signed)
 Pt checking to see if Dr Christell Constant got the Mychart message she sent. Pt still in a lot of pain and states Dr Christell Constant told her that she may be able to get an injection from Dr Alvester Morin

## 2024-03-07 ENCOUNTER — Encounter: Payer: Self-pay | Admitting: Physical Therapy

## 2024-03-07 ENCOUNTER — Ambulatory Visit: Admitting: Physical Therapy

## 2024-03-07 DIAGNOSIS — M5412 Radiculopathy, cervical region: Secondary | ICD-10-CM | POA: Diagnosis not present

## 2024-03-07 DIAGNOSIS — M6281 Muscle weakness (generalized): Secondary | ICD-10-CM

## 2024-03-07 DIAGNOSIS — M62838 Other muscle spasm: Secondary | ICD-10-CM | POA: Diagnosis not present

## 2024-03-07 DIAGNOSIS — R293 Abnormal posture: Secondary | ICD-10-CM | POA: Diagnosis not present

## 2024-03-07 NOTE — Therapy (Signed)
 OUTPATIENT PHYSICAL THERAPY TREATMENT   Patient Name: Tara Rosales MRN: [id number] DOB:[birth year], 43 y.o., female Today's Date: 03/07/2024   END OF SESSION:  PT End of Session - 03/07/24 1617     Visit Number 5    Date for PT Re-Evaluation 04/09/24    Authorization Type BC[employer]    PT Start Time 1617    PT Stop Time 1702    PT Time Calculation (min) 45 min    Activity Tolerance Patient tolerated treatment well    Behavior During Therapy [facility] for tasks assessed/performed                 Past Medical History:  Diagnosis Date   Abnormal uterine bleeding (AUB)    Allergic rhinitis    History of cervical dysplasia    s/p  LEEP  in 2004    in gyn office   Hypothyroidism due to Hashimoto's thyroiditis 09/19/2016   pcp and endocrinologist-- Johnella Moloney NP   Myofascial pain syndrome 10/14/2022   Positive ANA (antinuclear antibody) 10/14/2022   work-up done by rheumologist--- dr s. Corliss Skains   Wears glasses    Past Surgical History:  Procedure Laterality Date   BREAST REDUCTION SURGERY Bilateral 1999   CERVICAL BIOPSY  W/ LOOP ELECTRODE EXCISION  2004   DILITATION & CURRETTAGE/HYSTROSCOPY WITH NOVASURE ABLATION N/A 11/16/2022   Procedure: DILATATION & CURETTAGE/HYSTEROSCOPY WITH NOVASURE ABLATION;  Surgeon: Lorriane Shire, MD;  Location: [facility];  Service: Gynecology;  Laterality: N/A;   LYMPH NODE DISSECTION  2001   per pt under chin for enlargement,  benign   TONSILLECTOMY AND ADENOIDECTOMY  1992   child   WISDOM TOOTH EXTRACTION  1995   Patient Active Problem List   Diagnosis Date Noted   Abnormal uterine bleeding 11/16/2022   Hypothyroidism 09/19/2016   Vitamin D deficiency 03/26/2015   Abnormal Pap smear of cervix 07/24/2014    Class: History of   Asthma 05/07/2011    PCP: Sharlene Dory, DO   REFERRING PROVIDER: London Sheer, MD   REFERRING DIAG: [phone] (ICD-10-CM) - Radiculopathy, cervical region   THERAPY DIAG:   Radiculopathy, cervical region  Abnormal posture  Muscle weakness (generalized)  Other muscle spasm  RATIONALE FOR EVALUATION AND TREATMENT: Rehabilitation  ONSET DATE: 01/02/24  NEXT MD VISIT: 04/10/24   SUBJECTIVE:  SUBJECTIVE STATEMENT: 03/07/24 - Pt reports pain not as extreme as last visit but still having radicular pain moving from ulnar side of R hand to her index finger. Referral pending as of yesterday to Dr. Alvester Morin for possible cervical ESI.  IE: Pt reports she woke up 1 morning in the end of January with tingling in her R arm which became more painful as the day progressed.  Went to urgent care and was told she had a pinched nerve - given pain shot and muscle relaxants but pain continued to worsen.  Went to ED the next day and given oxy but pain still continued to worsen.  F/u with PCP, where she was given cortisone injection and sent for cervical x-rays.  Pain better since steroid injection but still had constant numbness in ulnar distribution. Given a steroid pack 2 weeks ago and now numbness more intermittent.  Notes increased fatigue with all activities using R UE.  Currently unable to sleep except sitting up with pillow under R UE.  Hand dominance: Right  PAIN: Are you having pain? Yes: NPRS scale: 4-5/10 Pain location: R upper/posterior shoulder and down posterolateral R UE to elbow, numbness into 4th and 5th digits Pain description: radiating, slight throbbing Aggravating factors: typing, using computer mouse, washing hair, lying down to sleep Relieving factors: steroids, otherwise not much relief from meds; ice or heat; theragun  PERTINENT HISTORY:  Hypothyroidism due to Hashimoto's thyroiditis, myofascial pain syndrome, breast reduction surgery, asthma  PRECAUTIONS:  None  RED FLAGS: None  HAND DOMINANCE: Right  WEIGHT BEARING RESTRICTIONS: No  FALLS:  Has patient fallen in last 6 months? 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  Grade 1 spondylolisthesis at C4/5 that shifts less than 0.5 mm between flexion and extension views.  No fracture or dislocation seen.   01/05/24 - DG cervical spine: IMPRESSION: Multilevel degenerative changes of the cervical spine, worst at C5-C6 and C6-C7.  Per Dr Cherre Robins, anterolisthesis C4 on C5, decreased disc height loss C5-C6, C6-C7, signs of facet arthropathy, vertebral body osteophytosis more pronounced C5-C6 > C6-C7.   28-Jan-2024 - DG right shoulder: IMPRESSION: No acute osseous abnormality of the right shoulder. No significant arthritis.  01/28/24 - DG right elbow: IMPRESSION: No acute osseous abnormality of the right elbow.   PATIENT SURVEYS:  NDI 15 / 50 = 30.0 % Quick Dash 56.8 / 100 = 56.8 %  COGNITION: Overall cognitive status: Within functional limits for tasks assessed  SENSATION: Periodic numbness and paresthesias in her ulnar forearm and hand.  POSTURE:  rounded shoulders, forward head, and mildly protracted R shoulder  PALPATION: Increased muscle tension and TTP in R>L UT, LS and medial periscapular muscles   CERVICAL ROM:   Active ROM Eval  Flexion 30 p! into upper back  Extension 53  Right lateral flexion 19 p! R upper shoulder   Left lateral flexion 20 p! R upper shoulder  Right rotation 58  Left rotation 54   (Blank rows = not tested)  UPPER EXTREMITY ROM:  Active ROM Right eval Left eval  Shoulder flexion 112 p! 141  Shoulder extension 34 42  Shoulder abduction 156 162  Shoulder adduction    Shoulder internal rotation FIR [facility] FIR WFL  Shoulder external rotation FER - unable FER WFL  Elbow flexion    Elbow extension    Wrist flexion    Wrist extension    Wrist ulnar deviation    Wrist radial deviation    Wrist pronation    Wrist supination     (Blank rows = not tested)  UPPER EXTREMITY MMT:  MMT Right eval Left eval  Shoulder flexion 4 5  Shoulder extension 4 4+  Shoulder abduction 4+ 5  Shoulder adduction    Shoulder internal rotation 4+ 5  Shoulder external rotation 4 4+  Middle trapezius 4- 4  Lower trapezius 3- 3  Elbow flexion    Elbow extension    Wrist flexion    Wrist extension    Wrist ulnar deviation    Wrist radial deviation    Wrist pronation    Wrist supination    Grip strength 31# 37.33#   (Blank rows = not tested)  CERVICAL SPECIAL TESTS:  Spurling's test: Negative and Distraction test: Negative - slight reduction in R UE numbness and tingling with manual cervical distraction   TODAY'S TREATMENT:   03/07/2024  THERAPEUTIC EXERCISE: To improve strength, endurance, and ROM.  Demonstration, verbal and tactile cues throughout for technique. UBE: L1.5 x 6 min (3' each fwd & back)  MANUAL THERAPY: To promote normalized muscle tension, improved flexibility, improved joint mobility, reduced pain, and reduce radicular symptoms . Trigger Point Dry Needling: Treatment instructions/education: Initial Treatment: Pt instructed on Dry Needling rational, procedures, and possible side effects. Pt instructed to expect mild to moderate muscle soreness later in the day and/or into the next day.  Pt instructed in methods to reduce muscle soreness. Pt instructed to continue prescribed  HEP. Because Dry Needling was performed over or adjacent to a lung field, pt was educated on S/S of pneumothorax and to seek immediate medical attention should they occur.  Patient was educated on signs and symptoms of infection and other risk factors and advised to seek medical attention should they occur.  Patient verbalized understanding of these instructions and education.  Education Handout Provided: Previously Provided Consent: Patient Verbal Consent Given: Yes Treatment: Muscles Treated: B UT, LS, cervical multifidi Skilled palpation and monitoring of soft tissue during DN Electrical Stimulation Performed: No Treatment Response/Outcome: Twitch response elicited, Palpable increase in muscle length, Decreased tissue resistance noted, Decreased TTP, and Improved exercise tolerance STM/DTM, manual TPR and pin & stretch to muscles addressed with DN  NEUROMUSCULAR RE-EDUCATION: To improve  UE radicular symptoms .  R brachial plexus nerve glides x 10   03/05/24 Therapeutic Exercise: to improve strength, ROM, flexibility, and endurance  Nustep L4x41min UE/LE Tricep stretch arm overhead x 30" Tricep stretch arm supported on chair in front x 30" Manual Therapy: to decrease muscle spasm, pain and improve mobility. STM and TPR to R infraspinatus, teres group, posterior deltoids, triceps Trial of cervical traction- increased tingling down arm therefore discontinued   02/27/24 Therapeutic Exercise: to improve strength, ROM, flexibility, and endurance  UBE 3 min both ways  Seated SNAG cervical extension 2 x 10 SNAG bil cervical rotation x 10 NEUROMUSCULAR RE-EDUCATION: To improve proprioception, balance, and kinesthesia. Thoracic extension back over chair x 10  Standing rows RTB x 10 Standing shoulder extension RTB x  10  Manual Therapy: to decrease muscle spasm, pain and improve mobility.  STM to R UT and LS    PATIENT EDUCATION:  Education details: HEP update - brachial plexus nerve  glide, role of DN, and DN rational, procedure, outcomes, potential side effects, and recommended post-treatment exercises/activity  Person educated: Patient Education method: Explanation, Demonstration, Verbal cues, and Handouts Education comprehension: verbalized understanding, returned demonstration, verbal cues required, and needs further education  HOME EXERCISE PROGRAM: Access Code: [email or web address] URL: [email or web address] Date: 03/07/2024 Prepared by: Glenetta Hew  Exercises - Supine Thoracic Mobilization Towel Roll Vertical with Arm Stretch  - 1-2 x daily - 7 x weekly - 2 sets - 10 reps - 3 sec hold - Ulnar Nerve Glide- Full Arm  - 1 x daily - 7 x weekly - 2 sets - 10 reps - 3 sec hold - Standing Ulnar Nerve Glide  - 1 x daily - 7 x weekly - 2 sets - 10 reps - 3 sec hold - Shoulder External Rotation and Scapular Retraction with Resistance  - 1 x daily - 7 x weekly - 2 sets - 10 reps - Standing Shoulder Horizontal Abduction with Resistance  - 1 x daily - 7 x weekly - 2 sets - 10 reps - Cervical Retraction with Resistance  - 1 x daily - 7 x weekly - 2 sets - 10 reps - Cervical Extension AROM with Strap  - 2 x daily - 7 x weekly - 2 sets - 10 reps - Seated Assisted Cervical Rotation with Towel  - 2 x daily - 7 x weekly - 2 sets - 10 reps  Patient Education - Brachial plexus nerve glide   ASSESSMENT:  CLINICAL IMPRESSION: Tara Rosales reports her pain is better than it was at her PT visit earlier this week, but still notes a lot of increased muscle tension in her neck and upper shoulders as well as shifting of her radicular numbness and tingling from the 4th and 5th digits to the 2nd digit in her R hand.  After explanation of DN rational, procedures, outcomes and potential side effects, including precautions with DN over the lung fields, patient verbalized consent to DN treatment in conjunction with manual STM/DTM and TPR to reduce ttp/muscle tension. Muscles treated as indicated  above. DN produced normal response with good twitches elicited resulting in palpable reduction in pain/ttp and muscle tension. Pt educated to expect mild to moderate muscle soreness for up to 24-48 hrs and instructed to continue prescribed HEP and current activity level with pt verbalizing understanding of these instructions. Guidance provided on managing post-DN soreness with estim/TENS and moist heat or ice pack to promote further muscle relaxation/relief from muscle spasm. Tara Rosales notes benefit from the initial nerve glides provided, therefore added brachial plexus nerve glide to HEP given new radicular symptoms.  Tara Rosales will benefit from continued skilled PT to address ongoing deficits to improve mobility and activity tolerance with decreased pain interference.  She is awaiting consult with Dr. Alvester Morin regarding possible cervical ESI to address ongoing pain and radicular symptoms.  OBJECTIVE IMPAIRMENTS: decreased activity tolerance, decreased knowledge of condition, decreased ROM, decreased strength, hypomobility, increased fascial restrictions, impaired perceived functional ability, increased muscle spasms, impaired flexibility, impaired sensation, impaired UE functional use, improper body mechanics, postural dysfunction, and pain.   ACTIVITY LIMITATIONS: carrying, lifting, sleeping, bathing, dressing, reach over head, and hygiene/grooming  PARTICIPATION LIMITATIONS: meal prep, cleaning, laundry, driving, shopping, community activity, and occupation  PERSONAL FACTORS: Past/current experiences, Time since onset of injury/illness/exacerbation,  and 3+ comorbidities: Hypothyroidism due to Hashimoto's thyroiditis, myofascial pain syndrome, breast reduction surgery, asthma  are also affecting patient's functional outcome.   REHAB POTENTIAL: Good  CLINICAL DECISION MAKING: Evolving/moderate complexity  EVALUATION COMPLEXITY: Moderate   GOALS: Goals reviewed with patient? Yes  SHORT TERM GOALS:  Target date: 03/12/2024  Patient will be independent with initial HEP to improve outcomes and carryover.  Baseline:  Goal status: MET- 02/27/24  2.  Patient will report 25% improvement in neck pain and R UE radiculopathy to improve QOL.  Baseline: 5/10 on eval, up to 7-8/10 Goal status: IN PROGRESS  LONG TERM GOALS: Target date: 04/09/2024  Patient will be independent with ongoing/advanced HEP for self-management at home.  Baseline:  Goal status: IN PROGRESS  2.  Patient will demonstrate improved posture to decrease muscle imbalance. Baseline:  Goal status: IN PROGRESS  3.  Patient will report 50-75% improvement in neck pain and RUE radiculopathy to improve QOL.  Baseline: 5/10 on eval, up to 7-8/10 Goal status: IN PROGRESS  4.  Patient will demonstrate full pain free cervical ROM for safety with driving.   Baseline: Refer to above cervical ROM table Goal status: IN PROGRESS   5.  Patient will demonstrate full pain free R shoulder ROM for increased independence with grooming and self-care.  Baseline: Refer to above UE ROM table Goal status: IN PROGRESS  6. Patient will demonstrate improved R shoulder and scapular strength to >/= 4+/5 with R grip strength symmetrical to or greater than L for functional UE use. Baseline: Refer to above UE MMT table Goal status: IN PROGRESS  7.  Patient will be able to resume normal sleeping position without sleep disturbance due to pain or R UE radiculopathy. Baseline: Currently having to sleep upright with R UE supported on pillow Goal status: IN PROGRESS   8.  Patient will report </= 15% on NDI to demonstrate improved functional ability.  Baseline: 15 / 50 = 30.0 % Goal status: IN PROGRESS  9.  Patient will </= 43% on QuickDASH to demonstrate improved functional ability   Baseline: 56.8 / 100 = 56.8 % Goal status: IN PROGRESS    PLAN:  PT FREQUENCY: 2x/week  PT DURATION: 8 weeks  PLANNED INTERVENTIONS: 97164- PT Re-evaluation,  97110-Therapeutic exercises, 97530- Therapeutic activity, 97112- Neuromuscular re-education, 97535- Self Care, 16109- Manual therapy, [id number]- Electrical stimulation (unattended), 97035- Ultrasound, 60454- Traction (mechanical), [id number]- Ionotophoresis 4mg /ml Dexamethasone, Patient/Family education, Taping, Dry Needling, Joint mobilization, Spinal mobilization, Cryotherapy, and Moist heat  PLAN FOR NEXT SESSION: Assess response to TPDN; assess STG #2; progress postural strengthening and stretching; MT +/- TPDN to address abnormal muscle tension in neck and upper shoulders; modalities including possible cervical traction and/or [place]   Marry Guan, PT 03/07/2024, 5:56 PM

## 2024-03-11 ENCOUNTER — Other Ambulatory Visit: Payer: Self-pay

## 2024-03-12 ENCOUNTER — Ambulatory Visit

## 2024-03-12 DIAGNOSIS — M62838 Other muscle spasm: Secondary | ICD-10-CM | POA: Diagnosis not present

## 2024-03-12 DIAGNOSIS — M6281 Muscle weakness (generalized): Secondary | ICD-10-CM

## 2024-03-12 DIAGNOSIS — M5412 Radiculopathy, cervical region: Secondary | ICD-10-CM | POA: Diagnosis not present

## 2024-03-12 DIAGNOSIS — R293 Abnormal posture: Secondary | ICD-10-CM | POA: Diagnosis not present

## 2024-03-12 NOTE — Therapy (Addendum)
 OUTPATIENT PHYSICAL THERAPY TREATMENT   Patient Name: Tara Rosales MRN: [id number] DOB:[birth year], 43 y.o., female Today's Date: 03/12/2024   END OF SESSION:  PT End of Session - 03/12/24 1651     Visit Number 6    Date for PT Re-Evaluation 04/09/24    Authorization Type BCBS    PT Start Time 1615    PT Stop Time 1700    PT Time Calculation (min) 45 min    Activity Tolerance Patient tolerated treatment well    Behavior During Therapy [facility] for tasks assessed/performed                  Past Medical History:  Diagnosis Date   Abnormal uterine bleeding (AUB)    Allergic rhinitis    History of cervical dysplasia    s/p  LEEP  in 2004    in gyn office   Hypothyroidism due to Hashimoto's thyroiditis 09/19/2016   pcp and endocrinologist-- Johnella Moloney NP   Myofascial pain syndrome 10/14/2022   Positive ANA (antinuclear antibody) 10/14/2022   work-up done by rheumologist--- dr s. Corliss Skains   Wears glasses    Past Surgical History:  Procedure Laterality Date   BREAST REDUCTION SURGERY Bilateral 1999   CERVICAL BIOPSY  W/ LOOP ELECTRODE EXCISION  2004   DILITATION & CURRETTAGE/HYSTROSCOPY WITH NOVASURE ABLATION N/A 11/16/2022   Procedure: DILATATION & CURETTAGE/HYSTEROSCOPY WITH NOVASURE ABLATION;  Surgeon: Lorriane Shire, MD;  Location: [facility];  Service: Gynecology;  Laterality: N/A;   LYMPH NODE DISSECTION  2001   per pt under chin for enlargement,  benign   TONSILLECTOMY AND ADENOIDECTOMY  1992   child   WISDOM TOOTH EXTRACTION  1995   Patient Active Problem List   Diagnosis Date Noted   Abnormal uterine bleeding 11/16/2022   Hypothyroidism 09/19/2016   Vitamin D deficiency 03/26/2015   Abnormal Pap smear of cervix 07/24/2014    Class: History of   Asthma 05/07/2011    PCP: Sharlene Dory, DO   REFERRING PROVIDER: London Sheer, MD   REFERRING DIAG: [phone] (ICD-10-CM) - Radiculopathy, cervical region   THERAPY DIAG:   Radiculopathy, cervical region  Abnormal posture  Muscle weakness (generalized)  Other muscle spasm  RATIONALE FOR EVALUATION AND TREATMENT: Rehabilitation  ONSET DATE: 01/02/24  NEXT MD VISIT: 04/10/24   SUBJECTIVE:  SUBJECTIVE STATEMENT: 03/12/24 - Pt reports her pain is a 2/10 right now, really thinking the DN is helping her   IE: Pt reports she woke up 1 morning in the end of January with tingling in her R arm which became more painful as the day progressed.  Went to urgent care and was told she had a pinched nerve - given pain shot and muscle relaxants but pain continued to worsen.  Went to ED the next day and given oxy but pain still continued to worsen.  F/u with PCP, where she was given cortisone injection and sent for cervical x-rays.  Pain better since steroid injection but still had constant numbness in ulnar distribution. Given a steroid pack 2 weeks ago and now numbness more intermittent.  Notes increased fatigue with all activities using R UE.  Currently unable to sleep except sitting up with pillow under R UE.  Hand dominance: Right  PAIN: Are you having pain? Yes: NPRS scale: 2/10 Pain location: R upper/posterior shoulder and down posterolateral R UE to elbow, numbness into 4th and 5th digits Pain description: radiating, slight throbbing Aggravating factors: typing, using computer mouse, washing hair, lying down to sleep Relieving factors: steroids, otherwise not much relief from meds; ice or heat; theragun  PERTINENT HISTORY:  Hypothyroidism due to Hashimoto's thyroiditis, myofascial pain syndrome, breast reduction surgery, asthma  PRECAUTIONS: None  RED FLAGS: None  HAND DOMINANCE: Right  WEIGHT BEARING RESTRICTIONS: No  FALLS:  Has patient fallen in last 6  months? 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  Grade 1 spondylolisthesis at C4/5 that shifts less than 0.5 mm between flexion and extension views.  No fracture or dislocation seen.   01/05/24 - DG cervical spine: IMPRESSION: Multilevel degenerative changes of the cervical spine, worst at C5-C6 and C6-C7.  Per Dr Cherre Robins, anterolisthesis C4 on C5, decreased disc height loss C5-C6, C6-C7, signs of facet arthropathy, vertebral body osteophytosis more pronounced C5-C6 > C6-C7.   01/28/24 - DG right shoulder: IMPRESSION: No acute osseous abnormality of the right shoulder. No significant arthritis.  2024/01/28 - DG right elbow: IMPRESSION: No acute osseous abnormality of the right elbow.   PATIENT SURVEYS:  NDI 15 / 50 = 30.0 % Quick Dash 56.8 / 100 = 56.8 %  COGNITION: Overall cognitive status: Within functional limits for tasks assessed  SENSATION: Periodic numbness and paresthesias in her ulnar forearm and hand.  POSTURE:  rounded shoulders, forward head, and mildly protracted R shoulder  PALPATION: Increased muscle tension and TTP in R>L UT, LS and medial periscapular muscles   CERVICAL ROM:   Active ROM Eval  Flexion 30 p! into upper back  Extension 53  Right lateral flexion 19 p! R upper shoulder  Left lateral flexion 20 p! R upper shoulder  Right rotation 58  Left rotation 54   (Blank rows = not tested)  UPPER  EXTREMITY ROM:  Active ROM Right eval Left eval  Shoulder flexion 112 p! 141  Shoulder extension 34  42  Shoulder abduction 156 162  Shoulder adduction    Shoulder internal rotation FIR [facility] FIR [facility]  Shoulder external rotation FER - unable FER WFL  Elbow flexion    Elbow extension    Wrist flexion    Wrist extension    Wrist ulnar deviation    Wrist radial deviation    Wrist pronation    Wrist supination     (Blank rows = not tested)  UPPER EXTREMITY MMT:  MMT Right eval Left eval  Shoulder flexion 4 5  Shoulder extension 4 4+  Shoulder abduction 4+ 5  Shoulder adduction    Shoulder internal rotation 4+ 5  Shoulder external rotation 4 4+  Middle trapezius 4- 4  Lower trapezius 3- 3  Elbow flexion    Elbow extension    Wrist flexion    Wrist extension    Wrist ulnar deviation    Wrist radial deviation    Wrist pronation    Wrist supination    Grip strength 31# 37.33#   (Blank rows = not tested)  CERVICAL SPECIAL TESTS:  Spurling's test: Negative and Distraction test: Negative - slight reduction in R UE numbness and tingling with manual cervical distraction   TODAY'S TREATMENT:   03/12/2024  THERAPEUTIC EXERCISE: To improve strength, endurance, and ROM.  Demonstration, verbal and tactile cues throughout for technique. UBE: L1.5 x 6 min (3' each fwd & back) Cat cow in quadruped x 10  Thread the needle bil x 10  Open books bil x 10  Chin tucks supine 10x3"- pt reported increased radicular p! Supine chin tucks + cervical rotation 10x3"  MANUAL THERAPY: To promote normalized muscle tension, improved flexibility, and reduced pain. Trigger Point Dry Needling: (All TPDN performed by Glenetta Hew, PT) Treatment instructions/education: Subsequent Treatment: Instructions provided previously at initial dry needling treatment.  Education Handout Provided: Previously Provided Consent: Patient Verbal Consent Given: Yes Treatment: Muscles Treated: R UT x 2 and LS Skilled  palpation and monitoring of soft tissue during DN Electrical Stimulation Performed: No Treatment Response/Outcome: Twitch response elicited, Palpable increase in muscle length, Decreased tissue resistance noted, and Decreased TTP STM/DTM, manual TPR and pin & stretch to muscles addressed with DN   03/07/2024  THERAPEUTIC EXERCISE: To improve strength, endurance, and ROM.  Demonstration, verbal and tactile cues throughout for technique. UBE: L1.5 x 6 min (3' each fwd & back)  MANUAL THERAPY: To promote normalized muscle tension, improved flexibility, improved joint mobility, reduced pain, and reduce radicular symptoms . Trigger Point Dry Needling: Treatment instructions/education: Initial Treatment: Pt instructed on Dry Needling rational, procedures, and possible side effects. Pt instructed to expect mild to moderate muscle soreness later in the day and/or into the next day.  Pt instructed in methods to reduce muscle soreness. Pt instructed to continue prescribed HEP. Because Dry Needling was performed over or adjacent to a lung field, pt was educated on S/S of pneumothorax and to seek immediate medical attention should they occur.  Patient was educated on signs and symptoms of infection and other risk factors and advised to seek medical attention should they occur.  Patient verbalized understanding of these instructions and education.  Education Handout Provided: Previously Provided Consent: Patient Verbal Consent Given: Yes Treatment: Muscles Treated: B UT, LS, cervical multifidi Skilled palpation and monitoring of soft tissue during DN Electrical Stimulation Performed: No Treatment Response/Outcome: Twitch response elicited, Palpable increase in muscle length, Decreased tissue resistance noted, Decreased TTP, and Improved exercise tolerance STM/DTM, manual TPR and pin &  stretch to muscles addressed with DN  NEUROMUSCULAR RE-EDUCATION: To improve  UE radicular symptoms .  R brachial  plexus nerve glides x 10   03/05/24 Therapeutic Exercise: to improve strength, ROM, flexibility, and endurance  Nustep L4x9min UE/LE Tricep stretch arm overhead x 30" Tricep stretch arm supported on chair in front x 30" Manual Therapy: to decrease muscle spasm, pain and improve mobility. STM and TPR to R infraspinatus, teres group, posterior deltoids, triceps Trial of cervical traction- increased tingling down arm therefore discontinued   02/27/24 Therapeutic Exercise: to improve strength, ROM, flexibility, and endurance  UBE 3 min both ways  Seated SNAG cervical extension 2 x 10 SNAG bil cervical rotation x 10 NEUROMUSCULAR RE-EDUCATION: To improve proprioception, balance, and kinesthesia. Thoracic extension back over chair x 10  Standing rows RTB x 10 Standing shoulder extension RTB x 10  Manual Therapy: to decrease muscle spasm, pain and improve mobility.  STM to R UT and LS    PATIENT EDUCATION:  Education details: HEP update - open book, thread the needle   Person educated: Patient Education method: Explanation, Demonstration, Verbal cues, and Handouts Education comprehension: verbalized understanding, returned demonstration, verbal cues required, and needs further education  HOME EXERCISE PROGRAM: Access Code: [email or web address] URL: [email or web address] Date: 03/12/2024 Prepared by: Verta Ellen  Exercises - Supine Thoracic Mobilization Towel Roll Vertical with Arm Stretch  - 1-2 x daily - 7 x weekly - 2 sets - 10 reps - 3 sec hold - Ulnar Nerve Glide- Full Arm  - 1 x daily - 7 x weekly - 2 sets - 10 reps - 3 sec hold - Standing Ulnar Nerve Glide  - 1 x daily - 7 x weekly - 2 sets - 10 reps - 3 sec hold - Shoulder External Rotation and Scapular Retraction with Resistance  - 1 x daily - 7 x weekly - 2 sets - 10 reps - Standing Shoulder Horizontal Abduction with Resistance  - 1 x daily - 7 x weekly - 2 sets - 10 reps - Cervical Retraction with Resistance  - 1 x  daily - 7 x weekly - 2 sets - 10 reps - Cervical Extension AROM with Strap  - 2 x daily - 7 x weekly - 2 sets - 10 reps - Seated Assisted Cervical Rotation with Towel  - 2 x daily - 7 x weekly - 2 sets - 10 reps - Sidelying Thoracic Rotation with Open Book  - 1 x daily - 7 x weekly - 2 sets - 10 reps - 5 sec hold - Quadruped Thoracic Rotation - Reach Under  - 1 x daily - 7 x weekly - 2 sets - 10 reps - 5 sec hold  Patient Education - Brachial plexus nerve glide   ASSESSMENT:  CLINICAL IMPRESSION: Adjoa reports her pain is better than it was at her last PT visit  and notes great  benefit from DN. She has met her final STG.  Addressed tissue tension with stretching and ROM exercises. She started to get radicular sx after the chin tucks and cervical rotation. DN was performed by her supervising PT post session, due to good response last visit. Candra will benefit from continued skilled PT to address ongoing deficits to improve mobility and activity tolerance with decreased pain interference.  She is still awaiting consult with Dr. Alvester Morin regarding possible cervical ESI to address ongoing pain and radicular symptoms.  OBJECTIVE IMPAIRMENTS: decreased activity tolerance, decreased knowledge of condition, decreased ROM, decreased strength, hypomobility, increased  fascial restrictions, impaired perceived functional ability, increased muscle spasms, impaired flexibility, impaired sensation, impaired UE functional use, improper body mechanics, postural dysfunction, and pain.   ACTIVITY LIMITATIONS: carrying, lifting, sleeping, bathing, dressing, reach over head, and hygiene/grooming  PARTICIPATION LIMITATIONS: meal prep, cleaning, laundry, driving, shopping, community activity, and occupation  PERSONAL FACTORS: Past/current experiences, Time since onset of injury/illness/exacerbation, and 3+ comorbidities: Hypothyroidism due to Hashimoto's thyroiditis, myofascial pain syndrome, breast reduction  surgery, asthma  are also affecting patient's functional outcome.   REHAB POTENTIAL: Good  CLINICAL DECISION MAKING: Evolving/moderate complexity  EVALUATION COMPLEXITY: Moderate   GOALS: Goals reviewed with patient? Yes  SHORT TERM GOALS: Target date: 03/12/2024  Patient will be independent with initial HEP to improve outcomes and carryover.  Baseline:  Goal status: MET - 02/27/24  2.  Patient will report 25% improvement in neck pain and R UE radiculopathy to improve QOL.  Baseline: 5/10 on eval, up to 7-8/10 Goal status: MET - 03/12/24  LONG TERM GOALS: Target date: 04/09/2024  Patient will be independent with ongoing/advanced HEP for self-management at home.  Baseline:  Goal status: IN PROGRESS  2.  Patient will demonstrate improved posture to decrease muscle imbalance. Baseline:  Goal status: IN PROGRESS  3.  Patient will report 50-75% improvement in neck pain and RUE radiculopathy to improve QOL.  Baseline: 5/10 on eval, up to 7-8/10 Goal status: IN PROGRESS  4.  Patient will demonstrate full pain free cervical ROM for safety with driving.   Baseline: Refer to above cervical ROM table Goal status: IN PROGRESS   5.  Patient will demonstrate full pain free R shoulder ROM for increased independence with grooming and self-care.  Baseline: Refer to above UE ROM table Goal status: IN PROGRESS  6. Patient will demonstrate improved R shoulder and scapular strength to >/= 4+/5 with R grip strength symmetrical to or greater than L for functional UE use. Baseline: Refer to above UE MMT table Goal status: IN PROGRESS  7.  Patient will be able to resume normal sleeping position without sleep disturbance due to pain or R UE radiculopathy. Baseline: Currently having to sleep upright with R UE supported on pillow Goal status: IN PROGRESS   8.  Patient will report </= 15% on NDI to demonstrate improved functional ability.  Baseline: 15 / 50 = 30.0 % Goal status: IN PROGRESS  9.   Patient will </= 43% on QuickDASH to demonstrate improved functional ability   Baseline: 56.8 / 100 = 56.8 % Goal status: IN PROGRESS    PLAN:  PT FREQUENCY: 2x/week  PT DURATION: 8 weeks  PLANNED INTERVENTIONS: 97164- PT Re-evaluation, 97110-Therapeutic exercises, 97530- Therapeutic activity, [id number]- Neuromuscular re-education, 97535- Self Care, 86578- Manual therapy, [id number]- Electrical stimulation (unattended), 97035- Ultrasound, 46962- Traction (mechanical), 97033- Ionotophoresis 4mg /ml Dexamethasone, Patient/Family education, Taping, Dry Needling, Joint mobilization, Spinal mobilization, Cryotherapy, and Moist heat  PLAN FOR NEXT SESSION: progress postural strengthening and stretching; MT +/- TPDN to address abnormal muscle tension in neck and upper shoulders; modalities including possible cervical traction and/or [place]   Darleene Cleaver, PTA 03/12/2024, 5:09 PM

## 2024-03-14 ENCOUNTER — Other Ambulatory Visit: Payer: Self-pay

## 2024-03-14 ENCOUNTER — Ambulatory Visit: Admitting: Physical Medicine and Rehabilitation

## 2024-03-14 ENCOUNTER — Ambulatory Visit: Admitting: Physical Therapy

## 2024-03-14 VITALS — BP 113/79 | HR 82

## 2024-03-14 DIAGNOSIS — M5412 Radiculopathy, cervical region: Secondary | ICD-10-CM

## 2024-03-14 MED ORDER — METHYLPREDNISOLONE ACETATE 40 MG/ML IJ SUSP
40.0000 mg | Freq: Once | INTRAMUSCULAR | Status: AC
  Administered 2024-03-14: 40 mg

## 2024-03-14 NOTE — Patient Instructions (Signed)

## 2024-03-14 NOTE — Progress Notes (Signed)
 Tara Rosales - 43 y.o. female MRN [id number]  Date of birth: 25-Sep-1981  Office Visit Note: Visit Date: 03/14/2024 PCP: Jobe Mulder, DO Referred by: Diedra Fowler, MD  Subjective: Chief Complaint  Patient presents with   Neck - Pain   HPI:  Tara Rosales is a 43 y.o. female who comes in today at the request of Dr. Colette Davies for planned Right C7-T1 Cervical Interlaminar epidural steroid injection with fluoroscopic guidance.  The patient has failed conservative care including home exercise, medications, time and activity modification.  This injection will be diagnostic and hopefully therapeutic.  Please see requesting physician notes for further details and justification.   ROS Otherwise per HPI.  Assessment & Plan: Visit Diagnoses:    ICD-10-CM   1. Radiculopathy, cervical region  M54.12 XR C-ARM NO REPORT    Epidural Steroid injection    methylPREDNISolone acetate (DEPO-MEDROL) injection 40 mg      Plan: No additional findings.   Meds & Orders:  Meds ordered this encounter  Medications   methylPREDNISolone acetate (DEPO-MEDROL) injection 40 mg    Orders Placed This Encounter  Procedures   XR C-ARM NO REPORT   Epidural Steroid injection    Follow-up: Return for visit to requesting provider as needed.   Procedures: No procedures performed  Cervical Epidural Steroid Injection - Interlaminar Approach with Fluoroscopic Guidance  Patient: Tara Rosales      Date of Birth: November 26, 1981 MRN: [id number] PCP: Jobe Mulder, DO      Visit Date: 03/14/2024   Universal Protocol:    Date/Time: 04/16/258:00 AM  Consent Given By: the patient  Position: PRONE  Additional Comments: Vital signs were monitored before and after the procedure. Patient was prepped and draped in the usual sterile fashion. The correct patient, procedure, and site was verified.   Injection Procedure Details:   Procedure diagnoses: Radiculopathy, cervical  region [M54.12]    Meds Administered:  Meds ordered this encounter  Medications   methylPREDNISolone acetate (DEPO-MEDROL) injection 40 mg     Laterality: Right  Location/Site: C7-T1  Needle: 3.5 in., 20 ga. Tuohy  Needle Placement: Paramedian epidural space  Findings:  -Comments: Excellent flow of contrast into the epidural space.  Procedure Details: Using a paramedian approach from the side mentioned above, the region overlying the inferior lamina was localized under fluoroscopic visualization and the soft tissues overlying this structure were infiltrated with 4 ml. of 1% Lidocaine without Epinephrine. A # 20 gauge, Tuohy needle was inserted into the epidural space using a paramedian approach.  The epidural space was localized using loss of resistance along with contralateral oblique bi-planar fluoroscopic views.  After negative aspirate for air, blood, and CSF, a 2 ml. volume of Isovue-250 was injected into the epidural space and the flow of contrast was observed. Radiographs were obtained for documentation purposes.   The injectate was administered into the level noted above.  Additional Comments:  The patient tolerated the procedure well Dressing: 2 x 2 sterile gauze and Band-Aid    Post-procedure details: Patient was observed during the procedure. Post-procedure instructions were reviewed.  Patient left the clinic in stable condition.   Clinical History: No specialty comments available.     Objective:  VS:  HT:    WT:   BMI:     BP:113/79  HR:82bpm  TEMP: ( )  RESP:  Physical Exam Vitals and nursing note reviewed.  Constitutional:      General: She is not in acute distress.  Appearance: Normal appearance. She is not ill-appearing.  HENT:     Head: Normocephalic and atraumatic.     Right Ear: External ear normal.     Left Ear: External ear normal.  Eyes:     Extraocular Movements: Extraocular movements intact.  Cardiovascular:     Rate and Rhythm:  Normal rate.     Pulses: Normal pulses.  Musculoskeletal:     Cervical back: Tenderness present. No rigidity.     Right lower leg: No edema.     Left lower leg: No edema.     Comments: Patient has good strength in the upper extremities including 5 out of 5 strength in wrist extension long finger flexion and APB.  There is no atrophy of the hands intrinsically.  There is a negative Hoffmann's test.   Lymphadenopathy:     Cervical: No cervical adenopathy.  Skin:    Findings: No erythema, lesion or rash.  Neurological:     General: No focal deficit present.     Mental Status: She is alert and oriented to person, place, and time.     Sensory: No sensory deficit.     Motor: No weakness or abnormal muscle tone.     Coordination: Coordination normal.  Psychiatric:        Mood and Affect: Mood normal.        Behavior: Behavior normal.      Imaging: No results found.

## 2024-03-14 NOTE — Progress Notes (Signed)
 Pain Scale   Average Pain 2 Patient advised her pain increases with typing and it causes numbness and tingling in her right arm to hand        +Driver, -BT, -Dye Allergies.

## 2024-03-20 NOTE — Procedures (Signed)
 Cervical Epidural Steroid Injection - Interlaminar Approach with Fluoroscopic Guidance  Patient: Tara Rosales      Date of Birth: 07-12-1981 MRN: [id number] PCP: Jobe Mulder, DO      Visit Date: 03/14/2024   Universal Protocol:    Date/Time: 04/16/258:00 AM  Consent Given By: the patient  Position: PRONE  Additional Comments: Vital signs were monitored before and after the procedure. Patient was prepped and draped in the usual sterile fashion. The correct patient, procedure, and site was verified.   Injection Procedure Details:   Procedure diagnoses: Radiculopathy, cervical region [M54.12]    Meds Administered:  Meds ordered this encounter  Medications   methylPREDNISolone acetate (DEPO-MEDROL) injection 40 mg     Laterality: Right  Location/Site: C7-T1  Needle: 3.5 in., 20 ga. Tuohy  Needle Placement: Paramedian epidural space  Findings:  -Comments: Excellent flow of contrast into the epidural space.  Procedure Details: Using a paramedian approach from the side mentioned above, the region overlying the inferior lamina was localized under fluoroscopic visualization and the soft tissues overlying this structure were infiltrated with 4 ml. of 1% Lidocaine without Epinephrine. A # 20 gauge, Tuohy needle was inserted into the epidural space using a paramedian approach.  The epidural space was localized using loss of resistance along with contralateral oblique bi-planar fluoroscopic views.  After negative aspirate for air, blood, and CSF, a 2 ml. volume of Isovue-250 was injected into the epidural space and the flow of contrast was observed. Radiographs were obtained for documentation purposes.   The injectate was administered into the level noted above.  Additional Comments:  The patient tolerated the procedure well Dressing: 2 x 2 sterile gauze and Band-Aid    Post-procedure details: Patient was observed during the procedure. Post-procedure  instructions were reviewed.  Patient left the clinic in stable condition.

## 2024-03-27 ENCOUNTER — Ambulatory Visit

## 2024-03-27 DIAGNOSIS — M6281 Muscle weakness (generalized): Secondary | ICD-10-CM

## 2024-03-27 DIAGNOSIS — M62838 Other muscle spasm: Secondary | ICD-10-CM

## 2024-03-27 DIAGNOSIS — M5412 Radiculopathy, cervical region: Secondary | ICD-10-CM | POA: Diagnosis not present

## 2024-03-27 DIAGNOSIS — R293 Abnormal posture: Secondary | ICD-10-CM | POA: Diagnosis not present

## 2024-03-27 NOTE — Therapy (Signed)
 OUTPATIENT PHYSICAL THERAPY TREATMENT   Patient Name: Tara Rosales MRN: [id number] DOB:[birth year], 43 y.o., female Today's Date: 03/27/2024   END OF SESSION:  PT End of Session - 03/27/24 1709     Visit Number 7    Date for PT Re-Evaluation 04/09/24    Authorization Type BC[employer]    PT Start Time 1617    PT Stop Time 1700    PT Time Calculation (min) 43 min    Activity Tolerance Patient tolerated treatment well    Behavior During Therapy [facility] for tasks assessed/performed                   Past Medical History:  Diagnosis Date   Abnormal uterine bleeding (AUB)    Allergic rhinitis    History of cervical dysplasia    s/p  LEEP  in 2004    in gyn office   Hypothyroidism due to Hashimoto's thyroiditis 09/19/2016   pcp and endocrinologist-- Karis Outhouse NP   Myofascial pain syndrome 10/14/2022   Positive ANA (antinuclear antibody) 10/14/2022   work-up done by rheumologist--- dr s. Alvira Josephs   Wears glasses    Past Surgical History:  Procedure Laterality Date   BREAST REDUCTION SURGERY Bilateral 1999   CERVICAL BIOPSY  W/ LOOP ELECTRODE EXCISION  2004   DILITATION & CURRETTAGE/HYSTROSCOPY WITH NOVASURE ABLATION N/A 11/16/2022   Procedure: DILATATION & CURETTAGE/HYSTEROSCOPY WITH NOVASURE ABLATION;  Surgeon: Kiki Pelton, MD;  Location: [facility];  Service: Gynecology;  Laterality: N/A;   LYMPH NODE DISSECTION  2001   per pt under chin for enlargement,  benign   TONSILLECTOMY AND ADENOIDECTOMY  1992   child   WISDOM TOOTH EXTRACTION  1995   Patient Active Problem List   Diagnosis Date Noted   Abnormal uterine bleeding 11/16/2022   Hypothyroidism 09/19/2016   Vitamin D  deficiency 03/26/2015   Abnormal Pap smear of cervix 07/24/2014    Class: History of   Asthma 05/07/2011    PCP: Jobe Mulder, DO   REFERRING PROVIDER: Diedra Fowler, MD   REFERRING DIAG: [phone] (ICD-10-CM) - Radiculopathy, cervical region   THERAPY  DIAG:  Radiculopathy, cervical region  Abnormal posture  Muscle weakness (generalized)  Other muscle spasm  RATIONALE FOR EVALUATION AND TREATMENT: Rehabilitation  ONSET DATE: 01/02/24  NEXT MD VISIT: 04/10/24   SUBJECTIVE:  SUBJECTIVE STATEMENT: Pt reports that she got a neck injection two weeks ago, it helped a litle bit  IE: Pt reports she woke up 1 morning in the end of January 17, 2024 with tingling in her R arm which became more painful as the day progressed.  Went to urgent care and was told she had a pinched nerve - given pain shot and muscle relaxants but pain continued to worsen.  Went to ED the next day and given oxy but pain still continued to worsen.  F/u with PCP, where she was given cortisone injection and sent for cervical x-rays.  Pain better since steroid injection but still had constant numbness in ulnar distribution. Given a steroid pack 2 weeks ago and now numbness more intermittent.  Notes increased fatigue with all activities using R UE.  Currently unable to sleep except sitting up with pillow under R UE.  Hand dominance: Right  PAIN: Are you having pain? Yes: NPRS scale: 2/10 Pain location: R upper/posterior shoulder and down posterolateral R UE to elbow, numbness into 4th and 5th digits Pain description: radiating, slight throbbing Aggravating factors: typing, using computer mouse, washing hair, lying down to sleep Relieving factors: steroids, otherwise not much relief from meds; ice or heat; theragun  PERTINENT HISTORY:  Hypothyroidism due to Hashimoto's thyroiditis, myofascial pain syndrome, breast reduction surgery, asthma  PRECAUTIONS: None  RED FLAGS: None  HAND DOMINANCE: Right  WEIGHT BEARING RESTRICTIONS: No  FALLS:  Has patient fallen in last 6 months?  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  Grade 1 spondylolisthesis at C4/5 that shifts less than 0.5 mm between flexion and extension views.  No fracture or dislocation seen.   01/05/24 - DG cervical spine: IMPRESSION: Multilevel degenerative changes of the cervical spine, worst at C5-C6 and C6-C7.  Per Dr Liz Rigger, anterolisthesis C4 on C5, decreased disc height loss C5-C6, C6-C7, signs of facet arthropathy, vertebral body osteophytosis more pronounced C5-C6 > C6-C7.   Jan 17, 2024 - DG right shoulder: IMPRESSION: No acute osseous abnormality of the right shoulder. No significant arthritis.  2024-01-17 - DG right elbow: IMPRESSION: No acute osseous abnormality of the right elbow.   PATIENT SURVEYS:  NDI 15 / 50 = 30.0 % Quick Dash 56.8 / 100 = 56.8 %  COGNITION: Overall cognitive status: Within functional limits for tasks assessed  SENSATION: Periodic numbness and paresthesias in her ulnar forearm and hand.  POSTURE:  rounded shoulders, forward head, and mildly protracted R shoulder  PALPATION: Increased muscle tension and TTP in R>L UT, LS and medial periscapular muscles   CERVICAL ROM:   Active ROM Eval  Flexion 30 p! into upper back  Extension 53  Right lateral flexion 19 p! R upper shoulder  Left lateral flexion 20 p! R upper shoulder  Right rotation 58  Left rotation 54   (Blank rows = not tested)  UPPER EXTREMITY  ROM:  Active ROM Right eval Left eval R 03/27/24  Shoulder flexion 112 p! 141 140  Shoulder extension 34  42   Shoulder abduction 156 162 141  Shoulder adduction     Shoulder internal rotation FIR [facility] FIR [facility] WFL  Shoulder external rotation FER - unable FER WFL To top of scapula  Elbow flexion     Elbow extension     Wrist flexion     Wrist extension     Wrist ulnar deviation     Wrist radial deviation     Wrist pronation     Wrist supination      (Blank rows = not tested)  UPPER EXTREMITY MMT:  MMT Right eval Left eval  Shoulder flexion 4 5  Shoulder extension 4 4+  Shoulder abduction 4+ 5  Shoulder adduction    Shoulder internal rotation 4+ 5  Shoulder external rotation 4 4+  Middle trapezius 4- 4  Lower trapezius 3- 3  Elbow flexion    Elbow extension    Wrist flexion    Wrist extension    Wrist ulnar deviation    Wrist radial deviation    Wrist pronation    Wrist supination    Grip strength 31# 37.33#   (Blank rows = not tested)  CERVICAL SPECIAL TESTS:  Spurling's test: Negative and Distraction test: Negative - slight reduction in R UE numbness and tingling with manual cervical distraction   TODAY'S TREATMENT:  03/27/2024  THERAPEUTIC EXERCISE: To improve strength, endurance, and ROM.  Demonstration, verbal and tactile cues throughout for technique. UBE: L2 x 6 min (3' each fwd & back) L LS stretch 3x30" Open book standing 10x B  MANUAL THERAPY: To promote normalized muscle tension, improved flexibility, and reduced pain. STM to UT, LS, anterior deltoids, biceps 03/12/2024  THERAPEUTIC EXERCISE: To improve strength, endurance, and ROM.  Demonstration, verbal and tactile cues throughout for technique. UBE: L1.5 x 6 min (3' each fwd & back) Cat cow in quadruped x 10  Thread the needle bil x 10  Open books bil x 10  Chin tucks supine 10x3"- pt reported increased radicular p! Supine chin tucks + cervical rotation 10x3"  MANUAL THERAPY: To promote normalized  muscle tension, improved flexibility, and reduced pain. Trigger Point Dry Needling: (All TPDN performed by Felecia Hopper, PT) Treatment instructions/education: Subsequent Treatment: Instructions provided previously at initial dry needling treatment.  Education Handout Provided: Previously Provided Consent: Patient Verbal Consent Given: Yes Treatment: Muscles Treated: R UT x 2 and LS Skilled palpation and monitoring of soft tissue during DN Electrical Stimulation Performed: No Treatment Response/Outcome: Twitch response elicited, Palpable increase in muscle length, Decreased tissue resistance noted, and Decreased TTP STM/DTM, manual TPR and pin & stretch to muscles addressed with DN   03/07/2024  THERAPEUTIC EXERCISE: To improve strength, endurance, and ROM.  Demonstration, verbal and tactile cues throughout for technique. UBE: L1.5 x 6 min (3' each fwd & back)  MANUAL THERAPY: To promote normalized muscle tension, improved flexibility, improved joint mobility, reduced pain, and reduce radicular symptoms . Trigger Point Dry Needling: Treatment instructions/education: Initial Treatment: Pt instructed on Dry Needling rational, procedures, and possible side effects. Pt instructed to expect mild to moderate muscle soreness later in the day and/or into the next day.  Pt instructed in methods to reduce muscle soreness. Pt instructed to continue prescribed HEP. Because Dry Needling was performed over or adjacent to a lung field, pt was educated on S/S of pneumothorax and to seek immediate medical attention should they occur.  Patient was educated on signs and symptoms of infection and other risk factors and advised to  seek medical attention should they occur.  Patient verbalized understanding of these instructions and education.  Education Handout Provided: Previously Provided Consent: Patient Verbal Consent Given: Yes Treatment: Muscles Treated: B UT, LS, cervical multifidi Skilled palpation  and monitoring of soft tissue during DN Electrical Stimulation Performed: No Treatment Response/Outcome: Twitch response elicited, Palpable increase in muscle length, Decreased tissue resistance noted, Decreased TTP, and Improved exercise tolerance STM/DTM, manual TPR and pin & stretch to muscles addressed with DN  NEUROMUSCULAR RE-EDUCATION: To improve  UE radicular symptoms .  R brachial plexus nerve glides x 10   03/05/24 Therapeutic Exercise: to improve strength, ROM, flexibility, and endurance  Nustep L4x7min UE/LE Tricep stretch arm overhead x 30" Tricep stretch arm supported on chair in front x 30" Manual Therapy: to decrease muscle spasm, pain and improve mobility. STM and TPR to R infraspinatus, teres group, posterior deltoids, triceps Trial of cervical traction- increased tingling down arm therefore discontinued   02/27/24 Therapeutic Exercise: to improve strength, ROM, flexibility, and endurance  UBE 3 min both ways  Seated SNAG cervical extension 2 x 10 SNAG bil cervical rotation x 10 NEUROMUSCULAR RE-EDUCATION: To improve proprioception, balance, and kinesthesia. Thoracic extension back over chair x 10  Standing rows RTB x 10 Standing shoulder extension RTB x 10  Manual Therapy: to decrease muscle spasm, pain and improve mobility.  STM to R UT and LS    PATIENT EDUCATION:  Education details: HEP update - open book, thread the needle   Person educated: Patient Education method: Explanation, Demonstration, Verbal cues, and Handouts Education comprehension: verbalized understanding, returned demonstration, verbal cues required, and needs further education  HOME EXERCISE PROGRAM: Access Code: [email or web address] URL: [email or web address] Date: 03/12/2024 Prepared by: Ephrata Verville  Exercises - Supine Thoracic Mobilization Towel Roll Vertical with Arm Stretch  - 1-2 x daily - 7 x weekly - 2 sets - 10 reps - 3 sec hold - Ulnar Nerve Glide- Full Arm  - 1 x daily -  7 x weekly - 2 sets - 10 reps - 3 sec hold - Standing Ulnar Nerve Glide  - 1 x daily - 7 x weekly - 2 sets - 10 reps - 3 sec hold - Shoulder External Rotation and Scapular Retraction with Resistance  - 1 x daily - 7 x weekly - 2 sets - 10 reps - Standing Shoulder Horizontal Abduction with Resistance  - 1 x daily - 7 x weekly - 2 sets - 10 reps - Cervical Retraction with Resistance  - 1 x daily - 7 x weekly - 2 sets - 10 reps - Cervical Extension AROM with Strap  - 2 x daily - 7 x weekly - 2 sets - 10 reps - Seated Assisted Cervical Rotation with Towel  - 2 x daily - 7 x weekly - 2 sets - 10 reps - Sidelying Thoracic Rotation with Open Book  - 1 x daily - 7 x weekly - 2 sets - 10 reps - 5 sec hold - Quadruped Thoracic Rotation - Reach Under  - 1 x daily - 7 x weekly - 2 sets - 10 reps - 5 sec hold  Patient Education - Brachial plexus nerve glide   ASSESSMENT:  CLINICAL IMPRESSION: Tara Rosales reports her pain is better since [facility] however still having tightness and radiating pain down R UE. She notes the most benefit from manual techniques so we focused on STM followed by gentle mobility exercises. Shoulder ROM flexion has improved, she denies pain with any shoulder motions  measured today. She notes 50% improvement in overall pain. Tara Rosales will benefit from continued skilled PT to address ongoing deficits to improve mobility and activity tolerance with decreased pain interference.   OBJECTIVE IMPAIRMENTS: decreased activity tolerance, decreased knowledge of condition, decreased ROM, decreased strength, hypomobility, increased fascial restrictions, impaired perceived functional ability, increased muscle spasms, impaired flexibility, impaired sensation, impaired UE functional use, improper body mechanics, postural dysfunction, and pain.   ACTIVITY LIMITATIONS: carrying, lifting, sleeping, bathing, dressing, reach over head, and hygiene/grooming  PARTICIPATION LIMITATIONS: meal prep, cleaning, laundry,  driving, shopping, community activity, and occupation  PERSONAL FACTORS: Past/current experiences, Time since onset of injury/illness/exacerbation, and 3+ comorbidities: Hypothyroidism due to Hashimoto's thyroiditis, myofascial pain syndrome, breast reduction surgery, asthma  are also affecting patient's functional outcome.   REHAB POTENTIAL: Good  CLINICAL DECISION MAKING: Evolving/moderate complexity  EVALUATION COMPLEXITY: Moderate   GOALS: Goals reviewed with patient? Yes  SHORT TERM GOALS: Target date: 03/12/2024  Patient will be independent with initial HEP to improve outcomes and carryover.  Baseline:  Goal status: MET - 02/27/24  2.  Patient will report 25% improvement in neck pain and R UE radiculopathy to improve QOL.  Baseline: 5/10 on eval, up to 7-8/10 Goal status: MET - 03/12/24  LONG TERM GOALS: Target date: 04/09/2024  Patient will be independent with ongoing/advanced HEP for self-management at home.  Baseline:  Goal status: IN PROGRESS  2.  Patient will demonstrate improved posture to decrease muscle imbalance. Baseline:  Goal status: IN PROGRESS  3.  Patient will report 50-75% improvement in neck pain and RUE radiculopathy to improve QOL.  Baseline: 5/10 on eval, up to 7-8/10 Goal status: IN PROGRESS- 03/27/24   4.  Patient will demonstrate full pain free cervical ROM for safety with driving.   Baseline: Refer to above cervical ROM table Goal status: IN PROGRESS   5.  Patient will demonstrate full pain free R shoulder ROM for increased independence with grooming and self-care.  Baseline: Refer to above UE ROM table Goal status: IN PROGRESS- 03/27/24  6. Patient will demonstrate improved R shoulder and scapular strength to >/= 4+/5 with R grip strength symmetrical to or greater than L for functional UE use. Baseline: Refer to above UE MMT table Goal status: IN PROGRESS  7.  Patient will be able to resume normal sleeping position without sleep disturbance due  to pain or R UE radiculopathy. Baseline: Currently having to sleep upright with R UE supported on pillow Goal status: IN PROGRESS   8.  Patient will report </= 15% on NDI to demonstrate improved functional ability.  Baseline: 15 / 50 = 30.0 % Goal status: IN PROGRESS  9.  Patient will </= 43% on QuickDASH to demonstrate improved functional ability   Baseline: 56.8 / 100 = 56.8 % Goal status: IN PROGRESS    PLAN:  PT FREQUENCY: 2x/week  PT DURATION: 8 weeks  PLANNED INTERVENTIONS: 97164- PT Re-evaluation, 97110-Therapeutic exercises, 97530- Therapeutic activity, 97112- Neuromuscular re-education, 97535- Self Care, 16109- Manual therapy, [id number]- Electrical stimulation (unattended), 97035- Ultrasound, 60454- Traction (mechanical), [id number]- Ionotophoresis 4mg /ml Dexamethasone , Patient/Family education, Taping, Dry Needling, Joint mobilization, Spinal mobilization, Cryotherapy, and Moist heat  PLAN FOR NEXT SESSION: progress postural strengthening and stretching; MT +/- TPDN to address abnormal muscle tension in neck and upper shoulders; modalities including possible cervical traction and/or [place]   Samuella Crocker, PTA 03/27/2024, 5:09 PM

## 2024-04-01 ENCOUNTER — Ambulatory Visit

## 2024-04-01 DIAGNOSIS — M62838 Other muscle spasm: Secondary | ICD-10-CM | POA: Diagnosis not present

## 2024-04-01 DIAGNOSIS — M6281 Muscle weakness (generalized): Secondary | ICD-10-CM

## 2024-04-01 DIAGNOSIS — M5412 Radiculopathy, cervical region: Secondary | ICD-10-CM

## 2024-04-01 DIAGNOSIS — R293 Abnormal posture: Secondary | ICD-10-CM

## 2024-04-01 NOTE — Therapy (Signed)
 OUTPATIENT PHYSICAL THERAPY TREATMENT   Patient Name: Tara Rosales MRN: [id number] DOB:Apr 15, 1981, 43 y.o., female Today's Date: 04/01/2024   END OF SESSION:  PT End of Session - 04/01/24 1717     Visit Number 8    Date for PT Re-Evaluation 04/09/24    Authorization Type BC[employer]    PT Start Time 1615    PT Stop Time 1715    PT Time Calculation (min) 60 min    Activity Tolerance Patient tolerated treatment well    Behavior During Therapy [facility] for tasks assessed/performed               Past Medical History:  Diagnosis Date   Abnormal uterine bleeding (AUB)    Allergic rhinitis    History of cervical dysplasia    s/p  LEEP  in 2004    in gyn office   Hypothyroidism due to Hashimoto's thyroiditis 09/19/2016   pcp and endocrinologist-- Karis Outhouse NP   Myofascial pain syndrome 10/14/2022   Positive ANA (antinuclear antibody) 10/14/2022   work-up done by rheumologist--- dr s. Alvira Josephs   Wears glasses    Past Surgical History:  Procedure Laterality Date   BREAST REDUCTION SURGERY Bilateral 1999   CERVICAL BIOPSY  W/ LOOP ELECTRODE EXCISION  2004   DILITATION & CURRETTAGE/HYSTROSCOPY WITH NOVASURE ABLATION N/A 11/16/2022   Procedure: DILATATION & CURETTAGE/HYSTEROSCOPY WITH NOVASURE ABLATION;  Surgeon: Kiki Pelton, MD;  Location: [facility];  Service: Gynecology;  Laterality: N/A;   LYMPH NODE DISSECTION  2001   per pt under chin for enlargement,  benign   TONSILLECTOMY AND ADENOIDECTOMY  1992   child   WISDOM TOOTH EXTRACTION  1995   Patient Active Problem List   Diagnosis Date Noted   Abnormal uterine bleeding 11/16/2022   Hypothyroidism 09/19/2016   Vitamin D  deficiency 03/26/2015   Abnormal Pap smear of cervix 07/24/2014    Class: History of   Asthma 05/07/2011    PCP: Jobe Mulder, DO   REFERRING PROVIDER: Diedra Fowler, MD   REFERRING DIAG: [phone] (ICD-10-CM) - Radiculopathy, cervical region   THERAPY DIAG:   Radiculopathy, cervical region  Abnormal posture  Muscle weakness (generalized)  Other muscle spasm  RATIONALE FOR EVALUATION AND TREATMENT: Rehabilitation  ONSET DATE: 01/02/24  NEXT MD VISIT: 04/10/24   SUBJECTIVE:                                                                                                                                                                                                         SUBJECTIVE STATEMENT:  Pt reports muscle fatigue mostly today, but still really tight in shoulders and neck  IE: Pt reports she woke up 1 morning in the end of January with tingling in her R arm which became more painful as the day progressed.  Went to urgent care and was told she had a pinched nerve - given pain shot and muscle relaxants but pain continued to worsen.  Went to ED the next day and given oxy but pain still continued to worsen.  F/u with PCP, where she was given cortisone injection and sent for cervical x-rays.  Pain better since steroid injection but still had constant numbness in ulnar distribution. Given a steroid pack 2 weeks ago and now numbness more intermittent.  Notes increased fatigue with all activities using R UE.  Currently unable to sleep except sitting up with pillow under R UE.  Hand dominance: Right  PAIN: Are you having pain? Yes: NPRS scale: 3/10 Pain location: R upper/posterior shoulder and down posterolateral R UE to elbow, numbness into 4th and 5th digits Pain description:   Aggravating factors: typing, using computer mouse, washing hair, lying down to sleep Relieving factors: steroids, otherwise not much relief from meds; ice or heat; theragun  PERTINENT HISTORY:  Hypothyroidism due to Hashimoto's thyroiditis, myofascial pain syndrome, breast reduction surgery, asthma  PRECAUTIONS: None  RED FLAGS: None  HAND DOMINANCE: Right  WEIGHT BEARING RESTRICTIONS: No  FALLS:  Has patient fallen in last 6 months? 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  Grade 1 spondylolisthesis at C4/5 that shifts less than 0.5 mm between flexion and extension views.  No fracture or dislocation seen.   01/05/24 - DG cervical spine: IMPRESSION: Multilevel degenerative changes of the cervical spine, worst at C5-C6 and C6-C7.  Per Dr Liz Rigger, anterolisthesis C4 on C5, decreased disc height loss C5-C6, C6-C7, signs of facet arthropathy, vertebral body osteophytosis more pronounced C5-C6 > C6-C7.   22-Jan-2024 - DG right shoulder: IMPRESSION: No acute osseous abnormality of the right shoulder. No significant arthritis.  2024-01-22 - DG right elbow: IMPRESSION: No acute osseous abnormality of the right elbow.   PATIENT SURVEYS:  NDI 15 / 50 = 30.0 % Quick Dash 56.8 / 100 = 56.8 %  COGNITION: Overall cognitive status: Within functional limits for tasks assessed  SENSATION: Periodic numbness and paresthesias in her ulnar forearm and hand.  POSTURE:  rounded shoulders, forward head, and mildly protracted R shoulder  PALPATION: Increased muscle tension and TTP in R>L UT, LS and medial periscapular muscles   CERVICAL ROM:   Active ROM Eval  Flexion 30 p! into upper back  Extension 53  Right lateral flexion 19 p! R upper shoulder  Left lateral flexion 20 p! R upper shoulder  Right rotation 58  Left rotation 54   (Blank rows = not tested)  UPPER EXTREMITY ROM:  Active ROM  Right eval Left eval R 03/27/24  Shoulder flexion 112 p! 141 140  Shoulder extension 34 42   Shoulder abduction  156 162 141  Shoulder adduction     Shoulder internal rotation FIR [facility] FIR [facility] WFL  Shoulder external rotation FER - unable FER WFL To top of scapula  Elbow flexion     Elbow extension     Wrist flexion     Wrist extension     Wrist ulnar deviation     Wrist radial deviation     Wrist pronation     Wrist supination      (Blank rows = not tested)  UPPER EXTREMITY MMT:  MMT Right eval Left eval  Shoulder flexion 4 5  Shoulder extension 4 4+  Shoulder abduction 4+ 5  Shoulder adduction    Shoulder internal rotation 4+ 5  Shoulder external rotation 4 4+  Middle trapezius 4- 4  Lower trapezius 3- 3  Elbow flexion    Elbow extension    Wrist flexion    Wrist extension    Wrist ulnar deviation    Wrist radial deviation    Wrist pronation    Wrist supination    Grip strength 31# 37.33#   (Blank rows = not tested)  CERVICAL SPECIAL TESTS:  Spurling's test: Negative and Distraction test: Negative - slight reduction in R UE numbness and tingling with manual cervical distraction   TODAY'S TREATMENT:  04/01/2024  THERAPEUTIC EXERCISE: To improve strength, endurance, and ROM.  Demonstration, verbal and tactile cues throughout for technique. UBE: L2 x 6 min (3' each fwd & back) Open book standing 10x B Standing thread the needle x 10 Wall slides flexion x 10 B Seated rows 15lb x 10 low grips UT and LS stretch B x 1 min each MANUAL THERAPY: STM to UT, levator scap and thoracic paraspinal Skilled assessment and palpation for TPDN performed by April Nonato, PT, DPT Trigger Point Dry Needling  Subsequent Treatment: Instructions provided previously at initial dry needling treatment.   Patient Verbal Consent Given: Yes Education Handout Provided: Previously Provided Muscles Treated: Bilat UT, bilat thoracic paraspinal Electrical Stimulation Performed: No Treatment  Response/Outcome: Twitch response, decreased muscle tension  Moist heat to R UT, LS x 15 min post session  03/27/2024  THERAPEUTIC EXERCISE: To improve strength, endurance, and ROM.  Demonstration, verbal and tactile cues throughout for technique. UBE: L2 x 6 min (3' each fwd & back) L LS stretch 3x30" Open book standing 10x B  MANUAL THERAPY: To promote normalized muscle tension, improved flexibility, and reduced pain. STM to UT, LS, anterior deltoids, biceps 03/12/2024  THERAPEUTIC EXERCISE: To improve strength, endurance, and ROM.  Demonstration, verbal and tactile cues throughout for technique. UBE: L1.5 x 6 min (3' each fwd & back) Cat cow in quadruped x 10  Thread the needle bil x 10  Open books bil x 10  Chin tucks supine 10x3"- pt reported increased radicular p! Supine chin tucks + cervical rotation 10x3"  MANUAL THERAPY: To promote normalized muscle tension, improved flexibility, and reduced pain. Trigger Point Dry Needling: (All TPDN performed by Felecia Hopper, PT) Treatment instructions/education: Subsequent Treatment: Instructions provided previously at initial dry needling treatment.  Education Handout Provided: Previously Provided Consent: Patient Verbal Consent Given: Yes Treatment: Muscles Treated: R UT x 2 and LS Skilled palpation and monitoring of soft tissue during DN Electrical Stimulation Performed: No Treatment Response/Outcome: Twitch response elicited, Palpable increase in muscle length, Decreased tissue resistance noted, and Decreased TTP STM/DTM, manual TPR and pin & stretch to muscles addressed with DN   03/07/2024  THERAPEUTIC EXERCISE: To improve strength, endurance, and  ROM.  Demonstration, verbal and tactile cues throughout for technique. UBE: L1.5 x 6 min (3' each fwd & back)  MANUAL THERAPY: To promote normalized muscle tension, improved flexibility, improved joint mobility, reduced pain, and reduce radicular symptoms . Trigger Point Dry  Needling: Treatment instructions/education: Initial Treatment: Pt instructed on Dry Needling rational, procedures, and possible side effects. Pt instructed to expect mild to moderate muscle soreness later in the day and/or into the next day.  Pt instructed in methods to reduce muscle soreness. Pt instructed to continue prescribed HEP. Because Dry Needling was performed over or adjacent to a lung field, pt was educated on S/S of pneumothorax and to seek immediate medical attention should they occur.  Patient was educated on signs and symptoms of infection and other risk factors and advised to seek medical attention should they occur.  Patient verbalized understanding of these instructions and education.  Education Handout Provided: Previously Provided Consent: Patient Verbal Consent Given: Yes Treatment: Muscles Treated: B UT, LS, cervical multifidi Skilled palpation and monitoring of soft tissue during DN Electrical Stimulation Performed: No Treatment Response/Outcome: Twitch response elicited, Palpable increase in muscle length, Decreased tissue resistance noted, Decreased TTP, and Improved exercise tolerance STM/DTM, manual TPR and pin & stretch to muscles addressed with DN  NEUROMUSCULAR RE-EDUCATION: To improve  UE radicular symptoms .  R brachial plexus nerve glides x 10   03/05/24 Therapeutic Exercise: to improve strength, ROM, flexibility, and endurance  Nustep L4x43min UE/LE Tricep stretch arm overhead x 30" Tricep stretch arm supported on chair in front x 30" Manual Therapy: to decrease muscle spasm, pain and improve mobility. STM and TPR to R infraspinatus, teres group, posterior deltoids, triceps Trial of cervical traction- increased tingling down arm therefore discontinued   02/27/24 Therapeutic Exercise: to improve strength, ROM, flexibility, and endurance  UBE 3 min both ways  Seated SNAG cervical extension 2 x 10 SNAG bil cervical rotation x 10 NEUROMUSCULAR  RE-EDUCATION: To improve proprioception, balance, and kinesthesia. Thoracic extension back over chair x 10  Standing rows RTB x 10 Standing shoulder extension RTB x 10  Manual Therapy: to decrease muscle spasm, pain and improve mobility.  STM to R UT and LS    PATIENT EDUCATION:  Education details: HEP update - open book, thread the needle   Person educated: Patient Education method: Explanation, Demonstration, Verbal cues, and Handouts Education comprehension: verbalized understanding, returned demonstration, verbal cues required, and needs further education  HOME EXERCISE PROGRAM: Access Code: [email or web address] URL: [email or web address] Date: 03/12/2024 Prepared by: Salma Walrond  Exercises - Supine Thoracic Mobilization Towel Roll Vertical with Arm Stretch  - 1-2 x daily - 7 x weekly - 2 sets - 10 reps - 3 sec hold - Ulnar Nerve Glide- Full Arm  - 1 x daily - 7 x weekly - 2 sets - 10 reps - 3 sec hold - Standing Ulnar Nerve Glide  - 1 x daily - 7 x weekly - 2 sets - 10 reps - 3 sec hold - Shoulder External Rotation and Scapular Retraction with Resistance  - 1 x daily - 7 x weekly - 2 sets - 10 reps - Standing Shoulder Horizontal Abduction with Resistance  - 1 x daily - 7 x weekly - 2 sets - 10 reps - Cervical Retraction with Resistance  - 1 x daily - 7 x weekly - 2 sets - 10 reps - Cervical Extension AROM with Strap  - 2 x daily - 7 x weekly - 2 sets - 10  reps - Seated Assisted Cervical Rotation with Towel  - 2 x daily - 7 x weekly - 2 sets - 10 reps - Sidelying Thoracic Rotation with Open Book  - 1 x daily - 7 x weekly - 2 sets - 10 reps - 5 sec hold - Quadruped Thoracic Rotation - Reach Under  - 1 x daily - 7 x weekly - 2 sets - 10 reps - 5 sec hold  Patient Education - Brachial plexus nerve glide   ASSESSMENT:  CLINICAL IMPRESSION: Continued with cervical and thoracic mobility exercises today with gentle postural strengthening. Pt noting increased "irritation" from  seated rows today but we were able to calm this down with the stretching. Moist heat post session to increase tissue extensibility and decrease pain. Decreased pain and muscle tension noted post visit.  Florestela will benefit from continued skilled PT to address ongoing deficits to improve mobility and activity tolerance with decreased pain interference.   OBJECTIVE IMPAIRMENTS: decreased activity tolerance, decreased knowledge of condition, decreased ROM, decreased strength, hypomobility, increased fascial restrictions, impaired perceived functional ability, increased muscle spasms, impaired flexibility, impaired sensation, impaired UE functional use, improper body mechanics, postural dysfunction, and pain.   ACTIVITY LIMITATIONS: carrying, lifting, sleeping, bathing, dressing, reach over head, and hygiene/grooming  PARTICIPATION LIMITATIONS: meal prep, cleaning, laundry, driving, shopping, community activity, and occupation  PERSONAL FACTORS: Past/current experiences, Time since onset of injury/illness/exacerbation, and 3+ comorbidities: Hypothyroidism due to Hashimoto's thyroiditis, myofascial pain syndrome, breast reduction surgery, asthma  are also affecting patient's functional outcome.   REHAB POTENTIAL: Good  CLINICAL DECISION MAKING: Evolving/moderate complexity  EVALUATION COMPLEXITY: Moderate   GOALS: Goals reviewed with patient? Yes  SHORT TERM GOALS: Target date: 03/12/2024  Patient will be independent with initial HEP to improve outcomes and carryover.  Baseline:  Goal status: MET - 02/27/24  2.  Patient will report 25% improvement in neck pain and R UE radiculopathy to improve QOL.  Baseline: 5/10 on eval, up to 7-8/10 Goal status: MET - 03/12/24  LONG TERM GOALS: Target date: 04/09/2024  Patient will be independent with ongoing/advanced HEP for self-management at home.  Baseline:  Goal status: IN PROGRESS  2.  Patient will demonstrate improved posture to decrease muscle  imbalance. Baseline:  Goal status: IN PROGRESS  3.  Patient will report 50-75% improvement in neck pain and RUE radiculopathy to improve QOL.  Baseline: 5/10 on eval, up to 7-8/10 Goal status: IN PROGRESS- 03/27/24   4.  Patient will demonstrate full pain free cervical ROM for safety with driving.   Baseline: Refer to above cervical ROM table Goal status: IN PROGRESS   5.  Patient will demonstrate full pain free R shoulder ROM for increased independence with grooming and self-care.  Baseline: Refer to above UE ROM table Goal status: IN PROGRESS- 03/27/24  6. Patient will demonstrate improved R shoulder and scapular strength to >/= 4+/5 with R grip strength symmetrical to or greater than L for functional UE use. Baseline: Refer to above UE MMT table Goal status: IN PROGRESS  7.  Patient will be able to resume normal sleeping position without sleep disturbance due to pain or R UE radiculopathy. Baseline: Currently having to sleep upright with R UE supported on pillow Goal status: IN PROGRESS   8.  Patient will report </= 15% on NDI to demonstrate improved functional ability.  Baseline: 15 / 50 = 30.0 % Goal status: IN PROGRESS  9.  Patient will </= 43% on QuickDASH to demonstrate improved functional ability  Baseline: 56.8 / 100 = 56.8 % Goal status: IN PROGRESS    PLAN:  PT FREQUENCY: 2x/week  PT DURATION: 8 weeks  PLANNED INTERVENTIONS: 97164- PT Re-evaluation, 97110-Therapeutic exercises, 97530- Therapeutic activity, [id number]- Neuromuscular re-education, 97535- Self Care, 16109- Manual therapy, [id number]- Electrical stimulation (unattended), 97035- Ultrasound, 60454- Traction (mechanical), [id number]- Ionotophoresis 4mg /ml Dexamethasone , Patient/Family education, Taping, Dry Needling, Joint mobilization, Spinal mobilization, Cryotherapy, and Moist heat  PLAN FOR NEXT SESSION: progress postural strengthening and stretching; MT +/- TPDN to address abnormal muscle tension in neck and upper  shoulders; modalities including possible cervical traction and/or [place]   Samuella Crocker, PTA 04/01/2024, 5:17 PM

## 2024-04-03 ENCOUNTER — Other Ambulatory Visit (HOSPITAL_BASED_OUTPATIENT_CLINIC_OR_DEPARTMENT_OTHER): Payer: Self-pay

## 2024-04-03 ENCOUNTER — Ambulatory Visit (AMBULATORY_SURGERY_CENTER)

## 2024-04-03 VITALS — Ht 68.0 in | Wt 235.0 lb

## 2024-04-03 DIAGNOSIS — Z1211 Encounter for screening for malignant neoplasm of colon: Secondary | ICD-10-CM

## 2024-04-03 MED ORDER — NA SULFATE-K SULFATE-MG SULF 17.5-3.13-1.6 GM/177ML PO SOLN
1.0000 | Freq: Once | ORAL | 0 refills | Status: AC
  Filled 2024-04-03: qty 354, 1d supply, fill #0

## 2024-04-03 NOTE — Progress Notes (Signed)
 Patient states she has "been able to eat eggs lately" Soy products create rash, joint pain No issues known to pt with past sedation with any surgeries or procedures Patient denies ever being told they had issues or difficulty with intubation  No FH of Malignant Hyperthermia Pt is not on diet pills nor GLP-1 medications Pt is not on  home 02  Pt is not on blood thinners  Pt denies issues with constipation  No A fib or A flutter Have any cardiac testing pending--no Ambulates independently

## 2024-04-08 ENCOUNTER — Encounter: Payer: Self-pay | Admitting: Physical Therapy

## 2024-04-08 ENCOUNTER — Ambulatory Visit: Attending: Orthopedic Surgery | Admitting: Physical Therapy

## 2024-04-08 DIAGNOSIS — M6281 Muscle weakness (generalized): Secondary | ICD-10-CM | POA: Diagnosis not present

## 2024-04-08 DIAGNOSIS — M62838 Other muscle spasm: Secondary | ICD-10-CM | POA: Diagnosis not present

## 2024-04-08 DIAGNOSIS — R293 Abnormal posture: Secondary | ICD-10-CM | POA: Insufficient documentation

## 2024-04-08 DIAGNOSIS — M5412 Radiculopathy, cervical region: Secondary | ICD-10-CM | POA: Insufficient documentation

## 2024-04-08 NOTE — Therapy (Addendum)
 OUTPATIENT PHYSICAL THERAPY TREATMENT / DISCHARGE SUMMARY  Progress Note  Reporting Period 02/13/2024 to 04/08/2024   See note below for Objective Data and Assessment of Progress/Goals.     Patient Name: Tara Rosales MRN: [id number] DOB:05/16/1981, 43 y.o., female Today's Date: 04/08/2024   END OF SESSION:  PT End of Session - 04/08/24 1624     Visit Number 9    Date for PT Re-Evaluation 04/09/24    Authorization Type BCBS    PT Start Time 1623    PT Stop Time 1655    PT Time Calculation (min) 32 min    Activity Tolerance Patient tolerated treatment well    Behavior During Therapy WFL for tasks assessed/performed               Past Medical History:  Diagnosis Date   Abnormal uterine bleeding (AUB)    Allergic rhinitis    Allergy     History of cervical dysplasia    s/p  LEEP  in 2004    in gyn office   Hypothyroidism due to Hashimoto's thyroiditis 09/19/2016   pcp and endocrinologist-- carmelita longs NP   Myofascial pain syndrome 10/14/2022   Positive ANA (antinuclear antibody) 10/14/2022   work-up done by rheumologist--- dr s. dolphus   Wears glasses    Past Surgical History:  Procedure Laterality Date   BREAST REDUCTION SURGERY Bilateral 1999   CERVICAL BIOPSY  W/ LOOP ELECTRODE EXCISION  2004   DILITATION & CURRETTAGE/HYSTROSCOPY WITH NOVASURE ABLATION N/A 11/16/2022   Procedure: DILATATION & CURETTAGE/HYSTEROSCOPY WITH NOVASURE ABLATION;  Surgeon: Jeralyn Crutch, MD;  Location: [facility];  Service: Gynecology;  Laterality: N/A;   LYMPH NODE DISSECTION  2001   per pt under chin for enlargement,  benign   TONSILLECTOMY AND ADENOIDECTOMY  1992   child   WISDOM TOOTH EXTRACTION  1995   Patient Active Problem List   Diagnosis Date Noted   Abnormal uterine bleeding 11/16/2022   Hypothyroidism 09/19/2016   Vitamin D  deficiency 03/26/2015   Abnormal Pap smear of cervix 07/24/2014    Class: History of   Asthma 05/07/2011    PCP:  Frann Mabel Mt, DO   REFERRING PROVIDER: Georgina Ozell LABOR, MD   REFERRING DIAG: [phone] (ICD-10-CM) - Radiculopathy, cervical region   THERAPY DIAG:  Radiculopathy, cervical region  Abnormal posture  Muscle weakness (generalized)  Other muscle spasm  RATIONALE FOR EVALUATION AND TREATMENT: Rehabilitation  ONSET DATE: 01/02/24  NEXT MD VISIT: 04/10/24   SUBJECTIVE:  SUBJECTIVE STATEMENT: Pt reports more awareness than pain today.  IE: Pt reports she woke up 1 morning in the end of January with tingling in her R arm which became more painful as the day progressed.  Went to urgent care and was told she had a pinched nerve - given pain shot and muscle relaxants but pain continued to worsen.  Went to ED the next day and given oxy but pain still continued to worsen.  F/u with PCP, where she was given cortisone injection and sent for cervical x-rays.  Pain better since steroid injection but still had constant numbness in ulnar distribution. Given a steroid pack 2 weeks ago and now numbness more intermittent.  Notes increased fatigue with all activities using R UE.  Currently unable to sleep except sitting up with pillow under R UE.  Hand dominance: Right  PAIN: Are you having pain? No  PERTINENT HISTORY:  Hypothyroidism due to Hashimoto's thyroiditis, myofascial pain syndrome, breast reduction surgery, asthma  PRECAUTIONS: None  RED FLAGS: None  HAND DOMINANCE: Right  WEIGHT BEARING RESTRICTIONS: No  FALLS:  Has patient fallen in last 6 months? 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   OBJECTIVE: (objective measures completed at initial evaluation unless otherwise dated)  DIAGNOSTIC FINDINGS:  01/23/24 - XR cervical spine: showing disc height loss at C5/6 and C6/7.  Grade 1 spondylolisthesis at C4/5 that shifts less than 0.5 mm between flexion and extension views.  No fracture or dislocation seen.   01/05/24 - DG cervical spine: IMPRESSION: Multilevel degenerative changes of the cervical spine, worst at C5-C6 and C6-C7.  Per Dr Marcene, anterolisthesis C4 on C5, decreased disc height loss C5-C6, C6-C7, signs of facet arthropathy, vertebral body osteophytosis more pronounced C5-C6 > C6-C7.   01-12-24 - DG right shoulder: IMPRESSION: No acute osseous abnormality of the right shoulder. No significant arthritis.  01/12/2024 - DG right elbow: IMPRESSION: No acute osseous abnormality of the right elbow.   PATIENT SURVEYS:  NDI 15 / 50 = 30.0 % Quick Dash 56.8 / 100 = 56.8 %  COGNITION: Overall cognitive status: Within functional limits for tasks assessed  SENSATION: Periodic numbness and paresthesias in her ulnar forearm and hand.  POSTURE:  rounded shoulders, forward head, and mildly protracted R shoulder  PALPATION: Increased muscle tension and TTP in R>L UT, LS and medial periscapular muscles   CERVICAL ROM:   Active ROM Eval 04/08/24  Flexion 30 p! into upper back 38  Extension 53 64  Right lateral flexion 19 p! R upper shoulder 34  Left lateral flexion 20 p! R upper shoulder 32  Right rotation 58 68  Left rotation 54 71   (Blank rows = not tested)  UPPER EXTREMITY ROM:  Active ROM Right eval Left eval R 03/27/24 R 04/08/24  Shoulder flexion 112 p! 141 140 140  Shoulder extension 34 42  46  Shoulder abduction 156 162 141 158  Shoulder adduction      Shoulder internal  rotation FIR WFL FIR [facility] WFL WFL  Shoulder external rotation FER - unable FER WFL To top of scapula T3  Elbow flexion      Elbow  extension      Wrist flexion      Wrist extension      Wrist ulnar deviation      Wrist radial deviation      Wrist pronation      Wrist supination       (Blank rows = not tested)  UPPER EXTREMITY MMT:  MMT Right eval Left eval R 04/08/24 L 04/08/24  Shoulder flexion 4 5 5 5   Shoulder extension 4 4+ 5 5  Shoulder abduction 4+ 5 5 5   Shoulder adduction      Shoulder internal rotation 4+ 5 5 5   Shoulder external rotation 4 4+ 4+ 5  Middle trapezius 4- 4 4+ 4+  Lower trapezius 3- 3 4 4   Elbow flexion      Elbow extension      Wrist flexion      Wrist extension      Wrist ulnar deviation      Wrist radial deviation      Wrist pronation      Wrist supination      Grip strength 31# 37.33#     (Blank rows = not tested)  CERVICAL SPECIAL TESTS:  Spurling's test: Negative and Distraction test: Negative - slight reduction in R UE numbness and tingling with manual cervical distraction   TODAY'S TREATMENT:   04/08/2024 THERAPEUTIC EXERCISE: To improve strength and endurance.  Demonstration, verbal and tactile cues throughout for technique.  UBE: L2 x 6 min (3' each fwd & back) Verbal review of HEP with green T-band provided for home progression as indicated.  Reviewed recommended frequency for ongoing HEP performance to prevent loss of gains achieved with PT.  THERAPEUTIC ACTIVITIES: To improve functional performance.  Demonstration, verbal and tactile cues throughout for technique. QuickDASH: 15.9 / 100 = 15.9 % NDI: 5 / 50 = 10.0 % Cervical and R shoulder ROM assessment MMT Goal assessment   04/01/2024  THERAPEUTIC EXERCISE: To improve strength, endurance, and ROM.  Demonstration, verbal and tactile cues throughout for technique. UBE: L2 x 6 min (3' each fwd & back) Open book standing 10x B Standing thread the needle x 10 Wall slides flexion x 10  B Seated rows 15lb x 10 low grips UT and LS stretch B x 1 min each MANUAL THERAPY: STM to UT, levator scap and thoracic paraspinal Skilled assessment and palpation for TPDN performed by April Nonato, PT, DPT Trigger Point Dry Needling  Subsequent Treatment: Instructions provided previously at initial dry needling treatment.   Patient Verbal Consent Given: Yes Education Handout Provided: Previously Provided Muscles Treated: Bilat UT, bilat thoracic paraspinal Electrical Stimulation Performed: No Treatment Response/Outcome: Twitch response, decreased muscle tension  Moist heat to R UT, LS x 15 min post session   03/27/2024  THERAPEUTIC EXERCISE: To improve strength, endurance, and ROM.  Demonstration, verbal and tactile cues throughout for technique. UBE: L2 x 6 min (3' each fwd & back) L LS stretch 3x30 Open book standing 10x B  MANUAL THERAPY: To promote normalized muscle tension, improved flexibility, and reduced pain. STM to UT, LS, anterior deltoids, biceps   PATIENT EDUCATION:  Education details: HEP review and recommended frequency for ongoing HEP at discharge to prevent loss of gains achieved with PT  Person educated: Patient Education method: Explanation Education comprehension: verbalized understanding  HOME EXERCISE PROGRAM: Access Code: [email or web address] URL: [email or web address] Date: 03/12/2024 Prepared by: Braylin Clark  Exercises - Supine Thoracic Mobilization Towel Roll Vertical with Arm Stretch  - 1-2 x daily -  7 x weekly - 2 sets - 10 reps - 3 sec hold - Ulnar Nerve Glide- Full Arm  - 1 x daily - 7 x weekly - 2 sets - 10 reps - 3 sec hold - Standing Ulnar Nerve Glide  - 1 x daily - 7 x weekly - 2 sets - 10 reps - 3 sec hold - Shoulder External Rotation and Scapular Retraction with Resistance  - 1 x daily - 7 x weekly - 2 sets - 10 reps - Standing Shoulder Horizontal Abduction with Resistance  - 1 x daily - 7 x weekly - 2 sets - 10 reps - Cervical  Retraction with Resistance  - 1 x daily - 7 x weekly - 2 sets - 10 reps - Cervical Extension AROM with Strap  - 2 x daily - 7 x weekly - 2 sets - 10 reps - Seated Assisted Cervical Rotation with Towel  - 2 x daily - 7 x weekly - 2 sets - 10 reps - Sidelying Thoracic Rotation with Open Book  - 1 x daily - 7 x weekly - 2 sets - 10 reps - 5 sec hold - Quadruped Thoracic Rotation - Reach Under  - 1 x daily - 7 x weekly - 2 sets - 10 reps - 5 sec hold  Patient Education - Brachial plexus nerve glide   ASSESSMENT:  CLINICAL IMPRESSION: Yanet reports 90-95% improvement in her pain since start of PT, noting that the ESI seem to help turn things around for her, with benefit also noted from TPDN.  She now has functional cervical ROM with only slight feeling of restriction in cervical flexion.  B shoulder ROM and strength essentially symmetrical and WFL/WNL with only mild weakness still evident in lower traps at 4/5.  She has been able to resume sleeping in the bed but still has to sleep on her back more than her side due to increased pain in side-lying.  NDI has improved from 30% disability to 10%, indicating only mild disability.  QuickDASH has improved from 56.8% to 15.9%.  All PT goals now met or partially met and Alise feels ready to transition to her HEP but would like to remain on hold for 30-days in the event that issues arise that would necessitate a return to PT.    OBJECTIVE IMPAIRMENTS: decreased activity tolerance, decreased knowledge of condition, decreased ROM, decreased strength, hypomobility, increased fascial restrictions, impaired perceived functional ability, increased muscle spasms, impaired flexibility, impaired sensation, impaired UE functional use, improper body mechanics, postural dysfunction, and pain.   ACTIVITY LIMITATIONS: carrying, lifting, sleeping, bathing, dressing, reach over head, and hygiene/grooming  PARTICIPATION LIMITATIONS: meal prep, cleaning, laundry, driving,  shopping, community activity, and occupation  PERSONAL FACTORS: Past/current experiences, Time since onset of injury/illness/exacerbation, and 3+ comorbidities: Hypothyroidism due to Hashimoto's thyroiditis, myofascial pain syndrome, breast reduction surgery, asthma are also affecting patient's functional outcome.   REHAB POTENTIAL: Good  CLINICAL DECISION MAKING: Evolving/moderate complexity  EVALUATION COMPLEXITY: Moderate   GOALS: Goals reviewed with patient? Yes  SHORT TERM GOALS: Target date: 03/12/2024  Patient will be independent with initial HEP to improve outcomes and carryover.  Baseline:  Goal status: MET - 02/27/24  2.  Patient will report 25% improvement in neck pain and R UE radiculopathy to improve QOL.  Baseline: 5/10 on eval, up to 7-8/10 Goal status: MET - 03/12/24  LONG TERM GOALS: Target date: 04/09/2024  Patient will be independent with ongoing/advanced HEP for self-management at home.  Baseline:  Goal  status: MET - 04/08/24  2.  Patient will demonstrate improved posture to decrease muscle imbalance. Baseline:  Goal status: MET - 04/08/24  3.  Patient will report 50-75% improvement in neck pain and RUE radiculopathy to improve QOL.  Baseline: 5/10 on eval, up to 7-8/10 Goal status: MET - 04/08/24 - 90-95% improved  4.  Patient will demonstrate full pain free cervical ROM for safety with driving.   Baseline: Refer to above cervical ROM table Goal status: MET - 04/08/24  5.  Patient will demonstrate full pain free R shoulder ROM for increased independence with grooming and self-care.  Baseline: Refer to above UE ROM table Goal status: MET - 04/08/24  6. Patient will demonstrate improved R shoulder and scapular strength to >/= 4+/5 with R grip strength symmetrical to or greater than L for functional UE use. Baseline: Refer to above UE MMT table Goal status: PARTIALLY MET - 04/08/24 - Met except B lower trap 4/5  7.  Patient will be able to resume normal sleeping  position without sleep disturbance due to pain or R UE radiculopathy. Baseline: Currently having to sleep upright with R UE supported on pillow Goal status: PARTIALLY MET - 04/08/24 - able to sleep on her back but only tolerates ~15 minutes on her side  8.  Patient will report </= 15% on NDI to demonstrate improved functional ability.  Baseline: 15 / 50 = 30.0 % Goal status: MET - 04/08/24 - 5 / 50 = 10.0 %  9.  Patient will </= 43% on QuickDASH to demonstrate improved functional ability   Baseline: 56.8 / 100 = 56.8 % Goal status: MET - 04/08/24 - 15.9 / 100 = 15.9 %   PLAN:  PT FREQUENCY: 2x/week  PT DURATION: 8 weeks  PLANNED INTERVENTIONS: 97164- PT Re-evaluation, 97110-Therapeutic exercises, 97530- Therapeutic activity, 97112- Neuromuscular re-education, 97535- Self Care, 02859- Manual therapy, G0283- Electrical stimulation (unattended), 97035- Ultrasound, 02987- Traction (mechanical), 97033- Ionotophoresis 4mg /ml Dexamethasone , Patient/Family education, Taping, Dry Needling, Joint mobilization, Spinal mobilization, Cryotherapy, and Moist heat  PLAN FOR NEXT SESSION: transition to HEP + 30-day hold; she would be interested in TPDN if offered on a pay per service basis   Elijah CHRISTELLA Hidden, PT 04/08/2024, 5:05 PM   PHYSICAL THERAPY DISCHARGE SUMMARY  Visits from Start of Care: 9  Current functional level related to goals / functional outcomes: Refer to above clinical impression and goal assessment for status as of last visit on 04/08/2024. Patient was placed on hold for 30 days and has not needed to return to PT, therefore will proceed with discharge from PT for this episode.     Remaining deficits: As above.   Education / Equipment: HEP   Patient agrees to discharge. Patient goals were met or partially met. Patient is being discharged due to being pleased with the current functional level.  Elijah EMERSON Hidden, PT 07/10/2024, 11:46 AM  [facility] [address]  [address] [place], [place], [address] Phone: [phone]   Fax:  [phone]

## 2024-04-09 DIAGNOSIS — F411 Generalized anxiety disorder: Secondary | ICD-10-CM | POA: Diagnosis not present

## 2024-04-10 ENCOUNTER — Ambulatory Visit (INDEPENDENT_AMBULATORY_CARE_PROVIDER_SITE_OTHER): Admitting: Orthopedic Surgery

## 2024-04-10 ENCOUNTER — Encounter: Payer: Self-pay | Admitting: Internal Medicine

## 2024-04-10 DIAGNOSIS — M5412 Radiculopathy, cervical region: Secondary | ICD-10-CM | POA: Diagnosis not present

## 2024-04-10 NOTE — Progress Notes (Signed)
 Orthopedic Spine Surgery Office Note   Assessment: Patient is a 43 y.o. female with right upper extremity pain.  It starts in her shoulder and goes along the posterior aspect of her arm into the dorsal forearm, suspect radiculopathy     Plan: -Patient has tried PT, Tylenol , ibuprofen , gabapentin , lidocaine  patch, TENS unit, muscle relaxer, intramuscular steroid injection, mderol dose pak, Percocet -With her doing well now, would not add any further treatment at this time -Since she got such significant relief with the injection, would consider repeat in the future. With her reaction, could consider prophylactic benadryl or prednisone  -Patient should return to office in 6 weeks, x-rays at next visit: none     Patient expressed understanding of the plan and all questions were answered to the patient's satisfaction.    ___________________________________________________________________________     History:   Patient is a 43 y.o. female who presents today for follow up on her cervical spine. Patient had been having pain that radiates into her right upper extremity. It would go down the posterior aspect of the arm into the dorsal forearm and into the 2 digits on the ulnar side of the hand. After our last visit, she got an injection with Dr. Daisey Dryer. She said she has gotten about 80-90% relief with that injection. This has been the most helpful and lasting relief that she has gotten with any conservative treatment tried so far.    Treatments tried: PT, Tylenol , ibuprofen , gabapentin , lidocaine  patch, TENS unit, muscle relaxer, intramuscular steroid injection, mderol dose pak, Percocet, cervical ESI     Physical Exam:   General: no acute distress, appears stated age Neurologic: alert, answering questions appropriately, following commands Respiratory: unlabored breathing on room air, symmetric chest rise Psychiatric: appropriate affect, normal cadence to speech     MSK (spine):   -Strength  exam                                                   Left                  Right Grip strength                5/5                  5/5 Interosseus                  5/5                  5/5 Wrist extension            5/5                  5/5 Wrist flexion                 5/5                  5/5 Elbow flexion                5/5                  5/5 Deltoid                          5/5  5/5  -Sensory exam                           Sensation intact to light touch in C5-T1 nerve distributions of bilateral upper extremities   Imaging: XRs of the cervical spine from 01/23/2024 were previously independently reviewed and interpreted, showing disc height loss at C5/6 and C6/7.  Grade 1 spondylolisthesis at C4/5 that shifts less than 0.5 mm between flexion and extension views.  No fracture or dislocation seen.    XRs of the cervical spine from 01/05/2024 were previously independently reviewed and interpreted, showing disc height loss at C5/6 and C6/7. Grade 1 spondylolisthesis at C4/5. No fracture or dislocation seen.    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 No other significant stenosis seen.     Patient name: Tara Rosales Patient MRN: [id number] Date of visit: 04/10/24

## 2024-04-15 ENCOUNTER — Encounter: Admitting: Pediatrics

## 2024-04-15 ENCOUNTER — Encounter (HOSPITAL_COMMUNITY): Payer: Self-pay

## 2024-04-16 ENCOUNTER — Telehealth: Payer: Self-pay

## 2024-04-16 NOTE — Telephone Encounter (Signed)
 Prep instructions were returned.  Called patient to get new address.  No answer left message for her to call back and get instructions on how to access instructions in MyChart or come by the office to pick up paperwork.  The patients colonoscopy is scheduled for 04/22/24.  Left phone number to appointment desk for her to call us  back and let us  know if she will come pick the instructions up from 2nd floor front desk.

## 2024-04-17 ENCOUNTER — Other Ambulatory Visit: Payer: Self-pay | Admitting: Family Medicine

## 2024-04-17 ENCOUNTER — Other Ambulatory Visit (HOSPITAL_BASED_OUTPATIENT_CLINIC_OR_DEPARTMENT_OTHER): Payer: Self-pay

## 2024-04-17 MED ORDER — LIOTHYRONINE SODIUM 5 MCG PO TABS
5.0000 ug | ORAL_TABLET | Freq: Two times a day (BID) | ORAL | 2 refills | Status: AC
  Filled 2024-04-17: qty 180, 90d supply, fill #0
  Filled 2024-07-18: qty 180, 90d supply, fill #1
  Filled 2024-10-08: qty 180, 90d supply, fill #2

## 2024-04-17 NOTE — Telephone Encounter (Signed)
 RN attempted to call patient again to make sure she successfully received her prep instructions in her My Chart, as the letter mailed to her came back to our office. RN left vm asking patient to call us  if she still needs those instructions; RN stated that if we do not hear back from her, we will assume she accessed the instructions through My Chart.

## 2024-04-18 ENCOUNTER — Other Ambulatory Visit (HOSPITAL_BASED_OUTPATIENT_CLINIC_OR_DEPARTMENT_OTHER): Payer: Self-pay

## 2024-04-22 ENCOUNTER — Encounter: Payer: Self-pay | Admitting: Internal Medicine

## 2024-04-22 ENCOUNTER — Ambulatory Visit: Admitting: Internal Medicine

## 2024-04-22 VITALS — BP 119/68 | HR 71 | Temp 97.9°F | Resp 19 | Ht 68.0 in | Wt 235.0 lb

## 2024-04-22 DIAGNOSIS — Z1211 Encounter for screening for malignant neoplasm of colon: Secondary | ICD-10-CM | POA: Diagnosis not present

## 2024-04-22 DIAGNOSIS — K5730 Diverticulosis of large intestine without perforation or abscess without bleeding: Secondary | ICD-10-CM | POA: Diagnosis not present

## 2024-04-22 DIAGNOSIS — D122 Benign neoplasm of ascending colon: Secondary | ICD-10-CM

## 2024-04-22 DIAGNOSIS — K635 Polyp of colon: Secondary | ICD-10-CM

## 2024-04-22 MED ORDER — SODIUM CHLORIDE 0.9 % IV SOLN: 500.0000 mL | INTRAVENOUS | Status: DC

## 2024-04-22 NOTE — Progress Notes (Signed)
 Called to room to assist during endoscopic procedure.  Patient ID and intended procedure confirmed with present staff. Received instructions for my participation in the procedure from the performing physician.

## 2024-04-22 NOTE — Op Note (Signed)
 [facility] Patient Name: Tara Rosales Procedure Date: 04/22/2024 8:32 AM MRN: [id number] Endoscopist: Nannette Babe , MD, [id number] Age: 43 Referring MD:  Date of Birth: [birth year] Gender: Female Account #: [id number] Procedure:                Colonoscopy Indications:              Screening for colorectal malignant neoplasm, This                            is the patient's first colonoscopy Medicines:                Monitored Anesthesia Care Procedure:                Pre-Anesthesia Assessment:                           - Prior to the procedure, a History and Physical                            was performed, and patient medications and                            allergies were reviewed. The patient's tolerance of                            previous anesthesia was also reviewed. The risks                            and benefits of the procedure and the sedation                            options and risks were discussed with the patient.                            All questions were answered, and informed consent                            was obtained. Prior Anticoagulants: The patient has                            taken no anticoagulant or antiplatelet agents. ASA                            Grade Assessment: II - A patient with mild systemic                            disease. After reviewing the risks and benefits,                            the patient was deemed in satisfactory condition to                            undergo the procedure.  After obtaining informed consent, the colonoscope                            was passed under direct vision. Throughout the                            procedure, the patient's blood pressure, pulse, and                            oxygen saturations were monitored continuously. The                            CF [id number] #[id number] was introduced through the anus                            and advanced to the  terminal ileum. The colonoscopy                            was performed without difficulty. The patient                            tolerated the procedure well. The quality of the                            bowel preparation was excellent. The terminal                            ileum, ileocecal valve, appendiceal orifice, and                            rectum were photographed. Scope In: 8:46:25 AM Scope Out: 9:01:57 AM Scope Withdrawal Time: 0 hours 12 minutes 14 seconds  Total Procedure Duration: 0 hours 15 minutes 32 seconds  Findings:                 The digital rectal exam was normal.                           The terminal ileum appeared normal.                           Two mucous-capped polyps were found in the                            ascending colon. The polyps were 6 to 8 mm in size.                            These polyps were removed with a cold snare.                            Resection and retrieval were complete.                           Multiple medium-mouthed and small-mouthed  diverticula were found in the sigmoid colon.                           The retroflexed view of the distal rectum and anal                            verge was normal and showed no anal or rectal                            abnormalities. Complications:            No immediate complications. Estimated Blood Loss:     Estimated blood loss was minimal. Impression:               - The examined portion of the ileum was normal.                           - Two 6 to 8 mm polyps in the ascending colon,                            removed with a cold snare. Resected and retrieved.                           - Mild diverticulosis in the sigmoid colon.                           - The distal rectum and anal verge are normal on                            retroflexion view. Recommendation:           - Patient has a contact number available for                            emergencies.  The signs and symptoms of potential                            delayed complications were discussed with the                            patient. Return to normal activities tomorrow.                            Written discharge instructions were provided to the                            patient.                           - Resume previous diet.                           - Continue present medications.                           - Await pathology results.                           -  Repeat colonoscopy is recommended for                            surveillance. The colonoscopy date will be                            determined after pathology results from today's                            exam become available for review. Nannette Babe, MD 04/22/2024 9:04:36 AM This report has been signed electronically.

## 2024-04-22 NOTE — Patient Instructions (Signed)
Handouts given on diverticulosis and polyps  YOU HAD AN ENDOSCOPIC PROCEDURE TODAY AT [facility]:   Refer to the procedure report that was given to you for any specific questions about what was found during the examination.  If the procedure report does not answer your questions, please call your gastroenterologist to clarify.  If you requested that your care partner not be given the details of your procedure findings, then the procedure report has been included in a sealed envelope for you to review at your convenience later.  YOU SHOULD EXPECT: Some feelings of bloating in the abdomen. Passage of more gas than usual.  Walking can help get rid of the air that was put into your GI tract during the procedure and reduce the bloating. If you had a lower endoscopy (such as a colonoscopy or flexible sigmoidoscopy) you may notice spotting of blood in your stool or on the toilet paper. If you underwent a bowel prep for your procedure, you may not have a normal bowel movement for a few days.  Please Note:  You might notice some irritation and congestion in your nose or some drainage.  This is from the oxygen used during your procedure.  There is no need for concern and it should clear up in a day or so.  SYMPTOMS TO REPORT IMMEDIATELY:  Following lower endoscopy (colonoscopy or flexible sigmoidoscopy):  Excessive amounts of blood in the stool  Significant tenderness or worsening of abdominal pains  Swelling of the abdomen that is new, acute  Fever of 100F or higher   For urgent or emergent issues, a gastroenterologist can be reached at any hour by calling [phone]. Do not use MyChart messaging for urgent concerns.    DIET:  We do recommend a small meal at first, but then you may proceed to your regular diet.  Drink plenty of fluids but you should avoid alcoholic beverages for 24 hours.  ACTIVITY:  You should plan to take it easy for the rest of today and you should NOT DRIVE  or use heavy machinery until tomorrow (because of the sedation medicines used during the test).    FOLLOW UP: Our staff will call the number listed on your records the next business day following your procedure.  We will call around 7:15- 8:00 am to check on you and address any questions or concerns that you may have regarding the information given to you following your procedure. If we do not reach you, we will leave a message.     If any biopsies were taken you will be contacted by phone or by letter within the next 1-3 weeks.  Please call us at [phone] if you have not heard about the biopsies in 3 weeks.    SIGNATURES/CONFIDENTIALITY: You and/or your care partner have signed paperwork which will be entered into your electronic medical record.  These signatures attest to the fact that that the information above on your After Visit Summary has been reviewed and is understood.  Full responsibility of the confidentiality of this discharge information lies with you and/or your care-partner.

## 2024-04-22 NOTE — Progress Notes (Signed)
 GASTROENTEROLOGY PROCEDURE H&P NOTE   Primary Care Physician: Jobe Mulder, DO    Reason for Procedure:  Colon cancer screening  Plan:    Colonoscopy  Patient is appropriate for endoscopic procedure(s) in the ambulatory ([facility]) setting.  The nature of the procedure, as well as the risks, benefits, and alternatives were carefully and thoroughly reviewed with the patient. Ample time for discussion and questions allowed. The patient understood, was satisfied, and agreed to proceed.     HPI: Tara Rosales is a 43 y.o. female who presents for screening colonoscopy.  Medical history as below.  Tolerated the prep.  No recent chest pain or shortness of breath.  No abdominal pain today.  Past Medical History:  Diagnosis Date   Abnormal uterine bleeding (AUB)    Allergic rhinitis    Allergy     History of cervical dysplasia    s/p  LEEP  in 2004    in gyn office   Hypothyroidism due to Hashimoto's thyroiditis 09/19/2016   pcp and endocrinologist-- Karis Outhouse NP   Myofascial pain syndrome 10/14/2022   Positive ANA (antinuclear antibody) 10/14/2022   work-up done by rheumologist--- dr s. Alvira Josephs   Wears glasses     Past Surgical History:  Procedure Laterality Date   BREAST REDUCTION SURGERY Bilateral 1999   CERVICAL BIOPSY  W/ LOOP ELECTRODE EXCISION  2004   DILITATION & CURRETTAGE/HYSTROSCOPY WITH NOVASURE ABLATION N/A 11/16/2022   Procedure: DILATATION & CURETTAGE/HYSTEROSCOPY WITH NOVASURE ABLATION;  Surgeon: Kiki Pelton, MD;  Location: [facility];  Service: Gynecology;  Laterality: N/A;   LYMPH NODE DISSECTION  2001   per pt under chin for enlargement,  benign   TONSILLECTOMY AND ADENOIDECTOMY  1992   child   WISDOM TOOTH EXTRACTION  1995    Prior to Admission medications   Medication Sig Start Date End Date Taking? Authorizing Provider  calcium carbonate (OSCAL) 1500 (600 Ca) MG TABS tablet Take 300 mg by mouth 2 (two) times daily  with a meal.   Yes [provider]  cetirizine (ZYRTEC) 5 MG chewable tablet Chew 5 mg by mouth daily.   Yes [provider]  cholecalciferol (VITAMIN D3) 25 MCG (1000 UNIT) tablet Take 6,000 Units by mouth daily.   Yes [provider]  levothyroxine  (SYNTHROID ) 75 MCG tablet Take 1 tablet (75 mcg total) by mouth daily. 11/27/23  Yes Jobe Mulder, DO  liothyronine  (CYTOMEL ) 5 MCG tablet Take 1 tablet (5 mcg total) by mouth 2 (two) times daily. 04/17/24  Yes Jobe Mulder, DO  Multiple Vitamin (MULTI-VITAMIN) tablet Take 1 tablet by mouth. 11/06/17  Yes [provider]  EPINEPHrine  (EPIPEN  2-PAK) 0.3 mg/0.3 mL IJ SOAJ injection Inject 0.3 mg into the muscle as needed for anaphylaxis. 10/16/23   Jobe Mulder, DO  pregabalin  (LYRICA ) 75 MG capsule Take 1 capsule (75 mg total) by mouth 2 (two) times daily. 02/07/24 04/10/24  Diedra Fowler, MD    Current Outpatient Medications  Medication Sig Dispense Refill   calcium carbonate (OSCAL) 1500 (600 Ca) MG TABS tablet Take 300 mg by mouth 2 (two) times daily with a meal.     cetirizine (ZYRTEC) 5 MG chewable tablet Chew 5 mg by mouth daily.     cholecalciferol (VITAMIN D3) 25 MCG (1000 UNIT) tablet Take 6,000 Units by mouth daily.     levothyroxine  (SYNTHROID ) 75 MCG tablet Take 1 tablet (75 mcg total) by mouth daily. 90 tablet 2   liothyronine  (CYTOMEL )  5 MCG tablet Take 1 tablet (5 mcg total) by mouth 2 (two) times daily. 180 tablet 2   Multiple Vitamin (MULTI-VITAMIN) tablet Take 1 tablet by mouth.     EPINEPHrine  (EPIPEN  2-PAK) 0.3 mg/0.3 mL IJ SOAJ injection Inject 0.3 mg into the muscle as needed for anaphylaxis. 2 each 2   pregabalin  (LYRICA ) 75 MG capsule Take 1 capsule (75 mg total) by mouth 2 (two) times daily. 60 capsule 1   Current Facility-Administered Medications  Medication Dose Route Frequency Provider Last Rate Last Admin   0.9 %  sodium chloride  infusion  500 mL  Intravenous Continuous Ludger Bones, Amber Bail, MD        Allergies as of 04/22/2024 - Review Complete 04/22/2024  Allergen Reaction Noted   Penicillins Hives 05/07/2011   Sulfa antibiotics Hives 01/11/2021   Egg-derived products Diarrhea, Nausea And Vomiting, and Rash 11/15/2022   Gluten meal Diarrhea, Nausea And Vomiting, and Rash 11/15/2022   Other Diarrhea, Nausea And Vomiting, and Rash 11/15/2022   Peanut-containing drug products Diarrhea, Nausea And Vomiting, and Rash 11/15/2022   Soy allergy  (obsolete) Diarrhea, Nausea And Vomiting, and Rash 11/15/2022    Family History  Problem Relation Age of Onset   Colon polyps Mother    Hypertension Mother    Colon polyps Father    Hypertension Father    Cancer Father        prostate   Stroke Father    Rectal cancer Maternal Grandmother    Cancer Maternal Grandmother        rectal   Diabetes Maternal Grandfather    Breast cancer Paternal Grandmother 79   Heart attack Paternal Grandfather    Colon cancer Neg Hx    Esophageal cancer Neg Hx    Stomach cancer Neg Hx     Social History   Socioeconomic History   Marital status: Single    Spouse name: Not on file   Number of children: Not on file   Years of education: Not on file   Highest education level: Associate degree: occupational, Scientist, product/process development, or vocational program  Occupational History   Not on file  Tobacco Use   Smoking status: Never    Passive exposure: Never   Smokeless tobacco: Never  Vaping Use   Vaping status: Never Used  Substance and Sexual Activity   Alcohol use: Not Currently   Drug use: Never   Sexual activity: Yes    Partners: Male  Other Topics Concern   Not on file  Social History Narrative   Not on file   Social Drivers of Health   Financial Resource Strain: Low Risk  (01/04/2024)   Overall Financial Resource Strain (CARDIA)    Difficulty of Paying Living Expenses: Not very hard  Food Insecurity: No Food Insecurity (01/04/2024)   Hunger Vital Sign     Worried About Running Out of Food in the Last Year: Never true    Ran Out of Food in the Last Year: Never true  Transportation Needs: No Transportation Needs (01/04/2024)   PRAPARE - Administrator, Civil Service (Medical): No    Lack of Transportation (Non-Medical): No  Physical Activity: Unknown (01/04/2024)   Exercise Vital Sign    Days of Exercise per Week: 0 days    Minutes of Exercise per Session: Not on file  Stress: No Stress Concern Present (01/04/2024)   [employer] of Occupational Health - Occupational Stress Questionnaire    Feeling of Stress : Only a little  Recent  Concern: Stress - Stress Concern Present (10/16/2023)   [employer] of Occupational Health - Occupational Stress Questionnaire    Feeling of Stress : To some extent  Social Connections: Socially Isolated (01/04/2024)   Social Connection and Isolation Panel [NHANES]    Frequency of Communication with Friends and Family: More than three times a week    Frequency of Social Gatherings with Friends and Family: Once a week    Attends Religious Services: Never    Database administrator or Organizations: No    Attends Engineer, structural: Not on file    Marital Status: Never married  Intimate Partner Violence: Unknown (03/11/2022)   Received from [employer], [facility]   HITS    Physically Hurt: Not on file    Insult or Talk Down To: Not on file    Threaten Physical Harm: Not on file    Scream or Curse: Not on file    Physical Exam: Vital signs in last 24 hours: @BP  122/83   Pulse 76   Temp 97.9 F (36.6 C)   Ht 5\' 8"  (1.727 m)   Wt 235 lb (106.6 kg)   SpO2 100%   BMI 35.73 kg/m  GEN: NAD EYE: Sclerae anicteric ENT: MMM CV: Non-tachycardic Pulm: CTA b/l GI: Soft, NT/ND NEURO:  Alert & Oriented x 3   Laurell Pond, MD [facility]  04/22/2024 8:22 AM

## 2024-04-22 NOTE — Progress Notes (Signed)
 Pt's states no medical or surgical changes since previsit or office visit.

## 2024-04-22 NOTE — Progress Notes (Signed)
 Pt resting comfortably. VSS. Airway intact. SBAR complete to RN. All questions answered.

## 2024-04-23 ENCOUNTER — Telehealth: Payer: Self-pay

## 2024-04-23 DIAGNOSIS — F411 Generalized anxiety disorder: Secondary | ICD-10-CM | POA: Diagnosis not present

## 2024-04-23 NOTE — Telephone Encounter (Signed)
  Follow up Call-     04/22/2024    7:39 AM  Call back number  Post procedure Call Back phone  # [phone]  Permission to leave phone message Yes     Patient questions:  Do you have a fever, pain , or abdominal swelling? No. Pain Score  0 *  Have you tolerated food without any problems? Yes.    Have you been able to return to your normal activities? Yes.    Do you have any questions about your discharge instructions: Diet   No. Medications  No. Follow up visit  No.  Do you have questions or concerns about your Care? Yes.    Patient reports continued nausea after yesterday's procedure. She was able to tolerate a small snack yesterday and ate breakfast this morning. She will contact Dr. Marietta Shorter office if her nausea does not resolve after today.  Actions: * If pain score is 4 or above: No action needed, pain <4.

## 2024-04-23 NOTE — Telephone Encounter (Signed)
 Okay, if nausea persist it is okay to give Zofran  ODT 4 mg every 8 hours as needed nausea #30 no refills

## 2024-04-24 LAB — SURGICAL PATHOLOGY

## 2024-04-25 ENCOUNTER — Ambulatory Visit: Payer: Self-pay | Admitting: Internal Medicine

## 2024-05-06 ENCOUNTER — Encounter: Admitting: Physical Therapy

## 2024-05-07 DIAGNOSIS — F411 Generalized anxiety disorder: Secondary | ICD-10-CM | POA: Diagnosis not present

## 2024-05-09 ENCOUNTER — Encounter: Admitting: Physical Therapy

## 2024-05-15 ENCOUNTER — Ambulatory Visit: Admitting: Obstetrics & Gynecology

## 2024-05-20 ENCOUNTER — Other Ambulatory Visit (HOSPITAL_BASED_OUTPATIENT_CLINIC_OR_DEPARTMENT_OTHER): Payer: Self-pay

## 2024-05-20 ENCOUNTER — Encounter: Payer: Self-pay | Admitting: Family Medicine

## 2024-05-20 ENCOUNTER — Ambulatory Visit (INDEPENDENT_AMBULATORY_CARE_PROVIDER_SITE_OTHER): Admitting: Family Medicine

## 2024-05-20 VITALS — BP 126/78 | HR 82 | Temp 98.0°F | Resp 16 | Ht 68.0 in | Wt 243.4 lb

## 2024-05-20 DIAGNOSIS — H6991 Unspecified Eustachian tube disorder, right ear: Secondary | ICD-10-CM | POA: Diagnosis not present

## 2024-05-20 MED ORDER — MOMETASONE FUROATE 50 MCG/ACT NA SUSP
2.0000 | Freq: Every day | NASAL | 2 refills | Status: DC
  Filled 2024-05-20: qty 17, 30d supply, fill #0

## 2024-05-20 MED ORDER — PREDNISONE 20 MG PO TABS
40.0000 mg | ORAL_TABLET | Freq: Every day | ORAL | 0 refills | Status: AC
  Filled 2024-05-20: qty 10, 5d supply, fill #0

## 2024-05-20 NOTE — Patient Instructions (Addendum)
 Consider using Flonase  (Rhinocort and Nasonex are alternatives) daily as needed.   Send me a message in 2-3 days if not significantly better.   Let us  know if you need anything.

## 2024-05-20 NOTE — Progress Notes (Signed)
 Chief Complaint  Patient presents with   Sinus Problem    Sinus Problems     Rotunda Worden here for URI complaints.  Duration: 4 days  Associated symptoms: sinus congestion, ear fullness, sore throat, and wheezing Denies: sinus pain, rhinorrhea, itchy watery eyes, ear pain, ear drainage, shortness of breath, myalgia, and fevers, coughing Treatment to date: Zyrtec, Benadryl Sick contacts: No  Past Medical History:  Diagnosis Date   Abnormal uterine bleeding (AUB)    Allergic rhinitis    Allergy     History of cervical dysplasia    s/p  LEEP  in 2004    in gyn office   Hypothyroidism due to Hashimoto's thyroiditis 09/19/2016   pcp and endocrinologist-- Karis Outhouse NP   Myofascial pain syndrome 10/14/2022   Positive ANA (antinuclear antibody) 10/14/2022   work-up done by rheumologist--- dr s. Alvira Josephs   Wears glasses     Objective BP 126/78 (BP Location: Left Arm, Patient Position: Sitting)   Pulse 82   Temp 98 F (36.7 C) (Oral)   Resp 16   Ht 5' 8 (1.727 m)   Wt 243 lb 6.4 oz (110.4 kg)   SpO2 98%   BMI 37.01 kg/m  General: Awake, alert, appears stated age HEENT: AT, [place], ears patent b/l and TM's neg, nares patent w/o discharge, pharynx pink and without exudates, MMM, mild TTP to the maxillary sinuses bilaterally Neck: No masses or asymmetry Heart: RRR Lungs: CTAB, no accessory muscle use Psych: Age appropriate judgment and insight, normal mood and affect  Dysfunction of right eustachian tube - Plan: predniSONE  (DELTASONE ) 20 MG tablet  Suspect allergies inducing eustachian tube dysfunction.  5-day prednisone  burst 40 mg daily.  Go back to an INCS daily as needed.  Continue to push fluids, practice good hand hygiene, cover mouth when coughing.  Send message in a few days if not significantly better. F/u prn. If starting to experience fevers, shaking, or shortness of breath, seek immediate care. Pt voiced understanding and agreement to the plan.  Shellie Dials  [place], DO 05/20/24 11:59 AM

## 2024-05-21 DIAGNOSIS — F411 Generalized anxiety disorder: Secondary | ICD-10-CM | POA: Diagnosis not present

## 2024-05-22 ENCOUNTER — Ambulatory Visit: Admitting: Orthopedic Surgery

## 2024-05-22 ENCOUNTER — Ambulatory Visit: Admitting: Family Medicine

## 2024-05-22 ENCOUNTER — Ambulatory Visit: Admitting: Obstetrics & Gynecology

## 2024-05-22 ENCOUNTER — Other Ambulatory Visit (HOSPITAL_COMMUNITY)
Admission: RE | Admit: 2024-05-22 | Discharge: 2024-05-22 | Disposition: A | Discharge status: Home / Self Care | Source: Ambulatory Visit | Attending: Obstetrics & Gynecology | Admitting: Obstetrics & Gynecology

## 2024-05-22 VITALS — BP 116/77 | HR 97 | Ht 68.0 in | Wt 248.0 lb

## 2024-05-22 DIAGNOSIS — Z01419 Encounter for gynecological examination (general) (routine) without abnormal findings: Secondary | ICD-10-CM

## 2024-05-22 DIAGNOSIS — Z1331 Encounter for screening for depression: Secondary | ICD-10-CM

## 2024-05-22 NOTE — Progress Notes (Signed)
 Subjective:     Tara Rosales is a 43 y.o. female G0. Here for a routine exam.  Current complaints: none. Here for PAP. S/p ablation. Doing well. Fellow book lover.      Gynecologic History No LMP recorded. Patient has had an ablation. Contraception: abstinence Last Pap: 09/2017. Results were: normal Last mammogram: 02/22/2024. Results were: normal  Obstetric History OB History  Gravida Para Term Preterm AB Living  0 0 0 0 0 0  SAB IAB Ectopic Multiple Live Births  0 0 0 0      The following portions of the patient's history were reviewed and updated as appropriate: allergies, current medications, past family history, past medical history, past social history, past surgical history, and problem list.  Review of Systems Pertinent items are noted in HPI.    Objective:  Ht 5' 8 (1.727 m)   Wt 248 lb (112.5 kg)   BMI 37.71 kg/m  General Appearance:    Alert, cooperative, no distress, appears stated age  Head:    Normocephalic, without obvious abnormality, atraumatic  Eyes:    conjunctiva/corneas clear, EOM's intact, both eyes  Ears:    Normal external ear canals, both ears  Nose:   Nares normal, septum midline, mucosa normal, no drainage    or sinus tenderness  Throat:   Lips, mucosa, and tongue normal; teeth and gums normal  Neck:   Supple, symmetrical, trachea midline, no adenopathy;    thyroid :  no enlargement/tenderness/nodules  Back:     Symmetric, no curvature, ROM normal, no CVA tenderness  Lungs:     respirations unlabored  Chest Wall:    No tenderness or deformity   Heart:    Regular rate and rhythm  Breast Exam:    No tenderness, masses, or nipple abnormality; well healed incision from redxn mammoplasty.    Abdomen:     Soft, non-tender, bowel sounds active all four quadrants,    no masses, no organomegaly  Genitalia:    Normal female without lesion, discharge or tenderness     Extremities:   Extremities normal, atraumatic, no cyanosis or edema  Pulses:   2+  and symmetric all extremities  Skin:   Skin color, texture, turgor normal, no rashes or lesions     Assessment:    Healthy female exam.    Plan:   Tara Rosales was seen today for gynecologic exam.  Diagnoses and all orders for this visit:  Well female exam with routine gynecological exam -     Cytology - PAP   F/u in 1 year or sooner prn   Tara Rosales, M.D., FACOG

## 2024-05-27 LAB — CYTOLOGY - PAP
Adequacy: ABSENT
Chlamydia: NEGATIVE
Comment: NEGATIVE
Comment: NEGATIVE
Comment: NORMAL
Diagnosis: NEGATIVE
High risk HPV: NEGATIVE
Neisseria Gonorrhea: NEGATIVE

## 2024-06-04 DIAGNOSIS — F411 Generalized anxiety disorder: Secondary | ICD-10-CM | POA: Diagnosis not present

## 2024-06-09 ENCOUNTER — Ambulatory Visit: Payer: Self-pay | Admitting: Obstetrics & Gynecology

## 2024-06-14 ENCOUNTER — Ambulatory Visit (INDEPENDENT_AMBULATORY_CARE_PROVIDER_SITE_OTHER): Admitting: Family Medicine

## 2024-06-14 ENCOUNTER — Other Ambulatory Visit (HOSPITAL_BASED_OUTPATIENT_CLINIC_OR_DEPARTMENT_OTHER): Payer: Self-pay

## 2024-06-14 VITALS — BP 122/78 | HR 100 | Temp 98.0°F | Resp 16 | Ht 69.6 in | Wt 248.0 lb

## 2024-06-14 DIAGNOSIS — M722 Plantar fascial fibromatosis: Secondary | ICD-10-CM

## 2024-06-14 MED ORDER — MELOXICAM 15 MG PO TABS
15.0000 mg | ORAL_TABLET | Freq: Every day | ORAL | 0 refills | Status: DC
  Filled 2024-06-14: qty 30, 30d supply, fill #0

## 2024-06-14 NOTE — Patient Instructions (Signed)
 Ice/cold pack over area for 10-15 min twice daily.  OK to take Tylenol  1000 mg (2 extra strength tabs) or 975 mg (3 regular strength tabs) every 6 hours as needed.  Consider a Strassburg sock to wear at night.  Consider Powerstep insoles. There are very quality over the counter inserts. Shop around online and in stores. Dr. Heriberto is a cheaper alternative, though is not as high of quality.   Let us  know if you need anything.  Plantar Fasciitis Stretches/exercises Do exercises exactly as told by your health care provider and adjust them as directed. It is normal to feel mild stretching, pulling, tightness, or discomfort as you do these exercises, but you should stop right away if you feel sudden pain or your pain gets worse.   Stretching and range of motion exercises These exercises warm up your muscles and joints and improve the movement and flexibility of your foot. These exercises also help to relieve pain.  Exercise A: Plantar fascia stretch Sit with your left / right leg crossed over your opposite knee. Hold your heel with one hand with that thumb near your arch. With your other hand, hold your toes and gently pull them back toward the top of your foot. You should feel a stretch on the bottom of your toes or your foot or both. Hold this stretch for 30 seconds. Slowly release your toes and return to the starting position. Repeat 2 times. Complete this exercise 3 times per week.  Exercise B: Gastroc, standing Stand with your hands against a wall. Extend your left / right leg behind you, and bend your front knee slightly. Keeping your heels on the floor and keeping your back knee straight, shift your weight toward the wall without arching your back. You should feel a gentle stretch in your left / right calf. Hold this position for 30 seconds. Repeat 2 times. Complete this exercise 3 times a week. Exercise C: Soleus, standing Stand with your hands against a wall. Extend your left /  right leg behind you, and bend your front knee slightly. Keeping your heels on the floor, bend your back knee and slightly shift your weight over the back leg. You should feel a gentle stretch deep in your calf. Hold this position for 30 seconds. Repeat 2 times. Complete this exercise 3 times per week. Exercise D: Gastrocsoleus, standing Stand with the ball of your left / right foot on a step. The ball of your foot is on the walking surface, right under your toes. Keep your other foot firmly on the same step. Hold onto the wall or a railing for balance. Slowly lift your other foot, allowing your body weight to press your heel down over the edge of the step. You should feel a stretch in your left / right calf. Hold this position for 30 seconds. Return both feet to the step. Repeat this exercise with a slight bend in your left / right knee. Repeat 2 times with your left / right knee straight and 2times with your left / right knee bent. Complete this exercise 3 times a week.  Balance exercise This exercise builds your balance and strength control of your arch to help take pressure off your plantar fascia. Exercise E: Single leg stand Without shoes, stand near a railing or in a doorway. You may hold onto the railing or door frame as needed. Stand on your left / right foot. Keep your big toe down on the floor and try to keep your  arch lifted. Do not let your foot roll inward. Hold this position for 30 seconds. If this exercise is too easy, you can try it with your eyes closed or while standing on a pillow. Repeat 2 times. Complete this exercise 3 times per week. This information is not intended to replace advice given to you by your health care provider. Make sure you discuss any questions you have with your health care provider. Document Released: 11/21/2005 Document Revised: 07/26/2016 Document Reviewed: 10/05/2015 Elsevier Interactive Patient Education  2017 [employer].

## 2024-06-14 NOTE — Progress Notes (Signed)
 Musculoskeletal Exam  Patient: Tara Rosales DOB: 20-Aug-1981  DOS: 06/14/2024  SUBJECTIVE:  Chief Complaint:   Chief Complaint  Patient presents with   Foot Pain    Foot and Ankle Pain   Ankle Pain    Tara Rosales is a 43 y.o.  female for evaluation and treatment of R ankle pain.   Onset:  4 months ago. No inj or change in activity.  Location: bottom of heel Character:  sharp  Progression of issue:  is unchanged Associated symptoms: radiating up ankle Treatment: to date has been ice, OTC NSAIDS, oral steroids, and bracing.   Neurovascular symptoms: no  Past Medical History:  Diagnosis Date   Abnormal uterine bleeding (AUB)    Allergic rhinitis    Allergy     History of cervical dysplasia    s/p  LEEP  in 2004    in gyn office   Hypothyroidism due to Hashimoto's thyroiditis 09/19/2016   pcp and endocrinologist-- carmelita longs NP   Myofascial pain syndrome 10/14/2022   Positive ANA (antinuclear antibody) 10/14/2022   work-up done by rheumologist--- dr s. dolphus   Wears glasses     Objective: VITAL SIGNS: BP 122/78 (BP Location: Left Arm, Patient Position: Sitting)   Pulse 100   Temp 98 F (36.7 C) (Oral)   Resp 16   Ht 5' 9.6 (1.768 m)   Wt 248 lb (112.5 kg)   SpO2 98%   BMI 35.99 kg/m  Constitutional: Well formed, well developed. No acute distress. Thorax & Lungs: No accessory muscle use Musculoskeletal: R foot.   Tenderness to palpation: Yes over the proximal origin of the plantar fascia; no other bony tenderness noted Deformity: no Ecchymosis: no Tests positive: None Tests negative: Squeeze, anterior drawer Neurologic: Normal sensory function. No focal deficits noted. DTR's equal and symmetric in LE's. No clonus. Psychiatric: Normal mood. Age appropriate judgment and insight. Alert & oriented x 3.    Assessment:  Plantar fasciitis - Plan: meloxicam  (MOBIC ) 15 MG tablet  Plan: Chronic, not controlled.  Meloxicam  15 mg daily for the next 7  days and then daily as needed.  Stretches/exercises, heat, ice, Tylenol .  Consider Strassburg sock and arch support. F/u as originally scheduled. The patient voiced understanding and agreement to the plan.   Tara Mt Murphy, DO 06/14/24  3:27 PM

## 2024-07-02 DIAGNOSIS — F411 Generalized anxiety disorder: Secondary | ICD-10-CM | POA: Diagnosis not present

## 2024-07-16 DIAGNOSIS — F411 Generalized anxiety disorder: Secondary | ICD-10-CM | POA: Diagnosis not present

## 2024-08-10 ENCOUNTER — Ambulatory Visit
Admission: EM | Admit: 2024-08-10 | Discharge: 2024-08-10 | Disposition: A | Discharge status: Home / Self Care | Attending: Family Medicine | Admitting: Family Medicine

## 2024-08-10 DIAGNOSIS — J0100 Acute maxillary sinusitis, unspecified: Secondary | ICD-10-CM | POA: Diagnosis not present

## 2024-08-10 DIAGNOSIS — R0981 Nasal congestion: Secondary | ICD-10-CM

## 2024-08-10 MED ORDER — DOXYCYCLINE HYCLATE 100 MG PO CAPS: 100.0000 mg | ORAL_CAPSULE | Freq: Two times a day (BID) | ORAL | 0 refills | Status: AC

## 2024-08-10 MED ORDER — PREDNISONE 20 MG PO TABS: ORAL_TABLET | ORAL | 0 refills | Status: DC

## 2024-08-10 NOTE — ED Provider Notes (Signed)
 [facility]    CSN: [id number] Arrival date & time: 08/10/24  1041      History   Chief Complaint Chief Complaint  Patient presents with   Facial Pain   Headache    HPI Zakiah Gauthreaux is a 43 y.o. female.   HPI 43 year old female presents with nasal congestion headache and sinus pressure for 1 week.  PMH significant for abnormal uterine bleeding, hypothyroidism, and vitamin D  deficiency.  Past Medical History:  Diagnosis Date   Abnormal uterine bleeding (AUB)    Allergic rhinitis    Allergy     History of cervical dysplasia    s/p  LEEP  in 2004    in gyn office   Hypothyroidism due to Hashimoto's thyroiditis 09/19/2016   pcp and endocrinologist-- carmelita longs NP   Myofascial pain syndrome 10/14/2022   Positive ANA (antinuclear antibody) 10/14/2022   work-up done by rheumologist--- dr s. dolphus   Wears glasses     Patient Active Problem List   Diagnosis Date Noted   Abnormal uterine bleeding 11/16/2022   Hypothyroidism 09/19/2016   Vitamin D  deficiency 03/26/2015   Abnormal Pap smear of cervix 07/24/2014    Class: History of   Asthma 05/07/2011    Past Surgical History:  Procedure Laterality Date   BREAST REDUCTION SURGERY Bilateral 1999   CERVICAL BIOPSY  W/ LOOP ELECTRODE EXCISION  2004   DILITATION & CURRETTAGE/HYSTROSCOPY WITH NOVASURE ABLATION N/A 11/16/2022   Procedure: DILATATION & CURETTAGE/HYSTEROSCOPY WITH NOVASURE ABLATION;  Surgeon: Jeralyn Crutch, MD;  Location: [facility];  Service: Gynecology;  Laterality: N/A;   LYMPH NODE DISSECTION  2001   per pt under chin for enlargement,  benign   TONSILLECTOMY AND ADENOIDECTOMY  1992   child   WISDOM TOOTH EXTRACTION  1995    OB History     Gravida  0   Para  0   Term  0   Preterm  0   AB  0   Living  0      SAB  0   IAB  0   Ectopic  0   Multiple  0   Live Births               Home Medications    Prior to Admission medications    Medication Sig Start Date End Date Taking? Authorizing Provider  doxycycline  (VIBRAMYCIN ) 100 MG capsule Take 1 capsule (100 mg total) by mouth 2 (two) times daily for 7 days. 08/10/24 08/17/24 Yes Teddy Sharper, FNP  predniSONE  (DELTASONE ) 20 MG tablet Take 3 tabs PO daily x 5 days. 08/10/24  Yes Teddy Sharper, FNP  calcium carbonate (OSCAL) 1500 (600 Ca) MG TABS tablet Take 300 mg by mouth 2 (two) times daily with a meal.    [provider]  cetirizine (ZYRTEC) 5 MG chewable tablet Chew 5 mg by mouth daily.    [provider]  cholecalciferol (VITAMIN D3) 25 MCG (1000 UNIT) tablet Take 6,000 Units by mouth daily.    [provider]  EPINEPHrine  (EPIPEN  2-PAK) 0.3 mg/0.3 mL IJ SOAJ injection Inject 0.3 mg into the muscle as needed for anaphylaxis. 10/16/23   Frann Mabel Mt, DO  levothyroxine  (SYNTHROID ) 75 MCG tablet Take 1 tablet (75 mcg total) by mouth daily. 11/27/23   Frann Mabel Mt, DO  liothyronine  (CYTOMEL ) 5 MCG tablet Take 1 tablet (5 mcg total) by mouth 2 (two) times daily. 04/17/24   Frann Mabel Mt, DO  meloxicam  (MOBIC ) 15  MG tablet Take 1 tablet (15 mg total) by mouth daily. 06/14/24   Frann Mabel Mt, DO  mometasone  (NASONEX ) 50 MCG/ACT nasal spray Place 2 sprays into the nose daily. 05/20/24   Frann Mabel Mt, DO  Multiple Vitamin (MULTI-VITAMIN) tablet Take 1 tablet by mouth. 11/06/17   [provider]    Family History Family History  Problem Relation Age of Onset   Colon polyps Mother    Hypertension Mother    Colon polyps Father    Hypertension Father    Cancer Father        prostate   Stroke Father    Rectal cancer Maternal Grandmother    Cancer Maternal Grandmother        rectal   Diabetes Maternal Grandfather    Breast cancer Paternal Grandmother 16   Heart attack Paternal Grandfather    Colon cancer Neg Hx    Esophageal cancer Neg Hx    Stomach cancer Neg Hx     Social History Social  History   Tobacco Use   Smoking status: Never    Passive exposure: Never   Smokeless tobacco: Never  Vaping Use   Vaping status: Never Used  Substance Use Topics   Alcohol use: Not Currently   Drug use: Never     Allergies   Penicillins, Sulfa antibiotics, Egg-derived products, Gluten meal, Other, Peanut-containing drug products, and Soy allergy  (obsolete)   Review of Systems Review of Systems  HENT:  Positive for congestion, sinus pressure and sinus pain.      Physical Exam Triage Vital Signs ED Triage Vitals  Encounter Vitals Group     BP 08/10/24 1112 (!) 127/90     Girls Systolic BP Percentile --      Girls Diastolic BP Percentile --      Boys Systolic BP Percentile --      Boys Diastolic BP Percentile --      Pulse Rate 08/10/24 1112 73     Resp 08/10/24 1112 19     Temp 08/10/24 1112 98.7 F (37.1 C)     Temp src --      SpO2 08/10/24 1112 98 %     Weight --      Height --      Head Circumference --      Peak Flow --      Pain Score 08/10/24 1111 7     Pain Loc --      Pain Education --      Exclude from Growth Chart --    No data found.  Updated Vital Signs BP (!) 127/90   Pulse 73   Temp 98.7 F (37.1 C)   Resp 19   SpO2 98%   Visual Acuity Right Eye Distance:   Left Eye Distance:   Bilateral Distance:    Right Eye Near:   Left Eye Near:    Bilateral Near:     Physical Exam Vitals and nursing note reviewed.  Constitutional:      Appearance: Normal appearance. She is obese. She is ill-appearing.  HENT:     Head: Normocephalic and atraumatic.     Right Ear: Tympanic membrane and external ear normal.     Left Ear: Tympanic membrane and external ear normal.     Ears:     Comments: Moderate eustachian tube dysfunction noted bilaterally    Nose:     Right Sinus: Maxillary sinus tenderness and frontal sinus tenderness present.     Mouth/Throat:  Mouth: Mucous membranes are moist.     Pharynx: Oropharynx is clear.  Eyes:      Extraocular Movements: Extraocular movements intact.     Pupils: Pupils are equal, round, and reactive to light.  Cardiovascular:     Rate and Rhythm: Normal rate and regular rhythm.     Pulses: Normal pulses.     Heart sounds: Normal heart sounds.  Pulmonary:     Effort: Pulmonary effort is normal.     Breath sounds: Normal breath sounds. No wheezing, rhonchi or rales.  Musculoskeletal:        General: Normal range of motion.  Skin:    General: Skin is warm and dry.  Neurological:     General: No focal deficit present.     Mental Status: She is alert and oriented to person, place, and time. Mental status is at baseline.  Psychiatric:        Mood and Affect: Mood normal.        Behavior: Behavior normal.      UC Treatments / Results  Labs (all labs ordered are listed, but only abnormal results are displayed) Labs Reviewed - No data to display  EKG   Radiology No results found.  Procedures Procedures (including critical care time)  Medications Ordered in UC Medications - No data to display  Initial Impression / Assessment and Plan / UC Course  I have reviewed the triage vital signs and the nursing notes.  Pertinent labs & imaging results that were available during my care of the patient were reviewed by me and considered in my medical decision making (see chart for details).     DM: 1.  Acute maxillary sinusitis, recurrence not specified-Rx doxycycline  100 mg capsule: Take 1 capsule twice daily x 7 days; 2.  Nasal sinus congestion-Rx prednisone  20 mg tablet: Take 3 tablets p.o. daily x 5 days. Advised patient to take medication as directed with food to completion.  Advised patient to take prednisone  with first dose of doxycycline  for the next 5 of 7 days.  Encouraged increase daily water intake to 64 ounces per day while taking this medication.  Advised if symptoms worsen and/or unresolved please follow-up with your PCP or here for further evaluation.  Patient discharged  home, hemodynamically stable. Final Clinical Impressions(s) / UC Diagnoses   Final diagnoses:  Acute maxillary sinusitis, recurrence not specified  Nasal sinus congestion     Discharge Instructions      Advised patient to take medication as directed with food to completion.  Advised patient to take prednisone  with first dose of doxycycline  for the next 5 of 7 days.  Encouraged increase daily water intake to 64 ounces per day while taking this medication.  Advised if symptoms worsen and/or unresolved please follow-up with your PCP or here for further evaluation.     ED Prescriptions     Medication Sig Dispense Auth. Provider   doxycycline  (VIBRAMYCIN ) 100 MG capsule Take 1 capsule (100 mg total) by mouth 2 (two) times daily for 7 days. 14 capsule Tylon Kemmerling, FNP   predniSONE  (DELTASONE ) 20 MG tablet Take 3 tabs PO daily x 5 days. 15 tablet Delno Blaisdell, FNP      PDMP not reviewed this encounter.   Teddy Sharper, FNP 08/10/24 1217

## 2024-08-10 NOTE — Discharge Instructions (Addendum)
 Advised patient to take medication as directed with food to completion.  Advised patient to take prednisone  with first dose of doxycycline  for the next 5 of 7 days.  Encouraged increase daily water intake to 64 ounces per day while taking this medication.  Advised if symptoms worsen and/or unresolved please follow-up with your PCP or here for further evaluation.

## 2024-08-10 NOTE — ED Triage Notes (Signed)
 Pt presents to uc with co nasal gtt for one week. Pt reports yesterday she started having a headache and sinus pressure on the right side. Pt reports no otc medications.

## 2024-08-13 DIAGNOSIS — F411 Generalized anxiety disorder: Secondary | ICD-10-CM | POA: Diagnosis not present

## 2024-08-15 ENCOUNTER — Encounter: Payer: Self-pay | Admitting: Family Medicine

## 2024-08-15 DIAGNOSIS — H6991 Unspecified Eustachian tube disorder, right ear: Secondary | ICD-10-CM

## 2024-08-16 ENCOUNTER — Encounter (INDEPENDENT_AMBULATORY_CARE_PROVIDER_SITE_OTHER): Payer: Self-pay

## 2024-08-20 ENCOUNTER — Ambulatory Visit
Admission: EM | Admit: 2024-08-20 | Discharge: 2024-08-20 | Disposition: A | Discharge status: Home / Self Care | Attending: Family Medicine | Admitting: Family Medicine

## 2024-08-20 ENCOUNTER — Other Ambulatory Visit: Payer: Self-pay

## 2024-08-20 DIAGNOSIS — R42 Dizziness and giddiness: Secondary | ICD-10-CM | POA: Diagnosis not present

## 2024-08-20 DIAGNOSIS — H6993 Unspecified Eustachian tube disorder, bilateral: Secondary | ICD-10-CM | POA: Diagnosis not present

## 2024-08-20 MED ORDER — MECLIZINE HCL 25 MG PO TABS: 25.0000 mg | ORAL_TABLET | Freq: Three times a day (TID) | ORAL | 0 refills | Status: AC | PRN

## 2024-08-20 NOTE — ED Provider Notes (Signed)
 [facility]    CSN: [id number] Arrival date & time: 08/20/24  1625      History   Chief Complaint No chief complaint on file.   HPI Tara Rosales is a 43 y.o. female presents for ear fullness and dizziness.  Patient was seen in urgent care on September 6 and treated for a sinus infection with doxycycline  and prednisone .  She completed this treatment but states she still having ear pressure and yesterday when she bent over to empty the dishwasher as well as when she leaned back in the shower to wash her hair she had dizziness that she describes as the room is spinning.  No syncope vomiting.  No ear pain.  Did reach out to her PCP who is placed referral for ENT but and she is waiting to hear back regarding that.  No other concerns at this time.  HPI  Past Medical History:  Diagnosis Date   Abnormal uterine bleeding (AUB)    Allergic rhinitis    Allergy     History of cervical dysplasia    s/p  LEEP  in 2004    in gyn office   Hypothyroidism due to Hashimoto's thyroiditis 09/19/2016   pcp and endocrinologist-- carmelita longs NP   Myofascial pain syndrome 10/14/2022   Positive ANA (antinuclear antibody) 10/14/2022   work-up done by rheumologist--- dr s. dolphus   Wears glasses     Patient Active Problem List   Diagnosis Date Noted   Abnormal uterine bleeding 11/16/2022   Hypothyroidism 09/19/2016   Vitamin D  deficiency 03/26/2015   Abnormal Pap smear of cervix 07/24/2014    Class: History of   Asthma 05/07/2011    Past Surgical History:  Procedure Laterality Date   BREAST REDUCTION SURGERY Bilateral 1999   CERVICAL BIOPSY  W/ LOOP ELECTRODE EXCISION  2004   DILITATION & CURRETTAGE/HYSTROSCOPY WITH NOVASURE ABLATION N/A 11/16/2022   Procedure: DILATATION & CURETTAGE/HYSTEROSCOPY WITH NOVASURE ABLATION;  Surgeon: Jeralyn Crutch, MD;  Location: [facility];  Service: Gynecology;  Laterality: N/A;   LYMPH NODE DISSECTION  2001   per pt under  chin for enlargement,  benign   TONSILLECTOMY AND ADENOIDECTOMY  1992   child   WISDOM TOOTH EXTRACTION  1995    OB History     Gravida  0   Para  0   Term  0   Preterm  0   AB  0   Living  0      SAB  0   IAB  0   Ectopic  0   Multiple  0   Live Births               Home Medications    Prior to Admission medications   Medication Sig Start Date End Date Taking? Authorizing Provider  meclizine  (ANTIVERT ) 25 MG tablet Take 1 tablet (25 mg total) by mouth 3 (three) times daily as needed for dizziness. 08/20/24  Yes Billy Rocco, Jodi R, NP  calcium carbonate (OSCAL) 1500 (600 Ca) MG TABS tablet Take 300 mg by mouth 2 (two) times daily with a meal.    [provider]  cetirizine (ZYRTEC) 5 MG chewable tablet Chew 5 mg by mouth daily.    [provider]  cholecalciferol (VITAMIN D3) 25 MCG (1000 UNIT) tablet Take 6,000 Units by mouth daily.    [provider]  EPINEPHrine  (EPIPEN  2-PAK) 0.3 mg/0.3 mL IJ SOAJ injection Inject 0.3 mg into the muscle as needed for anaphylaxis.  10/16/23   Frann Mabel Mt, DO  levothyroxine  (SYNTHROID ) 75 MCG tablet Take 1 tablet (75 mcg total) by mouth daily. 11/27/23   Frann Mabel Mt, DO  liothyronine  (CYTOMEL ) 5 MCG tablet Take 1 tablet (5 mcg total) by mouth 2 (two) times daily. 04/17/24   Frann Mabel Mt, DO  meloxicam  (MOBIC ) 15 MG tablet Take 1 tablet (15 mg total) by mouth daily. 06/14/24   Frann Mabel Mt, DO  mometasone  (NASONEX ) 50 MCG/ACT nasal spray Place 2 sprays into the nose daily. 05/20/24   Frann Mabel Mt, DO  Multiple Vitamin (MULTI-VITAMIN) tablet Take 1 tablet by mouth. 11/06/17   [provider]  predniSONE  (DELTASONE ) 20 MG tablet Take 3 tabs PO daily x 5 days. 08/10/24   Teddy Sharper, FNP    Family History Family History  Problem Relation Age of Onset   Colon polyps Mother    Hypertension Mother    Colon polyps Father    Hypertension Father     Cancer Father        prostate   Stroke Father    Rectal cancer Maternal Grandmother    Cancer Maternal Grandmother        rectal   Diabetes Maternal Grandfather    Breast cancer Paternal Grandmother 14   Heart attack Paternal Grandfather    Colon cancer Neg Hx    Esophageal cancer Neg Hx    Stomach cancer Neg Hx     Social History Social History   Tobacco Use   Smoking status: Never    Passive exposure: Never   Smokeless tobacco: Never  Vaping Use   Vaping status: Never Used  Substance Use Topics   Alcohol use: Not Currently   Drug use: Never     Allergies   Penicillins, Sulfa antibiotics, Egg-derived products, Gluten meal, Other, Peanut-containing drug products, and Soy allergy  (obsolete)   Review of Systems Review of Systems  HENT:         Ear pressure  Neurological:  Positive for dizziness.     Physical Exam Triage Vital Signs ED Triage Vitals  Encounter Vitals Group     BP 08/20/24 1640 113/74     Girls Systolic BP Percentile --      Girls Diastolic BP Percentile --      Boys Systolic BP Percentile --      Boys Diastolic BP Percentile --      Pulse Rate 08/20/24 1640 84     Resp 08/20/24 1640 16     Temp 08/20/24 1640 98.9 F (37.2 C)     Temp Source 08/20/24 1640 Oral     SpO2 08/20/24 1640 98 %     Weight --      Height --      Head Circumference --      Peak Flow --      Pain Score 08/20/24 1638 5     Pain Loc --      Pain Education --      Exclude from Growth Chart --    No data found.  Updated Vital Signs BP 113/74   Pulse 84   Temp 98.9 F (37.2 C) (Oral)   Resp 16   SpO2 98%   Visual Acuity Right Eye Distance:   Left Eye Distance:   Bilateral Distance:    Right Eye Near:   Left Eye Near:    Bilateral Near:     Physical Exam Vitals and nursing note reviewed.  Constitutional:  General: She is not in acute distress.    Appearance: Normal appearance. She is not ill-appearing.  HENT:     Head: Normocephalic and  atraumatic.     Right Ear: A middle ear effusion is present. Tympanic membrane is not erythematous.     Left Ear: A middle ear effusion is present. Tympanic membrane is not erythematous.  Eyes:     Pupils: Pupils are equal, round, and reactive to light.  Cardiovascular:     Rate and Rhythm: Normal rate.  Pulmonary:     Effort: Pulmonary effort is normal.  Skin:    General: Skin is warm and dry.  Neurological:     General: No focal deficit present.     Mental Status: She is alert and oriented to person, place, and time.  Psychiatric:        Mood and Affect: Mood normal.        Behavior: Behavior normal.      UC Treatments / Results  Labs (all labs ordered are listed, but only abnormal results are displayed) Labs Reviewed - No data to display  EKG   Radiology No results found.  Procedures Procedures (including critical care time)  Medications Ordered in UC Medications - No data to display  Initial Impression / Assessment and Plan / UC Course  I have reviewed the triage vital signs and the nursing notes.  Pertinent labs & imaging results that were available during my care of the patient were reviewed by me and considered in my medical decision making (see chart for details).     Reviewed exam and symptoms with patient.  No red flags.  Discussed eustachian tube dysfunction and vertigo.  Patient has Flonase  at home and encouraged her to start this daily.  As she was just on prednisone  will have her do over-the-counter Sudafed.  Will send in meclizine  for the vertigo symptoms.  Advised her to follow-up regarding her ENT referral and see her PCP if her symptoms or not improving.  ER precautions reviewed. Final Clinical Impressions(s) / UC Diagnoses   Final diagnoses:  Eustachian tube dysfunction, bilateral  Vertigo     Discharge Instructions      Start your Flonase  daily at home.  Also get over-the-counter Sudafed and take this to help dry up the fluid from behind  your ears.  You may take meclizine  3 times a day as needed for your vertigo symptoms.  Please follow-up with your PCP or ear nose and throat if symptoms do not improve.  Please go to the ER for any worsening symptoms.  Hope you feel better soon!     ED Prescriptions     Medication Sig Dispense Auth. Provider   meclizine  (ANTIVERT ) 25 MG tablet Take 1 tablet (25 mg total) by mouth 3 (three) times daily as needed for dizziness. 15 tablet Mahaila Tischer, Jodi R, NP      PDMP not reviewed this encounter.   Loreda Myla SAUNDERS, NP 08/20/24 [phone]

## 2024-08-20 NOTE — Discharge Instructions (Signed)
 Start your Flonase  daily at home.  Also get over-the-counter Sudafed and take this to help dry up the fluid from behind your ears.  You may take meclizine  3 times a day as needed for your vertigo symptoms.  Please follow-up with your PCP or ear nose and throat if symptoms do not improve.  Please go to the ER for any worsening symptoms.  Hope you feel better soon!

## 2024-08-20 NOTE — ED Triage Notes (Signed)
 Pt states as she was bent over she was dizzy and when she took a shower and layed her head back she got dizzy. PT c/o pressure in ears bilat. Pt waiting on referral with ENT office.

## 2024-08-21 ENCOUNTER — Other Ambulatory Visit: Payer: Self-pay | Admitting: Family Medicine

## 2024-08-22 ENCOUNTER — Other Ambulatory Visit (HOSPITAL_BASED_OUTPATIENT_CLINIC_OR_DEPARTMENT_OTHER): Payer: Self-pay

## 2024-08-22 ENCOUNTER — Other Ambulatory Visit: Payer: Self-pay

## 2024-08-22 MED ORDER — LEVOTHYROXINE SODIUM 75 MCG PO TABS
75.0000 ug | ORAL_TABLET | Freq: Every day | ORAL | 0 refills | Status: DC
  Filled 2024-08-22: qty 90, 90d supply, fill #0

## 2024-08-27 DIAGNOSIS — F411 Generalized anxiety disorder: Secondary | ICD-10-CM | POA: Diagnosis not present

## 2024-08-28 NOTE — Telephone Encounter (Signed)
 Can you refer to somewhere else

## 2024-09-03 DIAGNOSIS — H938X3 Other specified disorders of ear, bilateral: Secondary | ICD-10-CM | POA: Diagnosis not present

## 2024-09-03 DIAGNOSIS — R42 Dizziness and giddiness: Secondary | ICD-10-CM | POA: Diagnosis not present

## 2024-09-09 ENCOUNTER — Encounter: Payer: Self-pay | Admitting: Family Medicine

## 2024-09-09 ENCOUNTER — Ambulatory Visit: Admitting: Family Medicine

## 2024-09-09 VITALS — BP 128/82 | HR 95 | Temp 97.3°F | Resp 16 | Ht 69.0 in | Wt 240.6 lb

## 2024-09-09 DIAGNOSIS — M722 Plantar fascial fibromatosis: Secondary | ICD-10-CM

## 2024-09-09 MED ORDER — METHYLPREDNISOLONE ACETATE 40 MG/ML IJ SUSP
40.0000 mg | Freq: Once | INTRAMUSCULAR | Status: AC
  Administered 2024-09-09: 40 mg via INTRAMUSCULAR

## 2024-09-09 NOTE — Progress Notes (Signed)
 Musculoskeletal Exam  Patient: Tara Rosales DOB: [birth year]  DOS: 09/09/2024  SUBJECTIVE:  Chief Complaint:   Chief Complaint  Patient presents with   Foot Injury    Heel Pain    Tara Rosales is a 43 y.o.  female for evaluation and treatment of R heel pain.   Onset:  7 months ago. No inj or change in activity.  Location: outside heel Character:  sharp  Progression of issue:  has worsened Associated symptoms: worse in AM Treatment: to date has been rest, ice, OTC NSAIDS, prescription NSAIDS, and [place] sock.   Neurovascular symptoms: no  Past Medical History:  Diagnosis Date   Abnormal uterine bleeding (AUB)    Allergic rhinitis    Allergy     History of cervical dysplasia    s/p  LEEP  in 2004    in gyn office   Hypothyroidism due to Hashimoto's thyroiditis 09/19/2016   pcp and endocrinologist-- carmelita longs NP   Myofascial pain syndrome 10/14/2022   Positive ANA (antinuclear antibody) 10/14/2022   work-up done by rheumologist--- dr s. dolphus   Wears glasses     Objective: VITAL SIGNS: BP 128/82 (BP Location: Left Arm, Patient Position: Sitting)   Pulse 95   Temp (!) 97.3 F (36.3 C) (Oral)   Resp 16   Ht 5' 9 (1.753 m)   Wt 240 lb 9.6 oz (109.1 kg)   SpO2 97%   BMI 35.53 kg/m  Constitutional: Well formed, well developed. No acute distress. Thorax & Lungs: No accessory muscle use Musculoskeletal: R Foot.   Tenderness to palpation: yes over prox insertion of PF Deformity: no Ecchymosis: no Neurologic: Normal sensory function. No focal deficits noted. DTR's equal and symmetric in LE's. No clonus. Psychiatric: Normal mood. Age appropriate judgment and insight. Alert & oriented x 3.    Procedure note; plantar fascia injection Verbal consent obtained The area of interest was palpated and marked with an otoscope speculum It was then cleaned with alcohol x1 Free spray was then used for topical anesthesia 20 mg of Depo-Medrol  with 1 mL of 2%  lidocaine  without epinephrine  was injected with a 30-gauge needle The area was then bandaged The patient tolerated the procedure well There were no immediate complications noted  Assessment:  Plantar fasciitis - Plan: PR INJECTION 1 TENDON SHEATH/LIGAMENT APONEUROSIS  Plan: Stretches/exercises, heat, ice, Tylenol . Consider specialty referral if no sig improvement in the next week.   F/u as originally scheduled. The patient voiced understanding and agreement to the plan.   Tara [place], DO 09/09/24  2:25 PM

## 2024-09-09 NOTE — Addendum Note (Signed)
 Addended by: Griselda Tosh M on: 09/09/2024 02:57 PM   Modules accepted: Orders

## 2024-09-09 NOTE — Patient Instructions (Addendum)
 Ice/cold pack over area for 10-15 min twice daily.  OK to take Tylenol  1000 mg (2 extra strength tabs) or 975 mg (3 regular strength tabs) every 6 hours as needed.  Continue the stretches/exercises for the foot.  Send me a message in 1 week if no better.  Let us  know if you need anything.

## 2024-09-11 DIAGNOSIS — F331 Major depressive disorder, recurrent, moderate: Secondary | ICD-10-CM | POA: Diagnosis not present

## 2024-09-11 DIAGNOSIS — F411 Generalized anxiety disorder: Secondary | ICD-10-CM | POA: Diagnosis not present

## 2024-09-11 DIAGNOSIS — F4320 Adjustment disorder, unspecified: Secondary | ICD-10-CM | POA: Diagnosis not present

## 2024-09-11 DIAGNOSIS — F4312 Post-traumatic stress disorder, chronic: Secondary | ICD-10-CM | POA: Diagnosis not present

## 2024-09-13 ENCOUNTER — Other Ambulatory Visit (HOSPITAL_BASED_OUTPATIENT_CLINIC_OR_DEPARTMENT_OTHER): Payer: Self-pay

## 2024-09-13 MED ORDER — COMIRNATY 30 MCG/0.3ML IM SUSY
0.3000 mL | PREFILLED_SYRINGE | Freq: Once | INTRAMUSCULAR | 0 refills | Status: AC
Start: 2024-09-13 — End: 2024-09-14
  Filled 2024-09-13: qty 0.3, 1d supply, fill #0

## 2024-09-25 DIAGNOSIS — F4320 Adjustment disorder, unspecified: Secondary | ICD-10-CM | POA: Diagnosis not present

## 2024-09-25 DIAGNOSIS — F331 Major depressive disorder, recurrent, moderate: Secondary | ICD-10-CM | POA: Diagnosis not present

## 2024-09-25 DIAGNOSIS — F411 Generalized anxiety disorder: Secondary | ICD-10-CM | POA: Diagnosis not present

## 2024-09-27 ENCOUNTER — Other Ambulatory Visit (HOSPITAL_BASED_OUTPATIENT_CLINIC_OR_DEPARTMENT_OTHER): Payer: Self-pay

## 2024-09-27 MED ORDER — FLUZONE 0.5 ML IM SUSY
0.5000 mL | PREFILLED_SYRINGE | Freq: Once | INTRAMUSCULAR | 0 refills | Status: AC
  Filled 2024-09-27: qty 0.5, 1d supply, fill #0

## 2024-10-07 ENCOUNTER — Encounter: Payer: Self-pay | Admitting: Radiology

## 2024-10-08 ENCOUNTER — Other Ambulatory Visit (HOSPITAL_BASED_OUTPATIENT_CLINIC_OR_DEPARTMENT_OTHER): Payer: Self-pay

## 2024-10-08 ENCOUNTER — Other Ambulatory Visit: Payer: Self-pay | Admitting: Family Medicine

## 2024-10-08 MED ORDER — LEVOTHYROXINE SODIUM 75 MCG PO TABS
75.0000 ug | ORAL_TABLET | Freq: Every day | ORAL | 1 refills | Status: AC
  Filled 2024-10-08: qty 90, 90d supply, fill #0
  Filled 2024-11-22 – 2024-11-25 (×3): qty 30, 30d supply, fill #0
  Filled 2024-12-23: qty 30, 30d supply, fill #1

## 2024-10-09 DIAGNOSIS — F411 Generalized anxiety disorder: Secondary | ICD-10-CM | POA: Diagnosis not present

## 2024-10-09 DIAGNOSIS — F331 Major depressive disorder, recurrent, moderate: Secondary | ICD-10-CM | POA: Diagnosis not present

## 2024-10-09 DIAGNOSIS — F4320 Adjustment disorder, unspecified: Secondary | ICD-10-CM | POA: Diagnosis not present

## 2024-10-09 DIAGNOSIS — F4312 Post-traumatic stress disorder, chronic: Secondary | ICD-10-CM | POA: Diagnosis not present

## 2024-11-22 ENCOUNTER — Other Ambulatory Visit (HOSPITAL_BASED_OUTPATIENT_CLINIC_OR_DEPARTMENT_OTHER): Payer: Self-pay

## 2024-11-25 ENCOUNTER — Other Ambulatory Visit (HOSPITAL_BASED_OUTPATIENT_CLINIC_OR_DEPARTMENT_OTHER): Payer: Self-pay

## 2024-11-25 ENCOUNTER — Telehealth: Payer: Self-pay

## 2024-11-25 NOTE — Telephone Encounter (Signed)
 Levorn spoke with Per Phill at [facility] pharmacy, there is no issues with Cytomel  (liothyronine  sodium).

## 2024-11-25 NOTE — Telephone Encounter (Signed)
 Can we reach out to pharmacy to see what alternatives they recommend? Thx.

## 2024-11-25 NOTE — Telephone Encounter (Signed)
 Per Phill at [facility] pharmacy, there is no issues with Cytomel  (liothyronine  sodium).  A request had been made for Levothyroxine  due to change in manufacturer (this has been resolved) but no changes on Cytomel  and, it is available.  Patient will received medications as prescribed.

## 2024-11-25 NOTE — Telephone Encounter (Signed)
 Pharmacist downstairs called liothyronine  has been discontinued. Please advise?

## 2024-11-25 NOTE — Telephone Encounter (Signed)
 Copied from CRM #[id number]. Topic: Clinical - Prescription Issue >> Nov 25, 2024 12:58 PM [name] ORN wrote: Reason for CRM: Tara Rosales [facility] Call Center  Patient was taking generic Levothyroxcine that has completely discontinued. Requesting permission to speak to a different manufacturer.  Phone: [phone]

## 2024-12-23 ENCOUNTER — Other Ambulatory Visit: Payer: Self-pay
